# Patient Record
Sex: Female | Born: 1995 | Hispanic: Yes | Marital: Married | Smoking: Never smoker
Health system: Southern US, Community
[De-identification: ages and names within clinical notes are randomized; demographics above are authoritative.]

## PROBLEM LIST (undated history)

## (undated) DIAGNOSIS — R519 Headache, unspecified: Secondary | ICD-10-CM

## (undated) DIAGNOSIS — J45909 Unspecified asthma, uncomplicated: Secondary | ICD-10-CM

## (undated) DIAGNOSIS — K219 Gastro-esophageal reflux disease without esophagitis: Secondary | ICD-10-CM

## (undated) DIAGNOSIS — D649 Anemia, unspecified: Secondary | ICD-10-CM

## (undated) DIAGNOSIS — D696 Thrombocytopenia, unspecified: Secondary | ICD-10-CM

---
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
## 2016-02-06 LAB — OB RESULTS CONSOLE GC/CHLAMYDIA
CHLAMYDIA, DNA PROBE: NEGATIVE
Gonorrhea: NEGATIVE

## 2016-02-06 LAB — OB RESULTS CONSOLE GBS: GBS: NEGATIVE

## 2016-03-01 ENCOUNTER — Inpatient Hospital Stay: Payer: Medicaid Other | Admitting: Certified Registered Nurse Anesthetist

## 2016-03-01 ENCOUNTER — Encounter: Payer: Self-pay | Admitting: Obstetrics & Gynecology

## 2016-03-01 ENCOUNTER — Inpatient Hospital Stay
Admission: EM | Admit: 2016-03-01 | Discharge: 2016-03-04 | DRG: 766 | Disposition: A | Discharge status: Home / Self Care | Payer: Medicaid Other | Attending: Obstetrics & Gynecology | Admitting: Obstetrics & Gynecology

## 2016-03-01 ENCOUNTER — Encounter
Admission: EM | Disposition: A | Discharge status: Home / Self Care | Payer: Self-pay | Source: Home / Self Care | Attending: Obstetrics & Gynecology

## 2016-03-01 DIAGNOSIS — O9952 Diseases of the respiratory system complicating childbirth: Secondary | ICD-10-CM | POA: Diagnosis present

## 2016-03-01 DIAGNOSIS — O9902 Anemia complicating childbirth: Secondary | ICD-10-CM | POA: Diagnosis present

## 2016-03-01 DIAGNOSIS — D649 Anemia, unspecified: Secondary | ICD-10-CM | POA: Diagnosis present

## 2016-03-01 DIAGNOSIS — Z3A39 39 weeks gestation of pregnancy: Secondary | ICD-10-CM | POA: Diagnosis not present

## 2016-03-01 DIAGNOSIS — J45909 Unspecified asthma, uncomplicated: Secondary | ICD-10-CM | POA: Diagnosis present

## 2016-03-01 HISTORY — DX: Anemia, unspecified: D64.9

## 2016-03-01 LAB — CBC
HCT: 31.9 % — ABNORMAL LOW (ref 35.0–47.0)
Hemoglobin: 10.2 g/dL — ABNORMAL LOW (ref 12.0–16.0)
MCH: 25.4 pg — AB (ref 26.0–34.0)
MCHC: 32 g/dL (ref 32.0–36.0)
MCV: 79.2 fL — ABNORMAL LOW (ref 80.0–100.0)
PLATELETS: 208 10*3/uL (ref 150–440)
RBC: 4.02 MIL/uL (ref 3.80–5.20)
RDW: 15.4 % — ABNORMAL HIGH (ref 11.5–14.5)
WBC: 7.9 10*3/uL (ref 3.6–11.0)

## 2016-03-01 LAB — TYPE AND SCREEN
ABO/RH(D): O POS
Antibody Screen: NEGATIVE

## 2016-03-01 LAB — ABO/RH: ABO/RH(D): O POS

## 2016-03-01 LAB — CHLAMYDIA/NGC RT PCR (ARMC ONLY)
CHLAMYDIA TR: NOT DETECTED
N gonorrhoeae: NOT DETECTED

## 2016-03-01 SURGERY — Surgical Case
Anesthesia: Spinal

## 2016-03-01 MED ORDER — FERROUS SULFATE 325 (65 FE) MG PO TABS
325.0000 mg | ORAL_TABLET | Freq: Two times a day (BID) | ORAL | Status: DC
  Administered 2016-03-02 – 2016-03-04 (×5): 325 mg via ORAL
  Filled 2016-03-01 (×5): qty 1

## 2016-03-01 MED ORDER — BUPIVACAINE HCL (PF) 0.5 % IJ SOLN
5.0000 mL | Freq: Once | INTRAMUSCULAR | Status: DC
  Administered 2016-03-01: 5 mL

## 2016-03-01 MED ORDER — OXYTOCIN 10 UNIT/ML IJ SOLN
INTRAMUSCULAR | Status: AC
  Filled 2016-03-01: qty 2

## 2016-03-01 MED ORDER — AMMONIA AROMATIC IN INHA
RESPIRATORY_TRACT | Status: AC
  Filled 2016-03-01: qty 10

## 2016-03-01 MED ORDER — OXYCODONE-ACETAMINOPHEN 5-325 MG PO TABS
2.0000 | ORAL_TABLET | ORAL | Status: DC | PRN
  Administered 2016-03-03 – 2016-03-04 (×8): 2 via ORAL
  Filled 2016-03-01 (×8): qty 2

## 2016-03-01 MED ORDER — FENTANYL CITRATE (PF) 100 MCG/2ML IJ SOLN
INTRAMUSCULAR | Status: DC | PRN
  Administered 2016-03-01: 150 ug via INTRAVENOUS

## 2016-03-01 MED ORDER — WITCH HAZEL-GLYCERIN EX PADS: 1.0000 "application " | MEDICATED_PAD | CUTANEOUS | Status: DC | PRN

## 2016-03-01 MED ORDER — KETOROLAC TROMETHAMINE 30 MG/ML IJ SOLN: 30.0000 mg | Freq: Four times a day (QID) | INTRAMUSCULAR | Status: AC | PRN

## 2016-03-01 MED ORDER — SCOPOLAMINE 1 MG/3DAYS TD PT72
1.0000 | MEDICATED_PATCH | Freq: Once | TRANSDERMAL | Status: DC
  Administered 2016-03-02: 1.5 mg via TRANSDERMAL
  Filled 2016-03-01: qty 1

## 2016-03-01 MED ORDER — SENNOSIDES-DOCUSATE SODIUM 8.6-50 MG PO TABS
2.0000 | ORAL_TABLET | ORAL | Status: DC
  Administered 2016-03-02 – 2016-03-03 (×2): 2 via ORAL
  Filled 2016-03-01 (×2): qty 2

## 2016-03-01 MED ORDER — LACTATED RINGERS IV SOLN
INTRAVENOUS | Status: DC
  Administered 2016-03-01 (×3): via INTRAVENOUS

## 2016-03-01 MED ORDER — BUPIVACAINE 0.25 % ON-Q PUMP DUAL CATH 400 ML
400.0000 mL | INJECTION | Status: DC
  Filled 2016-03-01: qty 400

## 2016-03-01 MED ORDER — CITRIC ACID-SODIUM CITRATE 334-500 MG/5ML PO SOLN: 30.0000 mL | ORAL | Status: DC

## 2016-03-01 MED ORDER — OXYCODONE-ACETAMINOPHEN 5-325 MG PO TABS
1.0000 | ORAL_TABLET | ORAL | Status: DC | PRN
  Administered 2016-03-02: 1 via ORAL
  Filled 2016-03-01: qty 1

## 2016-03-01 MED ORDER — MISOPROSTOL 200 MCG PO TABS
ORAL_TABLET | ORAL | Status: AC
Start: 2016-03-01 — End: 2016-03-02
  Filled 2016-03-01: qty 4

## 2016-03-01 MED ORDER — BUPIVACAINE HCL (PF) 0.75 % IJ SOLN
INTRAMUSCULAR | Status: DC | PRN
  Administered 2016-03-01: 12 mg

## 2016-03-01 MED ORDER — NALBUPHINE HCL 10 MG/ML IJ SOLN
5.0000 mg | Freq: Once | INTRAMUSCULAR | Status: DC | PRN
Start: 2016-03-01 — End: 2016-03-01

## 2016-03-01 MED ORDER — MORPHINE SULFATE (PF) 0.5 MG/ML IJ SOLN
INTRAMUSCULAR | Status: DC | PRN
  Administered 2016-03-01: .2 mg via EPIDURAL

## 2016-03-01 MED ORDER — NALOXONE HCL 0.4 MG/ML IJ SOLN: 0.4000 mg | INTRAMUSCULAR | Status: DC | PRN

## 2016-03-01 MED ORDER — LACTATED RINGERS IV SOLN: 500.0000 mL | INTRAVENOUS | Status: DC | PRN

## 2016-03-01 MED ORDER — DEXTROSE 5 % IV SOLN
500.0000 mg | INTRAVENOUS | Status: AC
  Administered 2016-03-01: 500 mg via INTRAVENOUS
  Filled 2016-03-01: qty 500

## 2016-03-01 MED ORDER — IBUPROFEN 600 MG PO TABS
600.0000 mg | ORAL_TABLET | Freq: Four times a day (QID) | ORAL | Status: DC
  Administered 2016-03-02 – 2016-03-04 (×10): 600 mg via ORAL
  Filled 2016-03-01 (×10): qty 1

## 2016-03-01 MED ORDER — ONDANSETRON HCL 4 MG/2ML IJ SOLN: 4.0000 mg | Freq: Once | INTRAMUSCULAR | Status: DC | PRN

## 2016-03-01 MED ORDER — CITRIC ACID-SODIUM CITRATE 334-500 MG/5ML PO SOLN
ORAL | Status: AC
  Filled 2016-03-01: qty 15

## 2016-03-01 MED ORDER — SIMETHICONE 80 MG PO CHEW
80.0000 mg | CHEWABLE_TABLET | Freq: Three times a day (TID) | ORAL | Status: DC
  Administered 2016-03-02 – 2016-03-04 (×5): 80 mg via ORAL
  Filled 2016-03-01 (×5): qty 1

## 2016-03-01 MED ORDER — NALBUPHINE HCL 10 MG/ML IJ SOLN: 5.0000 mg | INTRAMUSCULAR | Status: DC | PRN

## 2016-03-01 MED ORDER — SODIUM CHLORIDE 0.9% FLUSH: 3.0000 mL | INTRAVENOUS | Status: DC | PRN

## 2016-03-01 MED ORDER — LACTATED RINGERS IV SOLN
INTRAVENOUS | Status: DC
  Administered 2016-03-01 – 2016-03-02 (×2): via INTRAVENOUS

## 2016-03-01 MED ORDER — DIPHENHYDRAMINE HCL 25 MG PO CAPS: 25.0000 mg | ORAL_CAPSULE | ORAL | Status: DC | PRN

## 2016-03-01 MED ORDER — OXYTOCIN BOLUS FROM INFUSION: 500.0000 mL | INTRAVENOUS | Status: DC

## 2016-03-01 MED ORDER — CEFAZOLIN SODIUM-DEXTROSE 2-4 GM/100ML-% IV SOLN
2.0000 g | INTRAVENOUS | Status: AC
  Administered 2016-03-01: 2 g via INTRAVENOUS

## 2016-03-01 MED ORDER — CEFAZOLIN SODIUM-DEXTROSE 2-4 GM/100ML-% IV SOLN
INTRAVENOUS | Status: AC
  Administered 2016-03-01: 2 g via INTRAVENOUS
  Filled 2016-03-01: qty 100

## 2016-03-01 MED ORDER — BUPIVACAINE HCL (PF) 0.25 % IJ SOLN
INTRAMUSCULAR | Status: DC | PRN
  Administered 2016-03-01 (×2): 4 mL via EPIDURAL

## 2016-03-01 MED ORDER — TERBUTALINE SULFATE 1 MG/ML IJ SOLN: 0.2500 mg | Freq: Once | INTRAMUSCULAR | Status: DC | PRN

## 2016-03-01 MED ORDER — BUPIVACAINE HCL 0.5 % IJ SOLN
INTRAMUSCULAR | Status: DC | PRN
  Administered 2016-03-01: 10 mL

## 2016-03-01 MED ORDER — FENTANYL CITRATE (PF) 100 MCG/2ML IJ SOLN
25.0000 ug | INTRAMUSCULAR | Status: DC | PRN
  Administered 2016-03-01: 25 ug via INTRAVENOUS
  Filled 2016-03-01: qty 2

## 2016-03-01 MED ORDER — COCONUT OIL OIL
1.0000 "application " | TOPICAL_OIL | Status: DC | PRN
  Administered 2016-03-02: 1 via TOPICAL
  Filled 2016-03-01: qty 120

## 2016-03-01 MED ORDER — BUPIVACAINE ON-Q PAIN PUMP (FOR ORDER SET NO CHG)
INJECTION | Status: DC
  Filled 2016-03-01: qty 1

## 2016-03-01 MED ORDER — FENTANYL 2.5 MCG/ML W/ROPIVACAINE 0.2% IN NS 100 ML EPIDURAL INFUSION (ARMC-ANES)
EPIDURAL | Status: DC | PRN
  Administered 2016-03-01: 9 mL/h via EPIDURAL

## 2016-03-01 MED ORDER — OXYTOCIN 10 UNIT/ML IJ SOLN
40.0000 [IU] | INTRAVENOUS | Status: DC | PRN
  Administered 2016-03-01: 20:00:00 via INTRAVENOUS

## 2016-03-01 MED ORDER — FENTANYL 2.5 MCG/ML W/ROPIVACAINE 0.2% IN NS 100 ML EPIDURAL INFUSION (ARMC-ANES): 10.0000 mL/h | EPIDURAL | Status: DC

## 2016-03-01 MED ORDER — KETOROLAC TROMETHAMINE 30 MG/ML IJ SOLN
30.0000 mg | Freq: Four times a day (QID) | INTRAMUSCULAR | Status: AC | PRN
  Administered 2016-03-01 – 2016-03-02 (×2): 30 mg via INTRAVENOUS
  Filled 2016-03-01 (×2): qty 1

## 2016-03-01 MED ORDER — MENTHOL 3 MG MT LOZG: 1.0000 | LOZENGE | OROMUCOSAL | Status: DC | PRN

## 2016-03-01 MED ORDER — BUPIVACAINE IN DEXTROSE 0.75-8.25 % IT SOLN
INTRATHECAL | Status: DC | PRN
  Administered 2016-03-01: 12 mL via INTRATHECAL

## 2016-03-01 MED ORDER — OXYTOCIN 40 UNITS IN LACTATED RINGERS INFUSION - SIMPLE MED
1.0000 m[IU]/min | INTRAVENOUS | Status: DC
  Administered 2016-03-01: 1 m[IU]/min via INTRAVENOUS

## 2016-03-01 MED ORDER — LIDOCAINE HCL (PF) 1 % IJ SOLN: 30.0000 mL | INTRAMUSCULAR | Status: DC | PRN

## 2016-03-01 MED ORDER — BUPIVACAINE HCL (PF) 0.5 % IJ SOLN
5.0000 mL | Freq: Once | INTRAMUSCULAR | Status: DC
  Filled 2016-03-01: qty 30

## 2016-03-01 MED ORDER — DIPHENHYDRAMINE HCL 50 MG/ML IJ SOLN: 12.5000 mg | INTRAMUSCULAR | Status: DC | PRN

## 2016-03-01 MED ORDER — OXYTOCIN 40 UNITS IN LACTATED RINGERS INFUSION - SIMPLE MED: 2.5000 [IU]/h | INTRAVENOUS | Status: AC

## 2016-03-01 MED ORDER — BUTORPHANOL TARTRATE 1 MG/ML IJ SOLN
INTRAMUSCULAR | Status: AC
  Administered 2016-03-01: 1 mg via INTRAVENOUS
  Filled 2016-03-01: qty 1

## 2016-03-01 MED ORDER — BUTORPHANOL TARTRATE 1 MG/ML IJ SOLN
1.0000 mg | INTRAMUSCULAR | Status: DC
  Administered 2016-03-01: 1 mg via INTRAVENOUS

## 2016-03-01 MED ORDER — NALOXONE HCL 2 MG/2ML IJ SOSY
1.0000 ug/kg/h | PREFILLED_SYRINGE | INTRAVENOUS | Status: DC | PRN
  Filled 2016-03-01: qty 2

## 2016-03-01 MED ORDER — MEPERIDINE HCL 25 MG/ML IJ SOLN: 6.2500 mg | INTRAMUSCULAR | Status: DC | PRN

## 2016-03-01 MED ORDER — LIDOCAINE HCL (PF) 1 % IJ SOLN
INTRAMUSCULAR | Status: AC
Start: 2016-03-01 — End: 2016-03-01
  Administered 2016-03-01: 3 mL via INTRADERMAL
  Filled 2016-03-01: qty 30

## 2016-03-01 MED ORDER — PRENATAL MULTIVITAMIN CH
1.0000 | ORAL_TABLET | Freq: Every day | ORAL | Status: DC
  Administered 2016-03-02 – 2016-03-04 (×3): 1 via ORAL
  Filled 2016-03-01 (×3): qty 1

## 2016-03-01 MED ORDER — ACETAMINOPHEN 325 MG PO TABS: 650.0000 mg | ORAL_TABLET | ORAL | Status: DC | PRN

## 2016-03-01 MED ORDER — DIPHENHYDRAMINE HCL 25 MG PO CAPS: 25.0000 mg | ORAL_CAPSULE | Freq: Four times a day (QID) | ORAL | Status: DC | PRN

## 2016-03-01 MED ORDER — NALBUPHINE HCL 10 MG/ML IJ SOLN: 5.0000 mg | Freq: Once | INTRAMUSCULAR | Status: DC | PRN

## 2016-03-01 MED ORDER — CITRIC ACID-SODIUM CITRATE 334-500 MG/5ML PO SOLN: 30.0000 mL | ORAL | Status: DC | PRN

## 2016-03-01 MED ORDER — DIBUCAINE 1 % RE OINT: 1.0000 "application " | TOPICAL_OINTMENT | RECTAL | Status: DC | PRN

## 2016-03-01 MED ORDER — OXYTOCIN 40 UNITS IN LACTATED RINGERS INFUSION - SIMPLE MED
2.5000 [IU]/h | INTRAVENOUS | Status: DC
  Administered 2016-03-01: 800 mL via INTRAVENOUS
  Filled 2016-03-01: qty 1000

## 2016-03-01 MED ORDER — LIDOCAINE-EPINEPHRINE (PF) 1.5 %-1:200000 IJ SOLN
INTRAMUSCULAR | Status: DC | PRN
  Administered 2016-03-01: 3 mL via PERINEURAL

## 2016-03-01 MED ORDER — FENTANYL 2.5 MCG/ML W/ROPIVACAINE 0.2% IN NS 100 ML EPIDURAL INFUSION (ARMC-ANES)
EPIDURAL | Status: AC
  Administered 2016-03-01: 250 ug
  Filled 2016-03-01: qty 100

## 2016-03-01 MED ORDER — ONDANSETRON HCL 4 MG/2ML IJ SOLN
4.0000 mg | Freq: Three times a day (TID) | INTRAMUSCULAR | Status: DC | PRN
  Administered 2016-03-02: 4 mg via INTRAVENOUS
  Filled 2016-03-01: qty 2

## 2016-03-01 MED ORDER — ONDANSETRON HCL 4 MG/2ML IJ SOLN
4.0000 mg | Freq: Four times a day (QID) | INTRAMUSCULAR | Status: DC | PRN
  Administered 2016-03-01: 8 mg via INTRAVENOUS

## 2016-03-01 MED ORDER — MEASLES, MUMPS & RUBELLA VAC ~~LOC~~ INJ: 0.5000 mL | INJECTION | SUBCUTANEOUS | Status: DC | PRN

## 2016-03-01 SURGICAL SUPPLY — 30 items
CANISTER SUCT 3000ML (MISCELLANEOUS) ×2 IMPLANT
CATH KIT ON-Q SILVERSOAK 5IN (CATHETERS) ×4 IMPLANT
DRESSING SURGICEL FIBRLLR 1X2 (HEMOSTASIS) ×1 IMPLANT
DRSG OPSITE POSTOP 4X10 (GAUZE/BANDAGES/DRESSINGS) IMPLANT
DRSG SURGICEL FIBRILLAR 1X2 (HEMOSTASIS) ×2
DRSG TELFA 3X8 NADH (GAUZE/BANDAGES/DRESSINGS) ×2 IMPLANT
ELECT CAUTERY BLADE 6.4 (BLADE) IMPLANT
ELECT REM PT RETURN 9FT ADLT (ELECTROSURGICAL) ×2
ELECTRODE REM PT RTRN 9FT ADLT (ELECTROSURGICAL) ×1 IMPLANT
GAUZE SPONGE 4X4 12PLY STRL (GAUZE/BANDAGES/DRESSINGS) ×2 IMPLANT
GLOVE BIO SURGEON STRL SZ7 (GLOVE) ×6 IMPLANT
GLOVE INDICATOR 7.5 STRL GRN (GLOVE) ×6 IMPLANT
GOWN STRL REUS W/ TWL LRG LVL3 (GOWN DISPOSABLE) ×3 IMPLANT
GOWN STRL REUS W/TWL LRG LVL3 (GOWN DISPOSABLE) ×3
LIQUID BAND (GAUZE/BANDAGES/DRESSINGS) ×2 IMPLANT
NS IRRIG 1000ML POUR BTL (IV SOLUTION) ×2 IMPLANT
PACK C SECTION AR (MISCELLANEOUS) ×2 IMPLANT
PAD OB MATERNITY 4.3X12.25 (PERSONAL CARE ITEMS) ×2 IMPLANT
PAD PREP 24X41 OB/GYN DISP (PERSONAL CARE ITEMS) ×2 IMPLANT
SPONGE LAP 18X18 5 PK (GAUZE/BANDAGES/DRESSINGS) IMPLANT
STRIP CLOSURE SKIN 1/2X4 (GAUZE/BANDAGES/DRESSINGS) ×2 IMPLANT
SUT CHROMIC GUT BROWN 0 54 (SUTURE) ×1 IMPLANT
SUT CHROMIC GUT BROWN 0 54IN (SUTURE) ×2
SUT MNCRL 4-0 (SUTURE) ×1
SUT MNCRL 4-0 27XMFL (SUTURE) ×1
SUT PDS AB 1 TP1 96 (SUTURE) IMPLANT
SUT PLAIN 2 0 XLH (SUTURE) ×2 IMPLANT
SUT VIC AB 0 CT1 36 (SUTURE) ×8 IMPLANT
SUTURE MNCRL 4-0 27XMF (SUTURE) ×1 IMPLANT
SWABSTK COMLB BENZOIN TINCTURE (MISCELLANEOUS) IMPLANT

## 2016-03-01 NOTE — Op Note (Signed)
Cesarean Section Procedure Note   Nicole FossaCecilia Hartman   03/01/2016   Pre-operative Diagnosis:  1) intrauterine pregnancy at 7333w5d  2) Second stage arrest  Post-operative Diagnosis:  1) intrauterine pregnancy at 9833w5d  2) Second stage arrest  Procedure: Primary Low Transverse Cesarean Section via Pfannenstiel Incision with double layer uterine closure  Surgeon: Surgeon(s) and Role:    * Conard NovakStephen D Collyn Selk, MD - Primary   Anesthesia: spinal   Findings:  1) normal appearing gravid uterus, fallopian tubes, and ovaries 2) Viable female infant with APGARs of 5 at 1 minutes and 9 at 5 minutes with weight 3,140 grams (6lb 15 oz)   Estimated Blood Loss: 800 mL  Total IV Fluids: 1,200 ml   Urine Output: 150 mL pink-tinged but clear urine at end of case  Specimens: None  Complications: no complications  Disposition: PACU - hemodynamically stable.   Maternal Condition: stable   Baby condition / location:  Couplet care / Skin to Skin  Procedure Details:  The patient was seen in the Holding Room. The risks, benefits, complications, treatment options, and expected outcomes were discussed with the patient. The patient concurred with the proposed plan, giving informed consent. identified as Nicole Hartman and the procedure verified as C-Section Delivery. A Time Out was held and the above information confirmed.   After induction of anesthesia, the patient was prepped and draped (including a vaginal prep with betadine) in the usual sterile manner. A Pfannenstiel incision was made and carried down through the subcutaneous tissue to the fascia. Fascial incision was made and extended transversely. The fascia was separated from the underlying rectus tissue superiorly and inferiorly. The peritoneum was identified and entered. Peritoneal incision was extended longitudinally. The bladder flap was sharply and bluntly freed from the lower uterine segment. A low transverse uterine incision was made and the  hysterotomy was extended with cranial-caudal tension. With a push up from below, delivered from cephalic presentation was a 3,140 gram Living newborn infant(s) or Female with Apgar scores of 5 at one minute and 9 at five minutes. Cord ph was not sent the umbilical cord was clamped and cut cord blood was obtained for evaluation. The placenta was removed Intact and appeared normal. The uterine outline, tubes and ovaries appeared normal. The uterine incision was closed with running locked sutures of 0 Vicryl.  A second layer of the same suture was thrown in an imbricating fashion.  Hemostasis was assured.  The uterus was returned to the abdomen and the paracolic gutters were cleared of all clots and debris.  Fibrillar was placed along the lower uterine segment to assure continued hemostasis. The peritoneum was reapproximated using 0-Vicryl in a running fashion.  The rectus muscles were inspected and found to be hemostatic.  The On-Q catheter pumps were inserted in accordance with the manufacturer's recommendations.  The catheters were inserted approximately 4cm cephelad to the incision line, approximately 1cm apart, straddling the midline.  They were inserted to a depth of the 4th mark. They were positioned superficial to the rectus abdominus muscles and deep to the rectus fascia.    The fascia was then reapproximated with running sutures of 1-0 PDS, looped. Three interrupted 3-0 Vicryl stitches were placed to reinforce the skin closure.  The subcuticular closure was performed using 4-0 monocryl. The skin closure was reinforced using surgical skin glue.  The On-Q catheters were bolused with 5 mL of 0.5% marcaine plain for a total of 10 mL.  The catheters were affixed to the skin  with surgical skin glue, steri-strips, and tegaderm.    Instrument, sponge, and needle counts were correct prior the abdominal closure and were correct at the conclusion of the case.  The patient received Ancef 2 gram IV and Azithromycin  500 mg IV  prior to skin incision (within 30 minutes). For VTE prophylaxis she was wearing SCDs throughout the case.   Signed: Conard Novak, MD 03/01/2016 8:37 PM

## 2016-03-01 NOTE — Progress Notes (Signed)
  Labor Progress Note   20 y.o. G1P0 @ 5329w5d , admitted for  Pregnancy, Labor Management. SROM  Subjective:  Pt states contractions are feeling stronger. She has had some relief from IV pain med and is considering an epidural.  Objective:  BP 127/88 mmHg  Pulse 97  Temp(Src) 97.7 F (36.5 C) (Oral)  Ht 5' (1.524 m)  Wt 67.132 kg (148 lb)  BMI 28.90 kg/m2  LMP 05/28/2015 (Exact Date) Abd: mild Extr: trace to 1+ bilateral pedal edema SVE: CERVIX: 4 cm dilated, 90 effaced, -2 station  EFM: FHR: 135 bpm, variability: moderate,  accelerations:  Present,  decelerations:  Present variable Toco: Frequency: Every 4-6 minutes Labs: I have reviewed the patient's lab results.   Assessment & Plan:  G1P0 @ 229w5d, admitted for  Pregnancy and Labor/Delivery Management  1. Pain management: epidural and IV stadol. 2. FWB: FHT category II.  3. ID: GBS negative 4. Labor management: Initiate Pitocin labor augmentation All discussed with patient, see orders  Lauralie Blacksher, CNM

## 2016-03-01 NOTE — Anesthesia Procedure Notes (Addendum)
Epidural Patient location during procedure: OB Start time: 03/01/2016 12:45 PM End time: 03/01/2016 12:58 PM  Staffing Anesthesiologist: Yevette EdwardsADAMS, JAMES G Resident/CRNA: Ginger CarneMICHELET, STEPHANIE Performed by: resident/CRNA   Preanesthetic Checklist Completed: patient identified, site marked, surgical consent, pre-op evaluation, timeout performed, IV checked, risks and benefits discussed and monitors and equipment checked  Epidural Patient position: sitting Prep: Betadine Patient monitoring: heart rate, continuous pulse ox and blood pressure Approach: midline Location: L4-L5 Injection technique: LOR saline  Needle:  Needle type: Tuohy  Needle gauge: 17 G Needle length: 9 cm and 9 Needle insertion depth: 9 cm Catheter type: closed end flexible Catheter size: 19 Gauge Catheter at skin depth: 12 cm Test dose: negative and 1.5% lidocaine with Epi 1:200 K  Assessment Sensory level: T8 Events: blood not aspirated, injection not painful, no injection resistance, negative IV test and no paresthesia  Additional Notes   Patient tolerated the insertion well without immediate complications.Reason for block:procedure for pain  Spinal Patient location during procedure: OR Staffing Anesthesiologist: Berdine AddisonHOMAS, Mariateresa Batra Performed by: anesthesiologist  Preanesthetic Checklist Completed: patient identified, site marked, surgical consent, pre-op evaluation, timeout performed, IV checked and risks and benefits discussed Spinal Block Patient position: sitting Prep: Betadine Patient monitoring: heart rate, cardiac monitor, continuous pulse ox and blood pressure Approach: midline Location: L3-4 Injection technique: single-shot Needle Needle type: Pencil-Tip  Needle gauge: 25 G Needle length: 9 cm Assessment Sensory level: T10 Additional Notes 1923 marcaine 12 mg and 0.2 mg astromorph.

## 2016-03-01 NOTE — Progress Notes (Signed)
Pushed with patient for over 45 minutes.  Good effort the entire time and zero movement of fetal station over this entire pushing.  She has pushed for a total of just over 3 hours.  There is fetal caput. Given arrest of second stage, will proceed to cesarean delivery. Will move to urgent delivery given the repetitive variable decelerations of the fetal heart rate.  Currently category 1 wih intermitent category 2.  Patient agrees to move forward with cesarean delivery. I personally consented patient regarding risks of surgery.    Thomasene MohairStephen Jackson, MD 03/01/2016 6:43 PM

## 2016-03-01 NOTE — Progress Notes (Signed)
  Labor Progress Note   20 y.o. G1P0 @ 5968w5d , admitted for  Pregnancy, Labor Management. SROM/augmentation of labor  Subjective:  Called to patient room for complete/complete/+1 and deep variables with contractions. Feeling some pressure but unable to focus on pushing/teary and says she can't push  Objective:  BP 122/83 mmHg  Pulse 84  Temp(Src) 98.2 F (36.8 C) (Oral)  Ht 5' (1.524 m)  Wt 67.132 kg (148 lb)  BMI 28.90 kg/m2  SpO2 100%  LMP 05/28/2015 (Exact Date) Abd: mild Extr: trace to 1+ bilateral pedal edema SVE: CERVIX: 10 cm dilated, 100 effaced, +1 station  EFM: FHR: 135 bpm, variability: moderate,  accelerations:  Absent,  decelerations:  Present variables with contractions, some deep to 60's. Heart rate improved with mild variables following turning pitocin off and repositioning pt Toco: Frequency: Every 1-2 minutes Labs: I have reviewed the patient's lab results. Trial of pushing with UCs: minimal effort/ineffective pushes   Assessment & Plan:  G1P0 @ 10168w5d, admitted for  Pregnancy and Labor/Delivery Management  1. Pain management: epidural. 2. FWB: FHT category II.  3. ID: GBS negative 4. Labor management: Pitocin off, turn down epidural, labor down All discussed with patient, see orders  Tinley Rought, CNM

## 2016-03-01 NOTE — Discharge Summary (Signed)
OB Discharge Summary  Patient Name: Nicole Hartman DOB: Nov 15, 1995 MRN: [id number]  Date of admission: 03/01/2016 Delivering MD: Thomasene Mohair, MD Date of Delivery: 03/01/2016  Date of discharge: 03/04/16  Admitting diagnosis:  1) Spontaneous Rupture of membranes 2) Second Stage arrest 3) intrauterine pregnancy at [redacted]w[redacted]d   Intrauterine pregnancy: [redacted]w[redacted]d      Secondary diagnosis: None     Discharge diagnosis: Term Pregnancy Delivered                                                                                                Post partum procedures:none  Augmentation: Pitocin  Complications: None  Hospital course:  Onset of Labor With Unplanned C/S  20 y.o. yo G1P1000 at [redacted]w[redacted]d was admitted in Latent Labor on 03/01/2016. Patient had a labor course significant for deep second stage arrest. Membrane Rupture Time/Date: 7:50 AM ,03/01/2016   The patient went for cesarean section due to second stage arrest, and delivered a Viable infant,03/01/2016  Details of operation can be found in separate operative note. Patient had an uncomplicated postpartum course.  She is ambulating,tolerating a regular diet, passing flatus, and urinating well.  Patient is discharged home in stable condition 03/01/2016.  Physical exam  Filed Vitals:   03/03/16 2200 03/04/16 0002 03/04/16 0400 03/04/16 0901  BP: 116/59   122/68  Pulse: 78   80  Temp: 98.5 F (36.9 C) 98.2 F (36.8 C) 98.5 F (36.9 C) 98.4 F (36.9 C)  TempSrc: Oral Oral Oral   Resp: 16     Height:      Weight:      SpO2:       General: alert and cooperative Lochia: appropriate Uterine Fundus: firm Incision: Dressing is clean, dry, and intact - RN to remove today DVT Evaluation: No evidence of DVT seen on physical exam.  Labs: Lab Results  Component Value Date   WBC 7.9 03/01/2016   HGB 10.2* 03/01/2016   HCT 31.9* 03/01/2016   MCV 79.2* 03/01/2016   PLT 208 03/01/2016   CMP Latest Ref Rng 02/09/2013  Glucose 65-99 mg/dL 74   BUN 7-82 mg/dL 12  Creatinine 9.56-2.13 mg/dL 0.86  Sodium 578-469 mmol/L 139  Potassium 3.3-4.7 mmol/L 3.7  Chloride 97-107 mmol/L 108(H)  CO2 16-25 mmol/L 24  Calcium 9.0-10.7 mg/dL 6.2(X)  Total Protein 5.2-8.4 g/dL 8.2  Total Bilirubin 1.3-2.4 mg/dL 0.2  Alkaline Phos 40-102 Unit/L 62(L)  AST 0-26 Unit/L 25  ALT 12-78 U/L 19   Discharge instruction: per After Visit Summary.  Medications:    Medication List    STOP taking these medications        clindamycin 1 % lotion  Commonly known as:  CLEOCIN T      TAKE these medications        ARIPiprazole 5 MG tablet  Commonly known as:  ABILIFY  Take 0.5 tablets (2.5 mg total) by mouth daily.     ferrous sulfate 325 (65 FE) MG tablet  Take 1 tablet (325 mg total) by mouth daily.     fluticasone 50 MCG/ACT nasal spray  Commonly known as:  FLONASE  Place 1 spray into the nose daily. Patient may resume home supply     ibuprofen 600 MG tablet  Commonly known as:  ADVIL,MOTRIN  Take 1 tablet (600 mg total) by mouth every 6 (six) hours.     mirtazapine 15 MG tablet  Commonly known as:  REMERON  Take 1 tablet (15 mg total) by mouth at bedtime.     norethindrone 0.35 MG tablet  Commonly known as:  ORTHO MICRONOR  Take 1 tablet (0.35 mg total) by mouth daily.     oxyCODONE-acetaminophen 5-325 MG tablet  Commonly known as:  PERCOCET/ROXICET  Take 1-2 tablets by mouth every 4 (four) hours as needed for moderate pain (pain score 4-7/10).     albuterol 108 (90 Base) MCG/ACT inhaler  Commonly known as:  PROVENTIL HFA;VENTOLIN HFA  Inhale 2 puffs into the lungs every 6 (six) hours as needed for wheezing or shortness of breath. Patient may resume home supply.     PROAIR HFA 108 (90 Base) MCG/ACT inhaler  Generic drug:  albuterol  Inhale 1 puff into the lungs 3 (three) times daily as needed for wheezing (for cough).         Diet: routine diet  Activity: Advance as tolerated. Pelvic rest for 6 weeks.   Outpatient  follow up: Follow-up Information    Follow up with Conard NovakJackson, Stephen D, MD. Go on 03/07/2016.   Specialty:  Obstetrics and Gynecology   Why:  For wound re-check - Thursday 5/25 at 8:40 am   Contact information:   [address] [place] [address] [phone]      Postpartum contraception: Progesterone only pills Rhogam Given postpartum: n/a Rubella vaccine given postpartum: yes Varicella vaccine given postpartum: no TDaP given antepartum or postpartum: TDaP Up to date  Newborn Data: Live born female  Birth Weight: 6 lb 14.8 oz (3140 g) APGAR: 5, 9  Baby Feeding: breast & bottle  Disposition:home with mother  SIGNED: Marja Kays. Camreigh Michie, CNM

## 2016-03-01 NOTE — Transfer of Care (Signed)
Immediate Anesthesia Transfer of Care Note  Patient: Nicole Hartman  Procedure(s) Performed: Procedure(s): CESAREAN SECTION (N/A)  Patient Location: PACU and Mother/Baby  Anesthesia Type:Spinal  Level of Consciousness: awake, alert , oriented and sedated  Airway & Oxygen Therapy: Patient Spontanous Breathing  Post-op Assessment: Report given to RN and Post -op Vital signs reviewed and stable  Post vital signs: Reviewed and stable  Last Vitals:  Filed Vitals:   03/01/16 1615 03/01/16 1700  BP:    Pulse:    Temp: 36.7 C 36.8 C    Last Pain:  Filed Vitals:   03/01/16 2004  PainSc: 7          Complications: No apparent anesthesia complications

## 2016-03-01 NOTE — Anesthesia Preprocedure Evaluation (Addendum)
Anesthesia Evaluation  Patient identified by MRN, date of birth, ID band Patient awake    Reviewed: Allergy & Precautions, H&P , NPO status , Patient's Chart, lab work & pertinent test results, reviewed documented beta blocker date and time   History of Anesthesia Complications Negative for: history of anesthetic complications  Airway Mallampati: II  TM Distance: >3 FB Neck ROM: full    Dental no notable dental hx. (+) Teeth Intact   Pulmonary neg pulmonary ROS,    Pulmonary exam normal breath sounds clear to auscultation       Cardiovascular Exercise Tolerance: Good negative cardio ROS Normal cardiovascular exam Rhythm:regular Rate:Normal     Neuro/Psych PSYCHIATRIC DISORDERS Depression negative neurological ROS  negative psych ROS   GI/Hepatic negative GI ROS, Neg liver ROS,   Endo/Other  negative endocrine ROS  Renal/GU negative Renal ROS  negative genitourinary   Musculoskeletal   Abdominal   Peds  Hematology negative hematology ROS (+) anemia ,   Anesthesia Other Findings   Reproductive/Obstetrics (+) Pregnancy                           Anesthesia Physical Anesthesia Plan  ASA: II  Anesthesia Plan: Spinal   Post-op Pain Management:    Induction:   Airway Management Planned:   Additional Equipment:   Intra-op Plan:   Post-operative Plan:   Informed Consent: I have reviewed the patients History and Physical, chart, labs and discussed the procedure including the risks, benefits and alternatives for the proposed anesthesia with the patient or authorized representative who has indicated his/her understanding and acceptance.     Plan Discussed with: Anesthesiologist  Anesthesia Plan Comments:        Anesthesia Quick Evaluation

## 2016-03-01 NOTE — H&P (Signed)
OB History & Physical   History of Present Illness:  Chief Complaint:   HPI:  Nicole Hartman is a 20 y.o. G1P0 female at 9061w5d dated by LMP and 6 week US.  She presents to L&D with spontaneous ROM @ 7am, clear fluid.    +FM, + CTX, + LOF, no VB  Pregnancy Issues: 1. Anemia 2. Teen pregnancy 3. History of anxiety and depression 4. Rubella non-immune 5. Asthma  Maternal Medical History:   Past Medical History  Diagnosis Date  . Asthma   . Anemia     No past surgical history on file.  No Known Allergies  Prior to Admission medications   Medication Sig Start Date End Date Taking? Authorizing Provider  albuterol (PROAIR HFA) 108 (90 BASE) MCG/ACT inhaler Inhale 1 puff into the lungs 3 (three) times daily as needed for wheezing (for cough).    Historical Provider, MD  albuterol (PROVENTIL HFA;VENTOLIN HFA) 108 (90 BASE) MCG/ACT inhaler Inhale 2 puffs into the lungs every 6 (six) hours as needed for wheezing or shortness of breath. Patient may resume home supply. 01/13/13   Jolene SchimkeKim B Winson, NP  ARIPiprazole (ABILIFY) 5 MG tablet Take 0.5 tablets (2.5 mg total) by mouth daily. 01/13/13   Jolene SchimkeKim B Winson, NP  clindamycin (CLEOCIN T) 1 % lotion Apply 1 application topically daily. Patient may resume home supply 01/13/13   Jolene SchimkeKim B Winson, NP  fluticasone (FLONASE) 50 MCG/ACT nasal spray Place 1 spray into the nose daily. Patient may resume home supply 01/13/13   Jolene SchimkeKim B Winson, NP  ibuprofen (ADVIL,MOTRIN) 200 MG tablet Take 1 tablet (200 mg total) by mouth every 6 (six) hours as needed for pain or headache. Patient may resume home supply. 01/13/13   Jolene SchimkeKim B Winson, NP  mirtazapine (REMERON) 15 MG tablet Take 1 tablet (15 mg total) by mouth at bedtime. 01/13/13   Jolene SchimkeKim B Winson, NP     [facility]   Social History: She  reports that she has never smoked. She has never used smokeless tobacco. She reports that she does not drink alcohol or use illicit drugs.  Family History: family  history is not on file.   Review of Systems: Negative x 10 systems reviewed except as noted in the HPI.     Physical Exam:  Vital Signs: LMP 05/28/2015 (Exact Date) General: no acute distress.  HEENT: normocephalic, atraumatic Heart: regular rate & rhythm.  No murmurs/rubs/gallops Lungs: clear to auscultation bilaterally, normal respiratory effort Abdomen: soft, gravid, non-tender;  EFW: 7.5 Pelvic:   External: Normal external female genitalia  Cervix: Dilation: 3 / Effacement (%): 80 / Station: -2    Extremities: non-tender, symmetric, 1+ edema bilaterally.  DTRs: 2+ Neurologic: Alert & oriented x 3.    No results found for this or any previous visit (from the past 24 hour(s)).  Pertinent Results:  Prenatal Labs: Blood type/Rh O+  Antibody screen neg  Rubella Non-Immune  Varicella Immune  RPR NR  HBsAg Neg  HIV NR  GC neg  Chlamydia neg  Genetic screening negative  1 hour GTT 124  3 hour GTT --  GBS negative   FHT: 135 mod + accels no decels TOCO: q2-794min SVE: 3/80/-2   Cephalic by leopolds  Assessment:  Nicole Hartman is a 20 y.o. G1P0 female at 6461w5d with SROM.   Plan:  1. Admit to Labor & Delivery 2. CBC, T&S, Clrs, IVF 3. GBS neg, no abx needed 4. Consents obtained. 5. Continuous efm/toco  6. Epidural upon request  ----- Ranae Plumber, MD Attending Obstetrician and Gynecologist [facility]

## 2016-03-01 NOTE — Progress Notes (Signed)
Pushed with pt from 1600 to 1730 with maternal effort improving but without making progress. Vaginal swelling and caput increasing. FHTs baseline 140s with moderate variability. Variables continue with most UCs. Good response to scalp stimulation. Toco continues at q 2 to 3 minutes. MD Jean RosenthalJackson consulted regarding pts progress.   Tresea MallGLEDHILL,Gabrielle Mester, CNM

## 2016-03-02 LAB — CBC
HEMATOCRIT: 25.5 % — AB (ref 35.0–47.0)
HEMOGLOBIN: 8.2 g/dL — AB (ref 12.0–16.0)
MCH: 25.7 pg — ABNORMAL LOW (ref 26.0–34.0)
MCHC: 32.3 g/dL (ref 32.0–36.0)
MCV: 79.4 fL — AB (ref 80.0–100.0)
Platelets: 161 10*3/uL (ref 150–440)
RBC: 3.21 MIL/uL — ABNORMAL LOW (ref 3.80–5.20)
RDW: 15.3 % — AB (ref 11.5–14.5)
WBC: 8.6 10*3/uL (ref 3.6–11.0)

## 2016-03-02 LAB — RPR: RPR Ser Ql: NONREACTIVE

## 2016-03-02 NOTE — Progress Notes (Signed)
Peri care and ice pads

## 2016-03-02 NOTE — Anesthesia Postprocedure Evaluation (Signed)
Anesthesia Post Note  Patient: Nicole Hartman  Procedure(s) Performed: Procedure(s) (LRB): CESAREAN SECTION (N/A)  Patient location during evaluation: Mother Baby Anesthesia Type: Spinal Level of consciousness: awake and alert Pain management: pain level controlled Vital Signs Assessment: post-procedure vital signs reviewed and stable Respiratory status: spontaneous breathing, nonlabored ventilation and respiratory function stable Cardiovascular status: stable Postop Assessment: no headache, no backache and patient able to bend at knees Anesthetic complications: no    Last Vitals:  Filed Vitals:   03/02/16 0440 03/02/16 0817  BP: 112/76 113/68  Pulse: 72 80  Temp: 36.8 C 36.7 C  Resp: 18 20    Last Pain:  Filed Vitals:   03/02/16 0951  PainSc: 2                  Cleda MccreedyJoseph K Dennise Raabe

## 2016-03-02 NOTE — Anesthesia Post-op Follow-up Note (Signed)
  Anesthesia Pain Follow-up Note  Patient: Nicole Hartman  Day #: 1  Date of Follow-up: 03/02/2016 Time: 12:45 PM  Last Vitals:  Filed Vitals:   03/02/16 0440 03/02/16 0817  BP: 112/76 113/68  Pulse: 72 80  Temp: 36.8 C 36.7 C  Resp: 18 20    Level of Consciousness: alert  Pain: mild   Side Effects:None  Catheter Site Exam:clean, dry  Plan: D/C from anesthesia care  Cleda MccreedyJoseph K Quame Spratlin

## 2016-03-02 NOTE — Progress Notes (Signed)
Admit Date: 03/01/2016 Today's Date: 03/02/2016  Subjective: Postpartum Day 1: Cesarean Delivery Patient reports incisional pain.    Objective: Vital signs in last 24 hours: Temp:  [97.2 F (36.2 C)-98.4 F (36.9 C)] 98 F (36.7 C) (05/20 0817) Pulse Rate:  [63-93] 80 (05/20 0817) Resp:  [16-20] 20 (05/20 0817) BP: (112-140)/(68-93) 113/68 mmHg (05/20 0817) SpO2:  [98 %-100 %] 100 % (05/20 0440)  Physical Exam:  General: alert, cooperative and no distress Lochia: appropriate Uterine Fundus: firm Incision: dressing dry DVT Evaluation: No evidence of DVT seen on physical exam.   Recent Labs  03/01/16 0920 03/02/16 0612  HGB 10.2* 8.2*  HCT 31.9* 25.5*    Assessment/Plan: Status post Cesarean section. Doing well postoperatively.  Continue current care. Ambulate, advance diet Rubella pp Fe, PNV  Letitia LibraRobert Paul Jamorian Dimaria 03/02/2016, 10:58 AM

## 2016-03-03 NOTE — Progress Notes (Signed)
Admit Date: 03/01/2016 Today's Date: 03/03/2016  Subjective: Postpartum Day 2: Cesarean Delivery Patient reports minor incisional pain. Pumping for breast milk.  Infant has jaundice.    Objective: Vital signs in last 24 hours: Temp:  [98.1 F (36.7 C)-99 F (37.2 C)] 98.1 F (36.7 C) (05/21 0805) Pulse Rate:  [75-84] 75 (05/21 0805) Resp:  [18-20] 18 (05/21 0805) BP: (103-125)/(60-78) 114/74 mmHg (05/21 0805) SpO2:  [99 %-100 %] 100 % (05/21 0805)  Physical Exam:  General: alert, cooperative and no distress Lochia: appropriate Uterine Fundus: firm Incision: dressing dry DVT Evaluation: No evidence of DVT seen on physical exam.   Recent Labs  03/01/16 0920 03/02/16 0612  HGB 10.2* 8.2*  HCT 31.9* 25.5*    Assessment/Plan: Status post Cesarean section. Doing well postoperatively.  Continue current care. Ambulate, advance diet Rubella pp Fe, PNV Infant has jaundice and needs to stay another day  Nicole Hartman 03/03/2016, 10:22 AM

## 2016-03-04 ENCOUNTER — Encounter: Payer: Self-pay | Admitting: Obstetrics and Gynecology

## 2016-03-04 MED ORDER — NORETHINDRONE 0.35 MG PO TABS: 1.0000 | ORAL_TABLET | Freq: Every day | ORAL | Status: DC

## 2016-03-04 MED ORDER — IBUPROFEN 600 MG PO TABS: 600.0000 mg | ORAL_TABLET | Freq: Four times a day (QID) | ORAL | Status: DC

## 2016-03-04 MED ORDER — FERROUS SULFATE 325 (65 FE) MG PO TABS: 325.0000 mg | ORAL_TABLET | Freq: Every day | ORAL | Status: DC

## 2016-03-04 MED ORDER — OXYCODONE-ACETAMINOPHEN 5-325 MG PO TABS: 1.0000 | ORAL_TABLET | ORAL | Status: DC | PRN

## 2016-03-04 NOTE — Discharge Instructions (Signed)
Cesarean Delivery, Care After Refer to this sheet in the next few weeks. These instructions provide you with information on caring for yourself after your procedure. Your health care provider may also give you specific instructions. Your treatment has been planned according to current medical practices, but problems sometimes occur. Call your health care provider if you have any problems or questions after you go home. HOME CARE INSTRUCTIONS  Only take over-the-counter or prescription medications as directed by your health care provider.  Do not drink alcohol, especially if you are breastfeeding or taking medication to relieve pain.  Do not chew or smoke tobacco.  Continue to use good perineal care. Good perineal care includes:  Wiping your perineum from front to back.  Keeping your perineum clean.  Check your surgical cut (incision) daily for increased redness, drainage, swelling, or separation of skin.  Clean your incision gently with soap and water every day, and then pat it dry. If your health care provider says it is okay, leave the incision uncovered. Use a bandage (dressing) if the incision is draining fluid or appears irritated. If the adhesive strips across the incision do not fall off within 7 days, carefully peel them off.  Hug a pillow when coughing or sneezing until your incision is healed. This helps to relieve pain.  Do not use tampons or douche for the next 6 weeks.  Showers only, no tub baths for the next 6 weeks.   Wear a well-fitting bra that provides breast support.  Limit wearing support panties or control-top hose.  Drink enough fluids to keep your urine clear or pale yellow.  Eat high-fiber foods such as whole grain cereals and breads, brown rice, beans, and fresh fruits and vegetables every day. These foods may help prevent or relieve constipation.  Resume activities such as climbing stairs, driving, lifting, exercising, or traveling as directed by your  health care provider.  No sexual intercourse for at least the next 6 weeks and then not until you feel ready.  Try to have someone help you with your household activities and your newborn for at least a few days after you leave the hospital.  Rest as much as possible. Try to rest or take a nap when your newborn is sleeping.  Increase your activities gradually.  Keep all of your scheduled postpartum appointments. It is very important to keep your scheduled follow-up appointments. At these appointments, your health care provider will be checking to make sure that you are healing physically and emotionally. SEEK MEDICAL CARE IF:   You are passing large clots from your vagina. Save any clots to show your health care provider.  You have a foul smelling discharge from your vagina.  You have trouble urinating.  You are urinating frequently.  You have pain when you urinate.  You have a change in your bowel movements.  You have increasing redness, pain, or swelling near your incision.  You have pus draining from your incision.  Your incision is separating.  You have painful, hard, or reddened breasts.  You have a severe headache.  You have blurred vision or see spots.  You feel sad or depressed.  You have thoughts of hurting yourself or your newborn.  You have questions about your care, the care of your newborn, or medications.  You are dizzy or light-headed.  You have a rash.  You have pain, redness, or swelling at the site of the removed intravenous access (IV) tube.  You have nausea or vomiting.  You stopped breastfeeding and have not had a menstrual period within 12 weeks of stopping.  You are not breastfeeding and have not had a menstrual period within 12 weeks of delivery.  You have a fever. SEEK IMMEDIATE MEDICAL CARE IF:  You have persistent pain.  You have chest pain.  You have shortness of breath.  You faint.  You have leg pain.  You have stomach  pain.  Your vaginal bleeding saturates 2 or more sanitary pads in 1 hour. MAKE SURE YOU:   Understand these instructions.  Will watch your condition.  Will get help right away if you are not doing well or get worse.   This information is not intended to replace advice given to you by your health care provider. Make sure you discuss any questions you have with your health care provider.   Document Released: 06/22/2002 Document Revised: 10/21/2014 Document Reviewed: 05/27/2012 Elsevier Interactive Patient Education [employer]2016 [employer]

## 2016-03-04 NOTE — Progress Notes (Signed)
Patient discharged home with infant. Vital signs stable, bleeding within normal limits, uterus firm. Discharge instructions, prescriptions, and follow up appointment given to and reviewed with patient. Patient verbalized understanding, all questions answered. Escorted in wheelchair by nursing.    

## 2017-04-11 ENCOUNTER — Encounter: Payer: Self-pay | Admitting: Obstetrics and Gynecology

## 2017-04-11 ENCOUNTER — Ambulatory Visit (INDEPENDENT_AMBULATORY_CARE_PROVIDER_SITE_OTHER): Payer: Medicaid Other | Admitting: Obstetrics and Gynecology

## 2017-04-11 VITALS — BP 118/74 | Ht 61.0 in | Wt 134.0 lb

## 2017-04-11 DIAGNOSIS — Z308 Encounter for other contraceptive management: Secondary | ICD-10-CM | POA: Diagnosis not present

## 2017-04-11 DIAGNOSIS — Z0000 Encounter for general adult medical examination without abnormal findings: Secondary | ICD-10-CM

## 2017-04-11 DIAGNOSIS — N921 Excessive and frequent menstruation with irregular cycle: Secondary | ICD-10-CM | POA: Diagnosis not present

## 2017-04-11 DIAGNOSIS — Z01419 Encounter for gynecological examination (general) (routine) without abnormal findings: Secondary | ICD-10-CM

## 2017-04-11 DIAGNOSIS — Z113 Encounter for screening for infections with a predominantly sexual mode of transmission: Secondary | ICD-10-CM

## 2017-04-11 DIAGNOSIS — N920 Excessive and frequent menstruation with regular cycle: Secondary | ICD-10-CM | POA: Insufficient documentation

## 2017-04-11 MED ORDER — ESTRADIOL 2 MG PO TABS: 2.0000 mg | ORAL_TABLET | Freq: Every day | ORAL | 5 refills | Status: DC

## 2017-04-11 NOTE — Progress Notes (Addendum)
Gynecology Annual Exam  PCP: Patient, No Pcp Per  Chief Complaint  Patient presents with  . Annual Exam    History of Present Illness:  Ms. Tilden FossaCecilia Tygart is a 21 y.o. G1P1001 who LMP was No LMP recorded., presents today for her annual examination.  Come frequently and last for varying amounts of time.  She is using Nexplanon for contraception.  She would like to fix her periods so they are more regular.   She is single partner, contraception - nexplanon.  Last Pap: n/a due to age Hx of STDs: none  There is no FH of breast cancer. There is no FH of ovarian cancer. The patient does not do self-breast exams.  Tobacco use: The patient denies current or previous tobacco use. Alcohol use: social drinker Exercise: moderately active  The patient wears seatbelts: yes.   The patient reports that domestic violence in her life is absent.   Past Medical History:  Diagnosis Date  . Anemia   . Asthma     Past Surgical History:  Procedure Laterality Date  . CESAREAN SECTION N/A 03/01/2016   Procedure: CESAREAN SECTION;  Surgeon: Conard NovakStephen D Jinnie Onley, MD;  Location: [facility] ORS;  Service: Obstetrics;  Laterality: N/A;    Prior to Admission medications   Medication Sig Start Date End Date Taking? Authorizing Provider  albuterol (PROAIR HFA) 108 (90 BASE) MCG/ACT inhaler Inhale 1 puff into the lungs 3 (three) times daily as needed for wheezing (for cough).   Yes [provider]  etonogestrel (NEXPLANON) 68 MG IMPL implant 1 each by Subdermal route once.   Yes [provider]   Allergies: No Known Allergies  Gynecologic History: No LMP recorded. using Nexplanon   Obstetric History: G1P1001  Social History   Social History  . Marital status: Single    Spouse name: N/A  . Number of children: N/A  . Years of education: N/A   Occupational History  . Not on file.   Social History Main Topics  . Smoking status: Never Smoker  . Smokeless tobacco: Never Used  . Alcohol  use No  . Drug use: No  . Sexual activity: Yes   Other Topics Concern  . Not on file   Social History Narrative  . No narrative on file    Family History  Problem Relation Age of Onset  . Breast cancer Maternal Aunt   . Cancer Maternal Grandfather     Review of Systems  Constitutional: Negative.   HENT: Negative.   Eyes: Negative.   Respiratory: Negative.   Cardiovascular: Negative.   Gastrointestinal: Negative.   Genitourinary: Negative.   Musculoskeletal: Negative.   Skin: Negative.   Neurological: Negative.   Psychiatric/Behavioral: Negative.      Physical Exam BP 118/74   Ht 5\' 1"  (1.549 m)   Wt 134 lb (60.8 kg)   BMI 25.32 kg/m    Physical Exam  Constitutional: She is oriented to person, place, and time. She appears well-developed and well-nourished. No distress.  Genitourinary: Vagina normal and uterus normal. Pelvic exam was performed with patient supine. There is no rash, tenderness, lesion or injury on the right labia. There is no rash, tenderness, lesion or injury on the left labia. No tenderness or bleeding in the vagina. No signs of injury around the vagina. Right adnexum does not display mass, does not display tenderness and does not display fullness. Left adnexum does not display mass, does not display tenderness and does not display fullness. Cervix does not exhibit  motion tenderness, lesion, discharge or polyp.   Uterus is mobile and anteverted. Uterus is not enlarged, tender, exhibiting a mass or irregular (is regular).  HENT:  Head: Normocephalic and atraumatic.  Eyes: EOM are normal. No scleral icterus.  Neck: Normal range of motion. Neck supple. No thyromegaly present.  Cardiovascular: Normal rate and regular rhythm.  Exam reveals no gallop and no friction rub.   No murmur heard. Pulmonary/Chest: Effort normal and breath sounds normal. No respiratory distress. She has no wheezes. She has no rales. Right breast exhibits no inverted nipple, no mass,  no nipple discharge, no skin change and no tenderness. Left breast exhibits no inverted nipple, no mass, no nipple discharge, no skin change and no tenderness.  Abdominal: Soft. Bowel sounds are normal. She exhibits no distension and no mass. There is no tenderness. There is no rebound and no guarding.  Musculoskeletal: Normal range of motion. She exhibits no edema.  Lymphadenopathy:    She has no cervical adenopathy.  Neurological: She is alert and oriented to person, place, and time. No cranial nerve deficit.  Skin: Skin is warm and dry. No erythema.  Psychiatric: She has a normal mood and affect. Her behavior is normal. Judgment normal.   Female chaperone present for pelvic and breast  portions of the physical exam  Results: AUDIT Questionnaire (screen for alcoholism): 1 PHQ-9: 5   Assessment: 21 y.o. G86P1001 female here for routine annual gynecologic examination  Plan: Problem List Items Addressed This Visit    Menorrhagia   Relevant Medications   estradiol (ESTRACE) 2 MG tablet   Other Relevant Orders   GC/Chlamydia Probe Amp    Other Visit Diagnoses    Women's annual routine gynecological examination    -  Primary   Relevant Orders   GC/Chlamydia Probe Amp   Screen for STD (sexually transmitted disease)       Relevant Orders   GC/Chlamydia Probe Amp     Screening: -- Blood pressure screen normal -- Weight screening: normal -- Depression screening negative (PHQ-9) -- Nutrition: normal -- cholesterol screening: not due for screening -- osteoporosis screening: not due -- tobacco screening: not using -- alcohol screening: AUDIT questionnaire indicates low-risk usage. -- family history of breast cancer screening: done. not at high risk. -- no evidence of domestic violence or intimate partner violence. -- STD screening: gonorrhea/chlamydia NAAT collected -- pap smear not collected per ASCCP guidelines  Thomasene Mohair, MD 04/11/2017 1:26 PM

## 2017-04-11 NOTE — Addendum Note (Signed)
Addended by: Thomasene MohairJACKSON, Nayely Dingus D on: 04/11/2017 01:27 PM   Modules accepted: Orders

## 2017-04-13 LAB — GC/CHLAMYDIA PROBE AMP
Chlamydia trachomatis, NAA: NEGATIVE
NEISSERIA GONORRHOEAE BY PCR: NEGATIVE

## 2017-04-21 ENCOUNTER — Encounter: Payer: Self-pay | Admitting: Obstetrics and Gynecology

## 2017-06-05 ENCOUNTER — Telehealth: Payer: Self-pay

## 2017-06-05 NOTE — Telephone Encounter (Signed)
PA form given to [facility] for his return. She has not been seen since 03/2017. I am not sure if he wants her to follow up with him before refilling medication. I will inform pt.

## 2017-06-05 NOTE — Telephone Encounter (Signed)
Pt needs prior auth in order to get refill on rx c Medicaid.  [phone]  Estradiol - [facility] [place]

## 2019-06-23 ENCOUNTER — Other Ambulatory Visit: Payer: Self-pay | Admitting: Family Medicine

## 2019-06-23 DIAGNOSIS — Z3481 Encounter for supervision of other normal pregnancy, first trimester: Secondary | ICD-10-CM

## 2019-07-02 ENCOUNTER — Ambulatory Visit (INDEPENDENT_AMBULATORY_CARE_PROVIDER_SITE_OTHER): Payer: Medicaid Other | Admitting: Maternal Newborn

## 2019-07-02 ENCOUNTER — Other Ambulatory Visit: Payer: Self-pay | Admitting: Maternal Newborn

## 2019-07-02 ENCOUNTER — Encounter: Payer: Self-pay | Admitting: Maternal Newborn

## 2019-07-02 ENCOUNTER — Other Ambulatory Visit: Payer: Self-pay

## 2019-07-02 ENCOUNTER — Other Ambulatory Visit (HOSPITAL_COMMUNITY)
Admission: RE | Admit: 2019-07-02 | Discharge: 2019-07-02 | Disposition: A | Discharge status: Home / Self Care | Payer: Medicaid Other | Source: Ambulatory Visit | Attending: Maternal Newborn | Admitting: Maternal Newborn

## 2019-07-02 VITALS — BP 120/80 | Wt 133.0 lb

## 2019-07-02 DIAGNOSIS — Z3A01 Less than 8 weeks gestation of pregnancy: Secondary | ICD-10-CM

## 2019-07-02 DIAGNOSIS — O219 Vomiting of pregnancy, unspecified: Secondary | ICD-10-CM | POA: Diagnosis not present

## 2019-07-02 DIAGNOSIS — Z369 Encounter for antenatal screening, unspecified: Secondary | ICD-10-CM

## 2019-07-02 DIAGNOSIS — Z3480 Encounter for supervision of other normal pregnancy, unspecified trimester: Secondary | ICD-10-CM | POA: Insufficient documentation

## 2019-07-02 DIAGNOSIS — O0990 Supervision of high risk pregnancy, unspecified, unspecified trimester: Secondary | ICD-10-CM | POA: Insufficient documentation

## 2019-07-02 DIAGNOSIS — O34219 Maternal care for unspecified type scar from previous cesarean delivery: Secondary | ICD-10-CM | POA: Diagnosis not present

## 2019-07-02 LAB — POCT URINALYSIS DIPSTICK OB
Glucose, UA: NEGATIVE
POC,PROTEIN,UA: NEGATIVE

## 2019-07-02 MED ORDER — DOXYLAMINE-PYRIDOXINE 10-10 MG PO TBEC: 2.0000 | DELAYED_RELEASE_TABLET | Freq: Every day | ORAL | 5 refills | Status: DC

## 2019-07-02 NOTE — Progress Notes (Signed)
07/02/2019   Chief Complaint: Desires prenatal care.  Transfer of Care Patient: Yes  History of Present Illness: Ms. Nicole Hartman is a 23 y.o. G2P1001 at 10223w4d based on Patient's last menstrual period on 05/17/2019, with an Estimated Date of Delivery: 02/21/2020, with the above CC.   Her periods were: irregular periods; had Nexplanon removed in June. Her LMP that began 8/3 lasted 7 days She has Positive signs or symptoms of nausea/vomiting of pregnancy; usually only nausea, 2 episodes of emesis She has Negative signs or symptoms of miscarriage or preterm labor; had some pink spotting when she wiped for a week but this has resolved She identifies Negative Zika risk factors for her and her partner On any different medications around the time she conceived/early pregnancy: Yes, occasional inhaler use with asthma History of varicella: No   Review of Systems  Constitutional:       Change in appetite  HENT: Negative.   Eyes: Negative.   Respiratory: Negative for cough, shortness of breath and wheezing.   Cardiovascular: Negative for chest pain and palpitations.  Gastrointestinal: Positive for constipation, diarrhea, nausea and vomiting.  Genitourinary: Negative.   Musculoskeletal: Negative.   Skin: Negative.   Neurological: Positive for headaches.       Migraines  Endo/Heme/Allergies: Negative.   Psychiatric/Behavioral: Negative.   Breasts: Positive for breast tenderness. Negative for new or changing breast lumps or nipple discharge.  Review of systems was otherwise negative, except as stated in the above HPI.  OBGYN History: As per HPI. OB History  Gravida Para Term Preterm AB Living  2 1 1     1   SAB TAB Ectopic Multiple Live Births        0 1    # Outcome Date GA Lbr Len/2nd Weight Sex Delivery Anes PTL Lv  2 Current           1 Term 03/01/16 4674w5d / 04:35 6 lb 14.8 oz (3.14 kg) M CS-LVertical Spinal      Any issues with any prior pregnancies: yes, Cesarean delivery with G1 due  to second stage arrest. Any prior children are healthy, doing well, without any problems or issues: yes History of pap smears: Yes. Last pap smear 04/2018. NILM per patient, to provide record History of STIs: No   Past Medical History: Past Medical History:  Diagnosis Date  . Anemia   . Asthma   . Cesarean delivery delivered 03/02/2016    Past Surgical History: Past Surgical History:  Procedure Laterality Date  . CESAREAN SECTION N/A 03/01/2016   Procedure: CESAREAN SECTION;  Surgeon: Conard NovakStephen D Jackson, MD;  Location: [facility] ORS;  Service: Obstetrics;  Laterality: N/A;    Family History:  Family History  Problem Relation Age of Onset  . Breast cancer Maternal Aunt   . Cancer Maternal Grandfather     She does not have any history of female cancers, bleeding or blood clotting disorders.  There is a history of a cousin in her family on the maternal side with a chromosomal disorder; she is not sure which one. There is no other history of intellectual disability, birth defects or genetic disorders in her or the FOB's history.  Social History:  Social History   Socioeconomic History  . Marital status: Single    Spouse name: Not on file  . Number of children: Not on file  . Years of education: Not on file  . Highest education level: Not on file  Occupational History  . Not on file  Social Needs  . Financial resource strain: Not on file  . Food insecurity    Worry: Not on file    Inability: Not on file  . Transportation needs    Medical: Not on file    Non-medical: Not on file  Tobacco Use  . Smoking status: Never Smoker  . Smokeless tobacco: Never Used  Substance and Sexual Activity  . Alcohol use: No  . Drug use: No  . Sexual activity: Yes  Lifestyle  . Physical activity    Days per week: Not on file    Minutes per session: Not on file  . Stress: Not on file  Relationships  . Social Herbalist on phone: Not on file    Gets together: Not on file     Attends religious service: Not on file    Active member of club or organization: Not on file    Attends meetings of clubs or organizations: Not on file    Relationship status: Not on file  . Intimate partner violence    Fear of current or ex partner: Not on file    Emotionally abused: Not on file    Physically abused: Not on file    Forced sexual activity: Not on file  Other Topics Concern  . Not on file  Social History Narrative  . Not on file   Any cats in the household: no Domestic violence screening is negative.  Allergy: No Known Allergies  Current Outpatient Medications:  Current Outpatient Medications:  .  albuterol (PROAIR HFA) 108 (90 BASE) MCG/ACT inhaler, Inhale 1 puff into the lungs 3 (three) times daily as needed for wheezing (for cough)., Disp: , Rfl:  .  albuterol (PROVENTIL HFA;VENTOLIN HFA) 108 (90 BASE) MCG/ACT inhaler, Inhale 2 puffs into the lungs every 6 (six) hours as needed for wheezing or shortness of breath. Patient may resume home supply., Disp: , Rfl:  .  fluticasone (FLONASE) 50 MCG/ACT nasal spray, Place 1 spray into the nose daily. Patient may resume home supply, Disp: , Rfl:    Physical Exam:   BP 120/80   Wt 133 lb (60.3 kg)   LMP 05/17/2019   BMI 25.13 kg/m  Body mass index is 25.13 kg/m. Constitutional: Well nourished, well developed female in no acute distress.  Neck:  Supple, normal appearance, and no thyromegaly  Cardiovascular: S1, S2 normal, no murmur, rub or gallop, regular rate and rhythm Respiratory:  Clear to auscultation bilaterally. Normal respiratory effort Abdomen: no masses, hernias; diffusely non tender to palpation, non distended Breasts: breasts appear normal, no suspicious masses, no skin or nipple changes or axillary nodes. Neuro/Psych:  Normal mood and affect.  Skin:  Warm and dry.  Lymphatic:  No inguinal lymphadenopathy.   Pelvic exam: is not limited by body habitus External genitalia, Bartholin's glands, Urethra,  Skene's glands: within normal limits Vagina: within normal limits and with no blood in the vault  Cervix: speculum exam declined after shared decision making Uterus:  Enlarged consistent with early pregnancy, normal contour Adnexa:  no mass, fullness, tenderness  Assessment: Ms. Seelinger is a 23 y.o. G2P1001 at [redacted]w[redacted]d based on Patient's last menstrual period on 05/17/2019, with an Estimated Date of Delivery: 02/21/2020, presenting for prenatal care.  Plan:  1) Avoid alcoholic beverages. 2) Patient encouraged not to smoke.  3) Discontinue the use of all non-medicinal drugs and chemicals.  4) Take prenatal vitamins daily.  5) Seatbelt use advised 6) Nutrition, food safety, and exercise discussed.  7) Hospital and practice style; delivering at [facility] discussed  8) Patient is asked about travel to areas at risk for the Zika virus, and counseled to avoid travel and exposure to mosquitoes or partners who may have themselves been exposed to the virus.  9) Childbirth classes at [facility] advised 10) Genetic Screening, such as with 1st Trimester Screening, cell free fetal DNA, AFP testing, and Ultrasound is discussed with patient. She plans to have genetic testing this pregnancy. 11) NOB labs today. 12) Dating scan ordered. 13) Rx for Diclegis to help with nausea.  Problem list reviewed and updated.  Marcelyn Bruins, CNM [facility], [facility] 07/02/2019  1:34 PM

## 2019-07-03 LAB — URINE DRUG PANEL 7
Amphetamines, Urine: NEGATIVE ng/mL
Barbiturate Quant, Ur: NEGATIVE ng/mL
Benzodiazepine Quant, Ur: NEGATIVE ng/mL
Cannabinoid Quant, Ur: NEGATIVE ng/mL
Cocaine (Metab.): NEGATIVE ng/mL
Opiate Quant, Ur: NEGATIVE ng/mL
PCP Quant, Ur: NEGATIVE ng/mL

## 2019-07-04 LAB — URINE CULTURE

## 2019-07-05 ENCOUNTER — Other Ambulatory Visit: Payer: Self-pay

## 2019-07-05 ENCOUNTER — Other Ambulatory Visit: Payer: Self-pay | Admitting: Maternal Newborn

## 2019-07-05 ENCOUNTER — Ambulatory Visit
Admission: RE | Admit: 2019-07-05 | Discharge: 2019-07-05 | Disposition: A | Discharge status: Home / Self Care | Payer: Medicaid Other | Source: Ambulatory Visit | Attending: Family Medicine | Admitting: Family Medicine

## 2019-07-05 DIAGNOSIS — Z3481 Encounter for supervision of other normal pregnancy, first trimester: Secondary | ICD-10-CM | POA: Diagnosis not present

## 2019-07-05 DIAGNOSIS — O2340 Unspecified infection of urinary tract in pregnancy, unspecified trimester: Secondary | ICD-10-CM

## 2019-07-05 LAB — HEMOGLOBINOPATHY EVALUATION
HGB C: 0 %
HGB S: 0 %
HGB VARIANT: 0 %
Hemoglobin A2 Quantitation: 2.7 % (ref 1.8–3.2)
Hemoglobin F Quantitation: 0 % (ref 0.0–2.0)
Hgb A: 97.3 % (ref 96.4–98.8)

## 2019-07-05 LAB — RPR+RH+ABO+RUB AB+AB SCR+CB...
Antibody Screen: NEGATIVE
HIV Screen 4th Generation wRfx: NONREACTIVE
Hematocrit: 34.8 % (ref 34.0–46.6)
Hemoglobin: 11.6 g/dL (ref 11.1–15.9)
Hepatitis B Surface Ag: NEGATIVE
MCH: 28.8 pg (ref 26.6–33.0)
MCHC: 33.3 g/dL (ref 31.5–35.7)
MCV: 86 fL (ref 79–97)
Platelets: 188 10*3/uL (ref 150–450)
RBC: 4.03 x10E6/uL (ref 3.77–5.28)
RDW: 12.6 % (ref 11.7–15.4)
RPR Ser Ql: NONREACTIVE
Rh Factor: POSITIVE
Rubella Antibodies, IGG: <0.9 index — ABNORMAL LOW (ref >0.99)
Varicella zoster IgG: 854 index (ref >165)
WBC: 5.7 10*3/uL (ref 3.4–10.8)

## 2019-07-05 MED ORDER — CEPHALEXIN 500 MG PO CAPS: 500.0000 mg | ORAL_CAPSULE | Freq: Four times a day (QID) | ORAL | 0 refills | Status: AC

## 2019-07-05 NOTE — Progress Notes (Signed)
Rx for Keflex to treat UTI, patient aware.

## 2019-07-06 LAB — CERVICOVAGINAL ANCILLARY ONLY
Chlamydia: NEGATIVE
Neisseria Gonorrhea: NEGATIVE

## 2019-07-08 ENCOUNTER — Encounter: Payer: Self-pay | Admitting: Maternal Newborn

## 2019-07-08 ENCOUNTER — Ambulatory Visit (INDEPENDENT_AMBULATORY_CARE_PROVIDER_SITE_OTHER): Payer: Medicaid Other | Admitting: Maternal Newborn

## 2019-07-08 ENCOUNTER — Ambulatory Visit (INDEPENDENT_AMBULATORY_CARE_PROVIDER_SITE_OTHER): Payer: Medicaid Other

## 2019-07-08 ENCOUNTER — Other Ambulatory Visit: Payer: Self-pay

## 2019-07-08 VITALS — BP 100/60 | Wt 131.0 lb

## 2019-07-08 DIAGNOSIS — Z23 Encounter for immunization: Secondary | ICD-10-CM

## 2019-07-08 DIAGNOSIS — O3481 Maternal care for other abnormalities of pelvic organs, first trimester: Secondary | ICD-10-CM | POA: Diagnosis not present

## 2019-07-08 DIAGNOSIS — O34219 Maternal care for unspecified type scar from previous cesarean delivery: Secondary | ICD-10-CM

## 2019-07-08 DIAGNOSIS — N8311 Corpus luteum cyst of right ovary: Secondary | ICD-10-CM

## 2019-07-08 DIAGNOSIS — Z3A01 Less than 8 weeks gestation of pregnancy: Secondary | ICD-10-CM

## 2019-07-08 DIAGNOSIS — Z3A08 8 weeks gestation of pregnancy: Secondary | ICD-10-CM

## 2019-07-08 DIAGNOSIS — Z3480 Encounter for supervision of other normal pregnancy, unspecified trimester: Secondary | ICD-10-CM

## 2019-07-08 DIAGNOSIS — Z369 Encounter for antenatal screening, unspecified: Secondary | ICD-10-CM

## 2019-07-08 NOTE — Progress Notes (Signed)
    Routine Prenatal Care Visit  Subjective  Nicole Hartman is a 23 y.o. G2P1001 at [redacted]w[redacted]d being seen today for ongoing prenatal care.  She is currently monitored for the following issues for this low-risk pregnancy and has PTSD (post-traumatic stress disorder); MDD (major depressive disorder), recurrent episode, severe ([place]); Menorrhagia; Supervision of other normal pregnancy, antepartum; and History of cesarean delivery, currently pregnant on their problem list.  ----------------------------------------------------------------------------------- Patient reports fatigue. Diclegis made her stomach ache when she took it right after a meal.  Vag. Bleeding: None.  ----------------------------------------------------------------------------------- The following portions of the patient's history were reviewed and updated as appropriate: allergies, current medications, past family history, past medical history, past social history, past surgical history and problem list. Problem list updated.   Objective  Blood pressure 100/60, weight 131 lb (59.4 kg), last menstrual period 05/17/2019, unknown if currently breastfeeding. Pregravid weight 133 lb (60.3 kg) Total Weight Gain -2 lb (-0.907 kg)  Fetal Status: Fetal Heart Rate (bpm): 171 ([place])         General:  Alert, oriented and cooperative. Patient is in no acute distress.  Skin: Skin is warm and dry. No rash noted.   Cardiovascular: Normal heart rate noted  Respiratory: Normal respiratory effort, no problems with respiration noted  Abdomen: Soft, gravid, appropriate for gestational age.       Pelvic:  Cervical exam deferred        Extremities: Normal range of motion.     Mental Status: Normal mood and affect. Normal behavior. Normal judgment and thought content.     Assessment   23 y.o. G2P1001 at [redacted]w[redacted]d, EDD 02/21/2020 by Last Menstrual Period presenting for a routine prenatal visit.  Plan   pregnancy2 Problems (from 05/17/19 to present)    Problem Noted Resolved   Supervision of other normal pregnancy, antepartum 07/02/2019 by Rexene Agent, CNM No   Overview Addendum 07/05/2019  9:21 AM by Rexene Agent, [place] Prenatal Labs  Dating  Blood type:     Genetic Screen 1 Screen:    AFP:     Quad:     NIPS: Antibody:   Anatomic [place]  Rubella:   Varicella:    GTT Early:               Third trimester:  RPR:     Rhogam  HBsAg:     TDaP vaccine                       Flu Shot: HIV:     Baby Food                                GBS:   Contraception  Pap:  CBB     CS/VBAC    Support Person                 Dating scan today shows singleton IUP at [redacted]w[redacted]d, EDD 02/16/2020. Will use ultrasound dating as LMP is approximate. FHR 171 bpm, simple right ovarian cyst present measuring 3.2 cm. Results reviewed with patient.  Advised trying Diclegis at bedtime, may consider other medication if problem persists.  Would like to have genetic screening at 10 weeks.  Please refer to After Visit Summary for other counseling recommendations.   Return in about 2 weeks (around 07/22/2019) for [place] and [place].  Avel Sensor, CNM 07/08/2019  4:16 PM

## 2019-07-08 NOTE — Patient Instructions (Signed)
First Trimester of Pregnancy The first trimester of pregnancy is from week 1 until the end of week 13 (months 1 through 3). A week after a sperm fertilizes an egg, the egg will implant on the wall of the uterus. This embryo will begin to develop into a baby. Genes from you and your partner will form the baby. The female genes will determine whether the baby will be a boy or a girl. At 6-8 weeks, the eyes and face will be formed, and the heartbeat can be seen on ultrasound. At the end of 12 weeks, all the baby's organs will be formed. Now that you are pregnant, you will want to do everything you can to have a healthy baby. Two of the most important things are to get good prenatal care and to follow your health care provider's instructions. Prenatal care is all the medical care you receive before the baby's birth. This care will help prevent, find, and treat any problems during the pregnancy and childbirth. Body changes during your first trimester Your body goes through many changes during pregnancy. The changes vary from woman to woman.  You may gain or lose a couple of pounds at first.  You may feel sick to your stomach (nauseous) and you may throw up (vomit). If the vomiting is uncontrollable, call your health care provider.  You may tire easily.  You may develop headaches that can be relieved by medicines. All medicines should be approved by your health care provider.  You may urinate more often. Painful urination may mean you have a bladder infection.  You may develop heartburn as a result of your pregnancy.  You may develop constipation because certain hormones are causing the muscles that push stool through your intestines to slow down.  You may develop hemorrhoids or swollen veins (varicose veins).  Your breasts may begin to grow larger and become tender. Your nipples may stick out more, and the tissue that surrounds them (areola) may become darker.  Your gums may bleed and may be  sensitive to brushing and flossing.  Dark spots or blotches (chloasma, mask of pregnancy) may develop on your face. This will likely fade after the baby is born.  Your menstrual periods will stop.  You may have a loss of appetite.  You may develop cravings for certain kinds of food.  You may have changes in your emotions from day to day, such as being excited to be pregnant or being concerned that something may go wrong with the pregnancy and baby.  You may have more vivid and strange dreams.  You may have changes in your hair. These can include thickening of your hair, rapid growth, and changes in texture. Some women also have hair loss during or after pregnancy, or hair that feels dry or thin. Your hair will most likely return to normal after your baby is born. What to expect at prenatal visits During a routine prenatal visit:  You will be weighed to make sure you and the baby are growing normally.  Your blood pressure will be taken.  Your abdomen will be measured to track your baby's growth.  The fetal heartbeat will be listened to between weeks 10 and 14 of your pregnancy.  Test results from any previous visits will be discussed. Your health care provider may ask you:  How you are feeling.  If you are feeling the baby move.  If you have had any abnormal symptoms, such as leaking fluid, bleeding, severe headaches, or abdominal   cramping.  If you are using any tobacco products, including cigarettes, chewing tobacco, and electronic cigarettes.  If you have any questions. Other tests that may be performed during your first trimester include:  Blood tests to find your blood type and to check for the presence of any previous infections. The tests will also be used to check for low iron levels (anemia) and protein on red blood cells (Rh antibodies). Depending on your risk factors, or if you previously had diabetes during pregnancy, you may have tests to check for high blood sugar  that affects pregnant women (gestational diabetes).  Urine tests to check for infections, diabetes, or protein in the urine.  An ultrasound to confirm the proper growth and development of the baby.  Fetal screens for spinal cord problems (spina bifida) and Down syndrome.  HIV (human immunodeficiency virus) testing. Routine prenatal testing includes screening for HIV, unless you choose not to have this test.  You may need other tests to make sure you and the baby are doing well. Follow these instructions at home: Medicines  Follow your health care provider's instructions regarding medicine use. Specific medicines may be either safe or unsafe to take during pregnancy.  Take a prenatal vitamin that contains at least 600 micrograms (mcg) of folic acid.  If you develop constipation, try taking a stool softener if your health care provider approves. Eating and drinking   Eat a balanced diet that includes fresh fruits and vegetables, whole grains, good sources of protein such as meat, eggs, or tofu, and low-fat dairy. Your health care provider will help you determine the amount of weight gain that is right for you.  Avoid raw meat and uncooked cheese. These carry germs that can cause birth defects in the baby.  Eating four or five small meals rather than three large meals a day may help relieve nausea and vomiting. If you start to feel nauseous, eating a few soda crackers can be helpful. Drinking liquids between meals, instead of during meals, also seems to help ease nausea and vomiting.  Limit foods that are high in fat and processed sugars, such as fried and sweet foods.  To prevent constipation: ? Eat foods that are high in fiber, such as fresh fruits and vegetables, whole grains, and beans. ? Drink enough fluid to keep your urine clear or pale yellow. Activity  Exercise only as directed by your health care provider. Most women can continue their usual exercise routine during  pregnancy. Try to exercise for 30 minutes at least 5 days a week. Exercising will help you: ? Control your weight. ? Stay in shape. ? Be prepared for labor and delivery.  Experiencing pain or cramping in the lower abdomen or lower back is a good sign that you should stop exercising. Check with your health care provider before continuing with normal exercises.  Try to avoid standing for long periods of time. Move your legs often if you must stand in one place for a long time.  Avoid heavy lifting.  Wear low-heeled shoes and practice good posture.  You may continue to have sex unless your health care provider tells you not to. Relieving pain and discomfort  Wear a good support bra to relieve breast tenderness.  Take warm sitz baths to soothe any pain or discomfort caused by hemorrhoids. Use hemorrhoid cream if your health care provider approves.  Rest with your legs elevated if you have leg cramps or low back pain.  If you develop varicose veins in   your legs, wear support hose. Elevate your feet for 15 minutes, 3-4 times a day. Limit salt in your diet. Prenatal care  Schedule your prenatal visits by the twelfth week of pregnancy. They are usually scheduled monthly at first, then more often in the last 2 months before delivery.  Write down your questions. Take them to your prenatal visits.  Keep all your prenatal visits as told by your health care provider. This is important. Safety  Wear your seat belt at all times when driving.  Make a list of emergency phone numbers, including numbers for family, friends, the hospital, and police and fire departments. General instructions  Ask your health care provider for a referral to a local prenatal education class. Begin classes no later than the beginning of month 6 of your pregnancy.  Ask for help if you have counseling or nutritional needs during pregnancy. Your health care provider can offer advice or refer you to specialists for help  with various needs.  Do not use hot tubs, steam rooms, or saunas.  Do not douche or use tampons or scented sanitary pads.  Do not cross your legs for long periods of time.  Avoid cat litter boxes and soil used by cats. These carry germs that can cause birth defects in the baby and possibly loss of the fetus by miscarriage or stillbirth.  Avoid all smoking, herbs, alcohol, and medicines not prescribed by your health care provider. Chemicals in these products affect the formation and growth of the baby.  Do not use any products that contain nicotine or tobacco, such as cigarettes and e-cigarettes. If you need help quitting, ask your health care provider. You may receive counseling support and other resources to help you quit.  Schedule a dentist appointment. At home, brush your teeth with a soft toothbrush and be gentle when you floss. Contact a health care provider if:  You have dizziness.  You have mild pelvic cramps, pelvic pressure, or nagging pain in the abdominal area.  You have persistent nausea, vomiting, or diarrhea.  You have a bad smelling vaginal discharge.  You have pain when you urinate.  You notice increased swelling in your face, hands, legs, or ankles.  You are exposed to fifth disease or chickenpox.  You are exposed to German measles (rubella) and have never had it. Get help right away if:  You have a fever.  You are leaking fluid from your vagina.  You have spotting or bleeding from your vagina.  You have severe abdominal cramping or pain.  You have rapid weight gain or loss.  You vomit blood or material that looks like coffee grounds.  You develop a severe headache.  You have shortness of breath.  You have any kind of trauma, such as from a fall or a car accident. Summary  The first trimester of pregnancy is from week 1 until the end of week 13 (months 1 through 3).  Your body goes through many changes during pregnancy. The changes vary from  woman to woman.  You will have routine prenatal visits. During those visits, your health care provider will examine you, discuss any test results you may have, and talk with you about how you are feeling. This information is not intended to replace advice given to you by your health care provider. Make sure you discuss any questions you have with your health care provider. Document Released: 09/24/2001 Document Revised: 09/12/2017 Document Reviewed: 09/11/2016 Elsevier Patient Education  2020 [employer]  

## 2019-07-08 NOTE — Progress Notes (Signed)
ROB/Dating scan- no concerns/flu shot today

## 2019-07-20 ENCOUNTER — Telehealth: Payer: Self-pay

## 2019-07-20 NOTE — Telephone Encounter (Signed)
Pt calling; has nausea; diclegis made her feel worse; the past few days had very bitter throw up.  What to do?  [phone]

## 2019-07-21 ENCOUNTER — Other Ambulatory Visit: Payer: Self-pay | Admitting: Maternal Newborn

## 2019-07-21 DIAGNOSIS — O219 Vomiting of pregnancy, unspecified: Secondary | ICD-10-CM

## 2019-07-21 MED ORDER — PROMETHAZINE HCL 25 MG PO TABS: 25.0000 mg | ORAL_TABLET | Freq: Four times a day (QID) | ORAL | 2 refills | Status: DC | PRN

## 2019-07-21 NOTE — Telephone Encounter (Signed)
Sent new Rx for Phenergan, please let her know. Thanks!

## 2019-07-21 NOTE — Telephone Encounter (Signed)
Left detailed msg.

## 2019-07-22 ENCOUNTER — Encounter: Payer: Medicaid Other | Admitting: Advanced Practice Midwife

## 2019-07-23 ENCOUNTER — Encounter: Payer: Self-pay | Admitting: Maternal Newborn

## 2019-07-23 ENCOUNTER — Ambulatory Visit (INDEPENDENT_AMBULATORY_CARE_PROVIDER_SITE_OTHER): Payer: Medicaid Other | Admitting: Maternal Newborn

## 2019-07-23 ENCOUNTER — Other Ambulatory Visit: Payer: Self-pay

## 2019-07-23 VITALS — BP 110/60 | Wt 130.0 lb

## 2019-07-23 DIAGNOSIS — Z3480 Encounter for supervision of other normal pregnancy, unspecified trimester: Secondary | ICD-10-CM

## 2019-07-23 DIAGNOSIS — Z3A10 10 weeks gestation of pregnancy: Secondary | ICD-10-CM

## 2019-07-23 DIAGNOSIS — Z1379 Encounter for other screening for genetic and chromosomal anomalies: Secondary | ICD-10-CM

## 2019-07-23 DIAGNOSIS — O34219 Maternal care for unspecified type scar from previous cesarean delivery: Secondary | ICD-10-CM

## 2019-07-23 LAB — POCT URINALYSIS DIPSTICK OB
Glucose, UA: NEGATIVE
POC,PROTEIN,UA: NEGATIVE

## 2019-07-23 NOTE — Progress Notes (Signed)
ROB- no concerns 

## 2019-07-23 NOTE — Patient Instructions (Signed)
First Trimester of Pregnancy The first trimester of pregnancy is from week 1 until the end of week 13 (months 1 through 3). A week after a sperm fertilizes an egg, the egg will implant on the wall of the uterus. This embryo will begin to develop into a baby. Genes from you and your partner will form the baby. The female genes will determine whether the baby will be a boy or a girl. At 6-8 weeks, the eyes and face will be formed, and the heartbeat can be seen on ultrasound. At the end of 12 weeks, all the baby's organs will be formed. Now that you are pregnant, you will want to do everything you can to have a healthy baby. Two of the most important things are to get good prenatal care and to follow your health care provider's instructions. Prenatal care is all the medical care you receive before the baby's birth. This care will help prevent, find, and treat any problems during the pregnancy and childbirth. Body changes during your first trimester Your body goes through many changes during pregnancy. The changes vary from woman to woman.  You may gain or lose a couple of pounds at first.  You may feel sick to your stomach (nauseous) and you may throw up (vomit). If the vomiting is uncontrollable, call your health care provider.  You may tire easily.  You may develop headaches that can be relieved by medicines. All medicines should be approved by your health care provider.  You may urinate more often. Painful urination may mean you have a bladder infection.  You may develop heartburn as a result of your pregnancy.  You may develop constipation because certain hormones are causing the muscles that push stool through your intestines to slow down.  You may develop hemorrhoids or swollen veins (varicose veins).  Your breasts may begin to grow larger and become tender. Your nipples may stick out more, and the tissue that surrounds them (areola) may become darker.  Your gums may bleed and may be  sensitive to brushing and flossing.  Dark spots or blotches (chloasma, mask of pregnancy) may develop on your face. This will likely fade after the baby is born.  Your menstrual periods will stop.  You may have a loss of appetite.  You may develop cravings for certain kinds of food.  You may have changes in your emotions from day to day, such as being excited to be pregnant or being concerned that something may go wrong with the pregnancy and baby.  You may have more vivid and strange dreams.  You may have changes in your hair. These can include thickening of your hair, rapid growth, and changes in texture. Some women also have hair loss during or after pregnancy, or hair that feels dry or thin. Your hair will most likely return to normal after your baby is born. What to expect at prenatal visits During a routine prenatal visit:  You will be weighed to make sure you and the baby are growing normally.  Your blood pressure will be taken.  Your abdomen will be measured to track your baby's growth.  The fetal heartbeat will be listened to between weeks 10 and 14 of your pregnancy.  Test results from any previous visits will be discussed. Your health care provider may ask you:  How you are feeling.  If you are feeling the baby move.  If you have had any abnormal symptoms, such as leaking fluid, bleeding, severe headaches, or abdominal   cramping.  If you are using any tobacco products, including cigarettes, chewing tobacco, and electronic cigarettes.  If you have any questions. Other tests that may be performed during your first trimester include:  Blood tests to find your blood type and to check for the presence of any previous infections. The tests will also be used to check for low iron levels (anemia) and protein on red blood cells (Rh antibodies). Depending on your risk factors, or if you previously had diabetes during pregnancy, you may have tests to check for high blood sugar  that affects pregnant women (gestational diabetes).  Urine tests to check for infections, diabetes, or protein in the urine.  An ultrasound to confirm the proper growth and development of the baby.  Fetal screens for spinal cord problems (spina bifida) and Down syndrome.  HIV (human immunodeficiency virus) testing. Routine prenatal testing includes screening for HIV, unless you choose not to have this test.  You may need other tests to make sure you and the baby are doing well. Follow these instructions at home: Medicines  Follow your health care provider's instructions regarding medicine use. Specific medicines may be either safe or unsafe to take during pregnancy.  Take a prenatal vitamin that contains at least 600 micrograms (mcg) of folic acid.  If you develop constipation, try taking a stool softener if your health care provider approves. Eating and drinking   Eat a balanced diet that includes fresh fruits and vegetables, whole grains, good sources of protein such as meat, eggs, or tofu, and low-fat dairy. Your health care provider will help you determine the amount of weight gain that is right for you.  Avoid raw meat and uncooked cheese. These carry germs that can cause birth defects in the baby.  Eating four or five small meals rather than three large meals a day may help relieve nausea and vomiting. If you start to feel nauseous, eating a few soda crackers can be helpful. Drinking liquids between meals, instead of during meals, also seems to help ease nausea and vomiting.  Limit foods that are high in fat and processed sugars, such as fried and sweet foods.  To prevent constipation: ? Eat foods that are high in fiber, such as fresh fruits and vegetables, whole grains, and beans. ? Drink enough fluid to keep your urine clear or pale yellow. Activity  Exercise only as directed by your health care provider. Most women can continue their usual exercise routine during  pregnancy. Try to exercise for 30 minutes at least 5 days a week. Exercising will help you: ? Control your weight. ? Stay in shape. ? Be prepared for labor and delivery.  Experiencing pain or cramping in the lower abdomen or lower back is a good sign that you should stop exercising. Check with your health care provider before continuing with normal exercises.  Try to avoid standing for long periods of time. Move your legs often if you must stand in one place for a long time.  Avoid heavy lifting.  Wear low-heeled shoes and practice good posture.  You may continue to have sex unless your health care provider tells you not to. Relieving pain and discomfort  Wear a good support bra to relieve breast tenderness.  Take warm sitz baths to soothe any pain or discomfort caused by hemorrhoids. Use hemorrhoid cream if your health care provider approves.  Rest with your legs elevated if you have leg cramps or low back pain.  If you develop varicose veins in   your legs, wear support hose. Elevate your feet for 15 minutes, 3-4 times a day. Limit salt in your diet. Prenatal care  Schedule your prenatal visits by the twelfth week of pregnancy. They are usually scheduled monthly at first, then more often in the last 2 months before delivery.  Write down your questions. Take them to your prenatal visits.  Keep all your prenatal visits as told by your health care provider. This is important. Safety  Wear your seat belt at all times when driving.  Make a list of emergency phone numbers, including numbers for family, friends, the hospital, and police and fire departments. General instructions  Ask your health care provider for a referral to a local prenatal education class. Begin classes no later than the beginning of month 6 of your pregnancy.  Ask for help if you have counseling or nutritional needs during pregnancy. Your health care provider can offer advice or refer you to specialists for help  with various needs.  Do not use hot tubs, steam rooms, or saunas.  Do not douche or use tampons or scented sanitary pads.  Do not cross your legs for long periods of time.  Avoid cat litter boxes and soil used by cats. These carry germs that can cause birth defects in the baby and possibly loss of the fetus by miscarriage or stillbirth.  Avoid all smoking, herbs, alcohol, and medicines not prescribed by your health care provider. Chemicals in these products affect the formation and growth of the baby.  Do not use any products that contain nicotine or tobacco, such as cigarettes and e-cigarettes. If you need help quitting, ask your health care provider. You may receive counseling support and other resources to help you quit.  Schedule a dentist appointment. At home, brush your teeth with a soft toothbrush and be gentle when you floss. Contact a health care provider if:  You have dizziness.  You have mild pelvic cramps, pelvic pressure, or nagging pain in the abdominal area.  You have persistent nausea, vomiting, or diarrhea.  You have a bad smelling vaginal discharge.  You have pain when you urinate.  You notice increased swelling in your face, hands, legs, or ankles.  You are exposed to fifth disease or chickenpox.  You are exposed to German measles (rubella) and have never had it. Get help right away if:  You have a fever.  You are leaking fluid from your vagina.  You have spotting or bleeding from your vagina.  You have severe abdominal cramping or pain.  You have rapid weight gain or loss.  You vomit blood or material that looks like coffee grounds.  You develop a severe headache.  You have shortness of breath.  You have any kind of trauma, such as from a fall or a car accident. Summary  The first trimester of pregnancy is from week 1 until the end of week 13 (months 1 through 3).  Your body goes through many changes during pregnancy. The changes vary from  woman to woman.  You will have routine prenatal visits. During those visits, your health care provider will examine you, discuss any test results you may have, and talk with you about how you are feeling. This information is not intended to replace advice given to you by your health care provider. Make sure you discuss any questions you have with your health care provider. Document Released: 09/24/2001 Document Revised: 09/12/2017 Document Reviewed: 09/11/2016 Elsevier Patient Education  2020 [employer]  

## 2019-07-23 NOTE — Progress Notes (Signed)
    Routine Prenatal Care Visit  Subjective  Nicole Hartman is a 23 y.o. G2P1001 at [redacted]w[redacted]d being seen today for ongoing prenatal care.  She is currently monitored for the following issues for this low-risk pregnancy and has PTSD (post-traumatic stress disorder); MDD (major depressive disorder), recurrent episode, severe ([place]); Menorrhagia; Supervision of other normal pregnancy, antepartum; and History of cesarean delivery, currently pregnant on their problem list.  ----------------------------------------------------------------------------------- Patient reports ongoing nausea and vomiting. Some relief with famotidine and Reglan that her primary care provider gave her. Vag. Bleeding: None.   ----------------------------------------------------------------------------------- The following portions of the patient's history were reviewed and updated as appropriate: allergies, current medications, past family history, past medical history, past social history, past surgical history and problem list. Problem list updated.   Objective  Blood pressure 110/60, weight 130 lb (59 kg), last menstrual period 05/17/2019, unknown if currently breastfeeding. Pregravid weight 133 lb (60.3 kg) Total Weight Gain -3 lb (-1.361 kg) Urinalysis: Urine dipstick shows negative for glucose, protein.   General:  Alert, oriented and cooperative. Patient is in no acute distress.  Skin: Skin is warm and dry. No rash noted.   Cardiovascular: Normal heart rate noted  Respiratory: Normal respiratory effort, no problems with respiration noted  Abdomen: Soft, gravid, appropriate for gestational age. Pain/Pressure: Absent     Pelvic:  Cervical exam deferred        Extremities: Normal range of motion.     Mental Status: Normal mood and affect. Normal behavior. Normal judgment and thought content.     Assessment   23 y.o. G2P1001 at [redacted]w[redacted]d, EDD 02/16/2020 by Ultrasound presenting for a routine prenatal visit.  Plan    pregnancy2 Problems (from 05/17/19 to present)    Problem Noted Resolved   Supervision of other normal pregnancy, antepartum 07/02/2019 by Rexene Agent, CNM No   Overview Addendum 07/08/2019  4:20 PM by Rexene Agent, [place] Prenatal Labs  Dating Ultrasound at 103w1d Blood type: O/Positive/-- (09/18 1411)   Genetic Screen 1 Screen:    AFP:     Quad:     NIPS: Antibody:Negative (09/18 1411)  Anatomic [place]  Rubella: <0.90 (09/18 1411) Varicella: Immune  GTT Early:               Third trimester:  RPR: Non Reactive (09/18 1411)   Rhogam  HBsAg: Negative (09/18 1411)   TDaP vaccine                        Flu Shot: 07/08/2019 HIV: Non Reactive (09/18 1411)   Baby Food                                GBS:   Contraception  Pap:  CBB     CS/VBAC    Support Person               MaterniT21 done today.  Discussed medications for nausea, will continue with current ones for now but may try Phenergan if needed.  Please refer to After Visit Summary for other counseling recommendations.   Return in about 4 weeks (around 08/20/2019) for ROB.  Avel Sensor, CNM 07/23/2019  11:55 AM

## 2019-07-29 LAB — MATERNIT 21 PLUS CORE, BLOOD
Fetal Fraction: 9
Result (T21): NEGATIVE
Trisomy 13 (Patau syndrome): NEGATIVE
Trisomy 18 (Edwards syndrome): NEGATIVE
Trisomy 21 (Down syndrome): NEGATIVE

## 2019-08-04 NOTE — Progress Notes (Signed)
Please prepare a gender envelope for pick-up at the front desk. Thanks!

## 2019-08-20 ENCOUNTER — Other Ambulatory Visit: Payer: Self-pay

## 2019-08-20 ENCOUNTER — Ambulatory Visit (INDEPENDENT_AMBULATORY_CARE_PROVIDER_SITE_OTHER): Payer: Medicaid Other | Admitting: Certified Nurse Midwife

## 2019-08-20 ENCOUNTER — Encounter: Payer: Self-pay | Admitting: Certified Nurse Midwife

## 2019-08-20 VITALS — BP 90/60 | Wt 129.0 lb

## 2019-08-20 DIAGNOSIS — Z3482 Encounter for supervision of other normal pregnancy, second trimester: Secondary | ICD-10-CM

## 2019-08-20 DIAGNOSIS — Z3480 Encounter for supervision of other normal pregnancy, unspecified trimester: Secondary | ICD-10-CM

## 2019-08-20 DIAGNOSIS — Z3A14 14 weeks gestation of pregnancy: Secondary | ICD-10-CM

## 2019-08-20 LAB — POCT URINALYSIS DIPSTICK OB
Glucose, UA: NEGATIVE
POC,PROTEIN,UA: NEGATIVE

## 2019-08-20 MED ORDER — ONDANSETRON HCL 4 MG PO TABS: 4.0000 mg | ORAL_TABLET | Freq: Three times a day (TID) | ORAL | 1 refills | Status: DC | PRN

## 2019-08-20 NOTE — Progress Notes (Signed)
C/O No appetite; has had a little bit of pain 'down there'.

## 2019-09-01 ENCOUNTER — Encounter: Payer: Self-pay | Admitting: Certified Nurse Midwife

## 2019-09-01 NOTE — Progress Notes (Signed)
ROB at 14wk2d: Decreased appetitie. Encouraged to "graze" throughout the day with food with a protein or fat to keep blood sugars up. Has lost 4# with the pregnancy. FHTs WNL Maternit21: diploid XY Desires [facility] in 2 weeks  Nicole Hartman, [place]

## 2019-09-03 ENCOUNTER — Encounter: Payer: Medicaid Other | Admitting: Advanced Practice Midwife

## 2019-09-20 ENCOUNTER — Telehealth: Payer: Self-pay

## 2019-09-20 NOTE — Telephone Encounter (Signed)
Your advice was excellent about protein and hydration- could be hypotension or hypoglycemia. She might be having sciatica pain/tingling. She can try usual back pain comfort measures to see if that helps with the tingling- exercise/stretch/heat/ice/abdominal support/epsom salt soaks. If things get worse she can come in sooner.

## 2019-09-20 NOTE — Telephone Encounter (Signed)
Pt calling; over weekend she got really lightheaded; had tingling sensation in left leg; has appt Fri.  Should she be seen sooner?  [phone]  Pt states she was also dizzy, couldn't stand, and fainted Sat; was really hot when she came to; was discombobulated the rest of the weekend.  Stayed out of work today b/c of it.  Adv to be sure she is eating protein and staying hydrated; to move slower with sitting up, sitting down, standing up.  Will send msg to provider about the tingling in left leg - could be baby on a nerve just right but will send to provider.

## 2019-09-21 NOTE — Telephone Encounter (Signed)
Left detailed msg. (pt's vm identified herself)

## 2019-09-24 ENCOUNTER — Other Ambulatory Visit: Payer: Medicaid Other

## 2019-09-24 ENCOUNTER — Encounter: Payer: Medicaid Other | Admitting: Obstetrics & Gynecology

## 2019-09-27 ENCOUNTER — Other Ambulatory Visit: Payer: Self-pay | Admitting: Family Medicine

## 2019-09-27 ENCOUNTER — Telehealth: Payer: Self-pay | Admitting: *Deleted

## 2019-09-27 ENCOUNTER — Other Ambulatory Visit: Payer: Medicaid Other

## 2019-09-27 ENCOUNTER — Encounter: Payer: Medicaid Other | Admitting: Obstetrics & Gynecology

## 2019-09-27 DIAGNOSIS — Z3482 Encounter for supervision of other normal pregnancy, second trimester: Secondary | ICD-10-CM

## 2019-10-01 ENCOUNTER — Ambulatory Visit: Payer: Medicaid Other

## 2019-10-13 ENCOUNTER — Other Ambulatory Visit: Payer: Self-pay

## 2019-10-13 ENCOUNTER — Encounter: Payer: Self-pay | Admitting: Advanced Practice Midwife

## 2019-10-13 ENCOUNTER — Ambulatory Visit (INDEPENDENT_AMBULATORY_CARE_PROVIDER_SITE_OTHER): Payer: Medicaid Other

## 2019-10-13 ENCOUNTER — Ambulatory Visit (INDEPENDENT_AMBULATORY_CARE_PROVIDER_SITE_OTHER): Payer: Medicaid Other | Admitting: Advanced Practice Midwife

## 2019-10-13 VITALS — BP 120/70 | Wt 141.0 lb

## 2019-10-13 DIAGNOSIS — Z363 Encounter for antenatal screening for malformations: Secondary | ICD-10-CM

## 2019-10-13 DIAGNOSIS — Z131 Encounter for screening for diabetes mellitus: Secondary | ICD-10-CM

## 2019-10-13 DIAGNOSIS — Z113 Encounter for screening for infections with a predominantly sexual mode of transmission: Secondary | ICD-10-CM

## 2019-10-13 DIAGNOSIS — Z130 Encounter for screening for diseases of the blood and blood-forming organs and certain disorders involving the immune mechanism: Secondary | ICD-10-CM

## 2019-10-13 DIAGNOSIS — Z3482 Encounter for supervision of other normal pregnancy, second trimester: Secondary | ICD-10-CM

## 2019-10-13 DIAGNOSIS — Z3480 Encounter for supervision of other normal pregnancy, unspecified trimester: Secondary | ICD-10-CM

## 2019-10-13 DIAGNOSIS — Z3A22 22 weeks gestation of pregnancy: Secondary | ICD-10-CM

## 2019-10-13 LAB — POCT URINALYSIS DIPSTICK OB
Glucose, UA: NEGATIVE
POC,PROTEIN,UA: NEGATIVE

## 2019-10-13 NOTE — Progress Notes (Addendum)
Routine Prenatal Care Visit  Subjective  Nicole Hartman is a 23 y.o. G2P1001 at [redacted]w[redacted]d being seen today for ongoing prenatal care.  She is currently monitored for the following issues for this low-risk pregnancy and has PTSD (post-traumatic stress disorder); MDD (major depressive disorder), recurrent episode, severe ([facility]); Menorrhagia; Supervision of other normal pregnancy, antepartum; and History of cesarean delivery, currently pregnant on their problem list.  ----------------------------------------------------------------------------------- Patient reports feeling faint while in u/s. We discussed postural hypotension and the importance of staying well hydrated.   Contractions: Irregular. Vag. Bleeding: None.  Movement: Present. Leaking Fluid denies.  ----------------------------------------------------------------------------------- The following portions of the patient's history were reviewed and updated as appropriate: allergies, current medications, past family history, past medical history, past social history, past surgical history and problem list. Problem list updated.  Objective  Blood pressure 120/70, weight 141 lb (64 kg), last menstrual period 05/17/2019, unknown if currently breastfeeding. Pregravid weight 133 lb (60.3 kg) Total Weight Gain 8 lb (3.629 kg) Urinalysis: Urine Protein Negative  Urine Glucose Negative  Fetal Status: Fetal Heart Rate (bpm): 132 Fundal Height: 22 cm Movement: Present  Presentation: Vertex   Anatomy scan: complete, normal, female, cephalic, anterior placenta AFI subjectively mildly elevated with deepest vertical pocket 9 cm  General:  Alert, oriented and cooperative. Patient is in no acute distress.  Skin: Skin is warm and dry. No rash noted.   Cardiovascular: Normal heart rate noted  Respiratory: Normal respiratory effort, no problems with respiration noted  Abdomen: Soft, gravid, appropriate for gestational age. Pain/Pressure: Present     Pelvic:   Cervical exam deferred        Extremities: Normal range of motion.  Edema: None  Mental Status: Normal mood and affect. Normal behavior. Normal judgment and thought content.   Assessment   23 y.o. G2P1001 at [redacted]w[redacted]d by  02/16/2020, by Ultrasound presenting for routine prenatal visit  Plan   pregnancy2 Problems (from 05/17/19 to present)    Problem Noted Resolved   Supervision of other normal pregnancy, antepartum 07/02/2019 by Oswaldo Conroy, CNM No   Overview Addendum 09/01/2019 11:03 PM by Farrel Conners, CNM    [facility]inic [facility] Prenatal Labs  Dating Ultrasound at [redacted]w[redacted]d Blood type: O/Positive/-- (09/18 1411)   Genetic Screen 1 Screen:    AFP:     Quad:     NIPS:diploid XY Antibody:Negative (09/18 1411)  Anatomic [place]  Rubella: <0.90 (09/18 1411) Varicella: Immune  GTT Early:               Third trimester:  RPR: Non Reactive (09/18 1411)   Rhogam  HBsAg: Negative (09/18 1411)   TDaP vaccine                        Flu Shot: 07/08/2019 HIV: Non Reactive (09/18 1411)   Baby Food                                GBS:   Contraception  Pap:  CBB     CS/VBAC    Support Person                  Preterm labor symptoms and general obstetric precautions including but not limited to vaginal bleeding, contractions, leaking of fluid and fetal movement were reviewed in detail with the patient.  Follow up AFI in 3rd trimester   Return in about 4 weeks (around 11/10/2019) for 28  wk labs and rob.  Rod Can, CNM 10/13/2019 4:48 PM

## 2019-11-10 ENCOUNTER — Other Ambulatory Visit: Payer: Medicaid Other

## 2019-11-10 ENCOUNTER — Encounter: Payer: Medicaid Other | Admitting: Obstetrics & Gynecology

## 2019-11-12 ENCOUNTER — Other Ambulatory Visit: Payer: Self-pay

## 2019-11-12 ENCOUNTER — Ambulatory Visit (INDEPENDENT_AMBULATORY_CARE_PROVIDER_SITE_OTHER): Payer: Medicaid Other | Admitting: Obstetrics and Gynecology

## 2019-11-12 ENCOUNTER — Other Ambulatory Visit: Payer: Medicaid Other

## 2019-11-12 ENCOUNTER — Encounter: Payer: Self-pay | Admitting: Obstetrics and Gynecology

## 2019-11-12 VITALS — BP 118/74 | Wt 142.0 lb

## 2019-11-12 DIAGNOSIS — O34219 Maternal care for unspecified type scar from previous cesarean delivery: Secondary | ICD-10-CM

## 2019-11-12 DIAGNOSIS — Z113 Encounter for screening for infections with a predominantly sexual mode of transmission: Secondary | ICD-10-CM

## 2019-11-12 DIAGNOSIS — Z3480 Encounter for supervision of other normal pregnancy, unspecified trimester: Secondary | ICD-10-CM

## 2019-11-12 DIAGNOSIS — Z130 Encounter for screening for diseases of the blood and blood-forming organs and certain disorders involving the immune mechanism: Secondary | ICD-10-CM

## 2019-11-12 DIAGNOSIS — Z131 Encounter for screening for diabetes mellitus: Secondary | ICD-10-CM

## 2019-11-12 DIAGNOSIS — Z3A26 26 weeks gestation of pregnancy: Secondary | ICD-10-CM

## 2019-11-12 NOTE — Progress Notes (Signed)
Routine Prenatal Care Visit  Subjective  Nicole Hartman is a 24 y.o. G2P1001 at [redacted]w[redacted]d being seen today for ongoing prenatal care.  She is currently monitored for the following issues for this low-risk pregnancy and has PTSD (post-traumatic stress disorder); MDD (major depressive disorder), recurrent episode, severe ([facility]); Menorrhagia; Supervision of other normal pregnancy, antepartum; and History of cesarean delivery, currently pregnant on their problem list.  ----------------------------------------------------------------------------------- Patient reports no complaints.   Contractions: Not present. Vag. Bleeding: None.  Movement: Present. Leaking Fluid denies.  ----------------------------------------------------------------------------------- The following portions of the patient's history were reviewed and updated as appropriate: allergies, current medications, past family history, past medical history, past social history, past surgical history and problem list. Problem list updated.  Objective  Blood pressure 118/74, weight 142 lb (64.4 kg), last menstrual period 05/17/2019, unknown if currently breastfeeding. Pregravid weight 133 lb (60.3 kg) Total Weight Gain 9 lb (4.082 kg) Urinalysis: Urine Protein    Urine Glucose    Fetal Status: Fetal Heart Rate (bpm): 140 Fundal Height: 26 cm Movement: Present     General:  Alert, oriented and cooperative. Patient is in no acute distress.  Skin: Skin is warm and dry. No rash noted.   Cardiovascular: Normal heart rate noted  Respiratory: Normal respiratory effort, no problems with respiration noted  Abdomen: Soft, gravid, appropriate for gestational age. Pain/Pressure: Absent     Pelvic:  Cervical exam deferred        Extremities: Normal range of motion.  Edema: None  Mental Status: Normal mood and affect. Normal behavior. Normal judgment and thought content.   Assessment   24 y.o. G2P1001 at [redacted]w[redacted]d by  02/16/2020, by Ultrasound presenting  for routine prenatal visit  Plan   pregnancy2 Problems (from 05/17/19 to present)    Problem Noted Resolved   Supervision of other normal pregnancy, antepartum 07/02/2019 by Oswaldo Conroy, CNM No   Overview Addendum 09/01/2019 11:03 PM by Farrel Conners, CNM    [facility] Prenatal Labs  Dating Ultrasound at [redacted]w[redacted]d Blood type: O/Positive/-- (09/18 1411)   Genetic Screen 1 Screen:    AFP:     Quad:     NIPS:diploid XY Antibody:Negative (09/18 1411)  Anatomic [place]  Rubella: <0.90 (09/18 1411) Varicella: Immune  GTT Early:               Third trimester:  RPR: Non Reactive (09/18 1411)   Rhogam  HBsAg: Negative (09/18 1411)   TDaP vaccine                        Flu Shot: 07/08/2019 HIV: Non Reactive (09/18 1411)   Baby Food                                GBS:   Contraception  Pap:  CBB     CS/VBAC    Support Person                  Preterm labor symptoms and general obstetric precautions including but not limited to vaginal bleeding, contractions, leaking of fluid and fetal movement were reviewed in detail with the patient. Please refer to After Visit Summary for other counseling recommendations.   - 28 week labs today - discussed TOLAC(VBAC attempt) in detail today. Patient provided with consent form detailing the risks and benefits of attempting TOLAC vs elective repeat c-section.   Return in about 2 weeks (around  11/26/2019) for Routine Prenatal Appointment.  Prentice Docker, MD, [facility], [facility] 11/12/2019 10:47 AM

## 2019-11-13 LAB — 28 WEEK RH+PANEL
Basophils Absolute: 0 10*3/uL (ref 0.0–0.2)
Basos: 1 %
EOS (ABSOLUTE): 0.1 10*3/uL (ref 0.0–0.4)
Eos: 1 %
Gestational Diabetes Screen: 103 mg/dL (ref 65–139)
HIV Screen 4th Generation wRfx: NONREACTIVE
Hematocrit: 31.8 % — ABNORMAL LOW (ref 34.0–46.6)
Hemoglobin: 10 g/dL — ABNORMAL LOW (ref 11.1–15.9)
Immature Grans (Abs): 0 10*3/uL (ref 0.0–0.1)
Immature Granulocytes: 1 %
Lymphocytes Absolute: 1.4 10*3/uL (ref 0.7–3.1)
Lymphs: 25 %
MCH: 27.2 pg (ref 26.6–33.0)
MCHC: 31.4 g/dL — ABNORMAL LOW (ref 31.5–35.7)
MCV: 87 fL (ref 79–97)
Monocytes Absolute: 0.3 10*3/uL (ref 0.1–0.9)
Monocytes: 6 %
Neutrophils Absolute: 3.6 10*3/uL (ref 1.4–7.0)
Neutrophils: 66 %
Platelets: 215 10*3/uL (ref 150–450)
RBC: 3.67 x10E6/uL — ABNORMAL LOW (ref 3.77–5.28)
RDW: 12.5 % (ref 11.7–15.4)
RPR Ser Ql: NONREACTIVE
WBC: 5.4 10*3/uL (ref 3.4–10.8)

## 2019-11-18 ENCOUNTER — Encounter: Payer: Self-pay | Admitting: Obstetrics & Gynecology

## 2019-11-18 ENCOUNTER — Other Ambulatory Visit: Payer: Self-pay

## 2019-11-18 ENCOUNTER — Telehealth: Payer: Self-pay

## 2019-11-18 ENCOUNTER — Ambulatory Visit (INDEPENDENT_AMBULATORY_CARE_PROVIDER_SITE_OTHER): Payer: Medicaid Other | Admitting: Obstetrics & Gynecology

## 2019-11-18 VITALS — BP 110/70 | Wt 147.0 lb

## 2019-11-18 DIAGNOSIS — R42 Dizziness and giddiness: Secondary | ICD-10-CM

## 2019-11-18 NOTE — Telephone Encounter (Signed)
Pt calling; BP elevated this week - high 130s/80s; is this normal?  Has felt a little faint c kind of hurting.  [phone] Pt states her feet aren't really swollen but they are throbbing; is having ctxs but not more than four/hr; top part of stomach hurts; intense pressure c voiding.  Pt tx'd to [facility] front desk for scheduling c PH this pm.

## 2019-11-18 NOTE — Progress Notes (Signed)
Obstetrics & Gynecology Office Visit  History of Present Illness: 24 y.o. G2P1001 being seen for follow up blood pressure check today.  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  .  She is currently on no antihypertensives.  She reports no current symptoms attributable to her blood pressure.  Medication list reviewed PNV.  Past Medical History:  Past Medical History:  Diagnosis Date  . Anemia   . Asthma   . Cesarean delivery delivered 03/02/2016    Past Surgical History:  Past Surgical History:  Procedure Laterality Date  . CESAREAN SECTION N/A 03/01/2016   Procedure: CESAREAN SECTION;  Surgeon: Will Bonnet, MD;  Location: [facility] ORS;  Service: Obstetrics;  Laterality: N/A;    Gynecologic History: Patient's last menstrual period was 05/17/2019 (approximate).  Obstetric History: G2P1001  Family History:  Family History  Problem Relation Age of Onset  . Breast cancer Maternal Aunt   . Cancer Maternal Grandfather     Social History:  Social History   Socioeconomic History  . Marital status: Married    Spouse name: Not on file  . Number of children: Not on file  . Years of education: Not on file  . Highest education level: Not on file  Occupational History  . Not on file  Tobacco Use  . Smoking status: Never Smoker  . Smokeless tobacco: Never Used  Substance and Sexual Activity  . Alcohol use: No  . Drug use: No  . Sexual activity: Yes  Other Topics Concern  . Not on file  Social History Narrative  . Not on file   Social Determinants of Health   Financial Resource Strain:   . Difficulty of Paying Living Expenses: Not on file  Food Insecurity:   . Worried About Charity fundraiser in the Last Year: Not on file  . Ran Out of Food in the Last Year: Not on file  Transportation  Needs:   . Lack of Transportation (Medical): Not on file  . Lack of Transportation (Non-Medical): Not on file  Physical Activity:   . Days of Exercise per Week: Not on file  . Minutes of Exercise per Session: Not on file  Stress:   . Feeling of Stress : Not on file  Social Connections:   . Frequency of Communication with Friends and Family: Not on file  . Frequency of Social Gatherings with Friends and Family: Not on file  . Attends Religious Services: Not on file  . Active Member of Clubs or Organizations: Not on file  . Attends Archivist Meetings: Not on file  . Marital Status: Not on file  Intimate Partner Violence:   . Fear of Current or Ex-Partner: Not on file  . Emotionally Abused: Not on file  . Physically Abused: Not on file  . Sexually Abused: Not on file    Allergies:  No Known Allergies  Medications: Prior to Admission medications   Medication Sig Start Date End Date Taking? Authorizing Provider  albuterol (PROAIR HFA) 108 (90 BASE) MCG/ACT inhaler Inhale 1 puff into the lungs 3 (three) times daily as needed for wheezing (for cough).    [provider]  famotidine (PEPCID) 20 MG tablet Take 20 mg by mouth daily.    [provider]  ondansetron (ZOFRAN) 4 MG tablet Take 1  tablet (4 mg total) by mouth every 8 (eight) hours as needed for nausea or vomiting. 08/20/19   Farrel Conners, CNM  Prenatal Vit-Fe Fumarate-FA (PNV PRENATAL PLUS MULTIVITAMIN) 27-1 MG TABS Take by mouth.    [provider]    ROS  Physical Exam Blood pressure 110/70, weight 147 lb (66.7 kg), last menstrual period 05/17/2019, unknown if currently breastfeeding.  Patient's last menstrual period was 05/17/2019 (approximate).  General: NAD HEENT: normocephalic, anicteric Pulmonary: No increased work of breathing Cardiovascular: RRR, distal pulses 2+ Extremities: noedema, no erythema, no tenderness Neurologic: Grossly intact Psychiatric: mood appropriate,  affect full  UA NEG  Assessment: 24 y.o. G2P1001 presenting for blood pressure evaluation today  Plan: 1) Blood pressure - blood pressure at today's visit is normotensive.  As a result antihypertensive therapy is currently not warranted. - additional blood work was not obtained   2) UA neg, no s/sx UTI  3) Dizziness, monitor  Plan hydration, rest, and monitoring of sx's  Annamarie Major, MD, [facility], [facility] 11/18/2019  4:19 PM

## 2019-11-25 ENCOUNTER — Encounter: Payer: Self-pay | Admitting: Obstetrics and Gynecology

## 2019-11-25 ENCOUNTER — Ambulatory Visit (INDEPENDENT_AMBULATORY_CARE_PROVIDER_SITE_OTHER): Payer: Medicaid Other | Admitting: Obstetrics and Gynecology

## 2019-11-25 ENCOUNTER — Other Ambulatory Visit: Payer: Self-pay

## 2019-11-25 VITALS — BP 114/67 | Wt 148.0 lb

## 2019-11-25 DIAGNOSIS — Z3483 Encounter for supervision of other normal pregnancy, third trimester: Secondary | ICD-10-CM

## 2019-11-25 DIAGNOSIS — Z3480 Encounter for supervision of other normal pregnancy, unspecified trimester: Secondary | ICD-10-CM

## 2019-11-25 DIAGNOSIS — Z3A28 28 weeks gestation of pregnancy: Secondary | ICD-10-CM

## 2019-11-25 DIAGNOSIS — O34219 Maternal care for unspecified type scar from previous cesarean delivery: Secondary | ICD-10-CM

## 2019-11-25 LAB — POCT URINALYSIS DIPSTICK OB
Glucose, UA: NEGATIVE
POC,PROTEIN,UA: NEGATIVE

## 2019-11-25 NOTE — Progress Notes (Signed)
Routine Prenatal Care Visit  Subjective  Nicole Hartman is a 24 y.o. G2P1001 at [redacted]w[redacted]d being seen today for ongoing prenatal care.  She is currently monitored for the following issues for this low-risk pregnancy and has PTSD (post-traumatic stress disorder); MDD (major depressive disorder), recurrent episode, severe ([place]); Menorrhagia; Supervision of other normal pregnancy, antepartum; and History of cesarean delivery, currently pregnant on their problem list.  ----------------------------------------------------------------------------------- Patient reports no complaints.   Contractions: Not present. Vag. Bleeding: None.  Movement: Present. Leaking Fluid denies.  Reports feeling bad at times ----------------------------------------------------------------------------------- The following portions of the patient's history were reviewed and updated as appropriate: allergies, current medications, past family history, past medical history, past social history, past surgical history and problem list. Problem list updated.  Objective  Blood pressure 114/67, weight 148 lb (67.1 kg), last menstrual period 05/17/2019, unknown if currently breastfeeding. Pregravid weight 133 lb (60.3 kg) Total Weight Gain 15 lb (6.804 kg) Urinalysis: Urine Protein Negative  Urine Glucose Negative  Fetal Status: Fetal Heart Rate (bpm): 140 Fundal Height: 38 cm Movement: Present     General:  Alert, oriented and cooperative. Patient is in no acute distress.  Skin: Skin is warm and dry. No rash noted.   Cardiovascular: Normal heart rate noted  Respiratory: Normal respiratory effort, no problems with respiration noted  Abdomen: Soft, gravid, appropriate for gestational age. Pain/Pressure: Absent     Pelvic:  Cervical exam deferred        Extremities: Normal range of motion.  Edema: None  Mental Status: Normal mood and affect. Normal behavior. Normal judgment and thought content.   Assessment   24 y.o. G2P1001 [redacted]w[redacted]d by  02/16/2020, by Ultrasound presenting for routine prenatal visit  Plan   pregnancy2 Problems (from 05/17/19 to present)    Problem Noted Resolved   Supervision of other normal pregnancy, antepartum 07/02/2019 by Rexene Agent, CNM No   Overview Addendum 09/01/2019 11:03 PM by Dalia Heading, [place] Prenatal Labs  Dating Ultrasound at [redacted]w[redacted]d Blood type: O/Positive/-- (09/18 1411)   Genetic Screen 1 Screen:    AFP:     Quad:     NIPS:diploid XY Antibody:Negative (09/18 1411)  Anatomic [place]  Rubella: <0.90 (09/18 1411) Varicella: Immune  GTT Early:               Third trimester:  RPR: Non Reactive (09/18 1411)   Rhogam  HBsAg: Negative (09/18 1411)   TDaP vaccine                        Flu Shot: 07/08/2019 HIV: Non Reactive (09/18 1411)   Baby Food                                GBS:   Contraception  Pap:  CBB     CS/VBAC    Support Person                  Preterm labor symptoms and general obstetric precautions including but not limited to vaginal bleeding, contractions, leaking of fluid and fetal movement were reviewed in detail with the patient. Please refer to After Visit Summary for other counseling recommendations.   - repeat c-section 4/30 at noon scheduled.   Return in about 2 weeks (around 12/09/2019) for Ultrasound for AFI/Growth and routine prenatal.  Prentice Docker, MD, [place], [place]  11/25/2019 3:23 PM

## 2019-11-29 ENCOUNTER — Telehealth: Payer: Medicaid Other | Admitting: Family

## 2019-11-29 ENCOUNTER — Telehealth: Payer: Self-pay | Admitting: Obstetrics and Gynecology

## 2019-11-29 DIAGNOSIS — Z20822 Contact with and (suspected) exposure to covid-19: Secondary | ICD-10-CM

## 2019-11-29 MED ORDER — FLUTICASONE PROPIONATE 50 MCG/ACT NA SUSP: 2.0000 | Freq: Every day | NASAL | 6 refills | Status: DC

## 2019-11-29 NOTE — Telephone Encounter (Signed)
Patient calling due to missed call. Please advise

## 2019-11-29 NOTE — Telephone Encounter (Signed)
Left message for patient to return call.

## 2019-11-29 NOTE — Telephone Encounter (Signed)
Confirmed c/s w/ patient for 02/11/20 H&P 4/19 8:50am w/ Jean Rosenthal She will receive appointment day/time for pre-op phone call and confirmed Covid testing on 4/28 at 9-10am at [address] She knows to drive up, wear a mask and then to quarantine at home until day of surgery

## 2019-11-29 NOTE — Progress Notes (Signed)
E-Visit for Corona Virus Screening  Your current symptoms could be consistent with the coronavirus.  Many health care providers can now test patients at their office but not all are.  [facility] has multiple testing sites. For information on our COVID testing locations and hours go to [email or web address]  We are enrolling you in our MyChart Home Monitoring for COVID19 . Daily you will receive a questionnaire within the MyChart website. Our COVID 19 response team will be monitoring your responses daily.  Testing Information: The COVID-19 Community Testing sites will begin testing BY APPOINTMENT ONLY.  You can schedule online at [email or web address]  If you do not have access to a smart phone or computer you may call [phone] for an appointment.   Additional testing sites in the Community:  . For [facility] Testing sites in [facility]  FarmerBuys.[email or web address]  . For Pop-up testing sites in [place]  [email or web address]  . For Testing sites with regular hours [email or web address]  . For [facility] [email or web address]  . For [facility] [email or web address].dk  . For [facility] testing in [place] and [employer] [email or web address].dk  . For [facility] testing in [facility]   [email or web address]  For  more information about community testing call [phone]   Please quarantine yourself while awaiting your test results. Please stay home for a minimum of 10 days from the first day of illness with improving symptoms and you have had 24 hours of no fever (without the use of Tylenol (Acetaminophen)  Motrin (Ibuprofen) or any fever reducing medication).  Also - Do not get tested prior to returning to work because once you have had a positive test the test can stay positive for more then a month in some cases.   You should wear a mask or cloth face covering over your nose and mouth if you must be around other people or animals, including pets (even at home). Try to stay at least 6 feet away from other people. This will protect the people around you.  Please continue good preventive care measures, including:  frequent hand-washing, avoid touching your face, cover coughs/sneezes, stay out of crowds and keep a 6 foot distance from others.  COVID-19 is a respiratory illness with symptoms that are similar to the flu. Symptoms are typically mild to moderate, but there have been cases of severe illness and death due to the virus.   The following symptoms may appear 2-14 days after exposure: . Fever . Cough . Shortness of breath or difficulty breathing . Chills . Repeated shaking with chills . Muscle pain . Headache . Sore throat . New loss of taste or smell . Fatigue . Congestion or runny nose . Nausea or vomiting . Diarrhea  Go to the nearest hospital ED for assessment if fever/cough/breathlessness are severe or illness seems like a threat to life.  It is vitally important that if you feel that you have an infection such as this virus or any other virus that you stay home and away from places where you may spread it to others.  You should avoid contact with people age 94 and older.   You can use medication such as A prescription inhaler called Albuterol MDI 90 mcg /actuation 2 puffs every 4 hours as needed for shortness of breath, wheezing, cough and flonase nasal spray that you will spray in each nostril.   You may also take acetaminophen (Tylenol) as needed for fever.  Reduce your risk of any infection by using the same precautions  used for avoiding the common cold or flu:  Marland Kitchen Wash your hands  often with soap and warm water for at least 20 seconds.  If soap and water are not readily available, use an alcohol-based hand sanitizer with at least 60% alcohol.  . If coughing or sneezing, cover your mouth and nose by coughing or sneezing into the elbow areas of your shirt or coat, into a tissue or into your sleeve (not your hands). . Avoid shaking hands with others and consider head nods or verbal greetings only. . Avoid touching your eyes, nose, or mouth with unwashed hands.  . Avoid close contact with people who are sick. . Avoid places or events with large numbers of people in one location, like concerts or sporting events. . Carefully consider travel plans you have or are making. . If you are planning any travel outside or inside the [place], visit the CDC's Travelers' Health webpage for the latest health notices. . If you have some symptoms but not all symptoms, continue to monitor at home and seek medical attention if your symptoms worsen. . If you are having a medical emergency, call 911.  HOME CARE . Only take medications as instructed by your medical team. . Drink plenty of fluids and get plenty of rest. . A steam or ultrasonic humidifier can help if you have congestion.   GET HELP RIGHT AWAY IF YOU HAVE EMERGENCY WARNING SIGNS** FOR COVID-19. If you or someone is showing any of these signs seek emergency medical care immediately. Call 911 or proceed to your closest emergency facility if: . You develop worsening high fever. . Trouble breathing . Bluish lips or face . Persistent pain or pressure in the chest . New confusion . Inability to wake or stay awake . You cough up blood. . Your symptoms become more severe  **This list is not all possible symptoms. Contact your medical provider for any symptoms that are sever or concerning to you.  MAKE SURE YOU   Understand these instructions.  Will watch your condition.  Will get help right away if you are not doing well or get  worse.  Your e-visit answers were reviewed by a board certified advanced clinical practitioner to complete your personal care plan.  Depending on the condition, your plan could have included both over the counter or prescription medications.  If there is a problem please reply once you have received a response from your provider.  Your safety is important to [place].  If you have drug allergies check your prescription carefully.    You can use MyChart to ask questions about today's visit, request a non-urgent call back, or ask for a work or school excuse for 24 hours related to this e-Visit. If it has been greater than 24 hours you will need to follow up with your provider, or enter a new e-Visit to address those concerns. You will get an e-mail in the next two days asking about your experience.  I hope that your e-visit has been valuable and will speed your recovery. Thank you for using e-visits.   Approximately 5 minutes was spent documenting and reviewing patient's chart.

## 2019-11-30 ENCOUNTER — Encounter (INDEPENDENT_AMBULATORY_CARE_PROVIDER_SITE_OTHER): Payer: Self-pay

## 2019-12-01 ENCOUNTER — Encounter (INDEPENDENT_AMBULATORY_CARE_PROVIDER_SITE_OTHER): Payer: Self-pay

## 2019-12-01 ENCOUNTER — Telehealth: Payer: Self-pay

## 2019-12-01 ENCOUNTER — Telehealth: Payer: Self-pay | Admitting: *Deleted

## 2019-12-01 NOTE — Telephone Encounter (Signed)
Patient called and hung up before transfer. Returned call to patient who triggered BPA for new diarrhea on the patient questionnaire.  Left VM for her to return call for more information or Questions to [phone]

## 2019-12-01 NOTE — Telephone Encounter (Signed)
Attempted to contact patient . Started to leave message but VM failed. BPA triggered for new diarrhea

## 2019-12-01 NOTE — Telephone Encounter (Signed)
Left voicemail message for patient as BPA triggered new symptom of diarrhea.  Left message for patient to return call at [phone].

## 2019-12-01 NOTE — Telephone Encounter (Signed)
Left voicemail message for patient as BPA triggered new symptom of diarrhea.  Left message for patient to return call at [phone]. Will send patient some information / message via mychart.

## 2019-12-01 NOTE — Telephone Encounter (Signed)
Pt calling; what to take for a cold?; has been sick since Monday; was tested for Covid Monday; has alka seltzer pkts.  [phone]  Pt does not have covid test results yet.  Adv if it is positive to just treat the sxs.  For cough - plain robitussin/plain musinex; sore throat - warm salt water gargles, sucrets, Hall's cough crops; sinuses - plain sudafed, sip hot beverages/soup; nasal congestion - saline nasal spray; push fluids and rest.   Adv alka seltzer is on the safe meds list.  To let us know if no better.

## 2019-12-02 ENCOUNTER — Encounter (INDEPENDENT_AMBULATORY_CARE_PROVIDER_SITE_OTHER): Payer: Self-pay

## 2019-12-05 ENCOUNTER — Encounter (INDEPENDENT_AMBULATORY_CARE_PROVIDER_SITE_OTHER): Payer: Self-pay

## 2019-12-08 ENCOUNTER — Encounter (INDEPENDENT_AMBULATORY_CARE_PROVIDER_SITE_OTHER): Payer: Self-pay

## 2019-12-09 ENCOUNTER — Other Ambulatory Visit: Payer: Self-pay

## 2019-12-09 ENCOUNTER — Encounter (INDEPENDENT_AMBULATORY_CARE_PROVIDER_SITE_OTHER): Payer: Self-pay

## 2019-12-09 ENCOUNTER — Ambulatory Visit (INDEPENDENT_AMBULATORY_CARE_PROVIDER_SITE_OTHER): Payer: Medicaid Other | Admitting: Advanced Practice Midwife

## 2019-12-09 ENCOUNTER — Ambulatory Visit (INDEPENDENT_AMBULATORY_CARE_PROVIDER_SITE_OTHER): Payer: Medicaid Other

## 2019-12-09 ENCOUNTER — Encounter: Payer: Self-pay | Admitting: Advanced Practice Midwife

## 2019-12-09 VITALS — BP 118/74 | Wt 146.0 lb

## 2019-12-09 DIAGNOSIS — Z362 Encounter for other antenatal screening follow-up: Secondary | ICD-10-CM | POA: Diagnosis not present

## 2019-12-09 DIAGNOSIS — Z3A30 30 weeks gestation of pregnancy: Secondary | ICD-10-CM

## 2019-12-09 DIAGNOSIS — O34211 Maternal care for low transverse scar from previous cesarean delivery: Secondary | ICD-10-CM

## 2019-12-09 DIAGNOSIS — O321XX0 Maternal care for breech presentation, not applicable or unspecified: Secondary | ICD-10-CM | POA: Diagnosis not present

## 2019-12-09 DIAGNOSIS — O09893 Supervision of other high risk pregnancies, third trimester: Secondary | ICD-10-CM

## 2019-12-09 DIAGNOSIS — O403XX0 Polyhydramnios, third trimester, not applicable or unspecified: Secondary | ICD-10-CM

## 2019-12-09 DIAGNOSIS — Z3480 Encounter for supervision of other normal pregnancy, unspecified trimester: Secondary | ICD-10-CM

## 2019-12-09 NOTE — Progress Notes (Signed)
U/s today. No vb. No lof. TDAP at nv per pt

## 2019-12-09 NOTE — Progress Notes (Signed)
  Routine Prenatal Care Visit  Subjective  Nicole Hartman is a 24 y.o. G2P1001 at [redacted]w[redacted]d being seen today for ongoing prenatal care.  She is currently monitored for the following issues for this low-risk pregnancy and has PTSD (post-traumatic stress disorder); MDD (major depressive disorder), recurrent episode, severe ([facility]); Menorrhagia; Supervision of other normal pregnancy, antepartum; History of cesarean delivery, currently pregnant; and Polyhydramnios affecting pregnancy in third trimester on their problem list.  ----------------------------------------------------------------------------------- Patient reports occasional abdominal cramping.   Contractions: Not present. Vag. Bleeding: None.  Movement: Present. Leaking Fluid denies.  ----------------------------------------------------------------------------------- The following portions of the patient's history were reviewed and updated as appropriate: allergies, current medications, past family history, past medical history, past social history, past surgical history and problem list. Problem list updated.  Objective  Blood pressure 118/74, weight 146 lb (66.2 kg), last menstrual period 05/17/2019 Pregravid weight 133 lb (60.3 kg) Total Weight Gain 13 lb (5.897 kg) Urinalysis: Urine Protein    Urine Glucose    Fetal Status: Fetal Heart Rate (bpm): 134 Fundal Height: 31 cm Movement: Present  Presentation: Complete Breech   Growth/AFI: 68.8%, AC 94.6%, 3#14oz, AFI 26.3 cm  General:  Alert, oriented and cooperative. Patient is in no acute distress.  Skin: Skin is warm and dry. No rash noted.   Cardiovascular: Normal heart rate noted  Respiratory: Normal respiratory effort, no problems with respiration noted  Abdomen: Soft, gravid, appropriate for gestational age. Pain/Pressure: Absent     Pelvic:  Cervical exam deferred        Extremities: Normal range of motion.  Edema: None  Mental Status: Normal mood and affect. Normal behavior.  Normal judgment and thought content.   Assessment   24 y.o. G2P1001 at [redacted]w[redacted]d by  02/16/2020, by Ultrasound presenting for routine prenatal visit  Plan      History of cesarean delivery, currently pregnant 07/02/2019 by Nicole Hartman, CNM No     Scheduled for repeat c/s 02/11/20  Preterm labor symptoms and general obstetric precautions including but not limited to vaginal bleeding, contractions, leaking of fluid and fetal movement were reviewed in detail with the patient.   Return in about 2 weeks (around 12/23/2019) for rob.  Nicole Hartman, CNM 12/09/2019 9:57 AM

## 2019-12-11 ENCOUNTER — Encounter (INDEPENDENT_AMBULATORY_CARE_PROVIDER_SITE_OTHER): Payer: Self-pay

## 2019-12-24 ENCOUNTER — Ambulatory Visit (INDEPENDENT_AMBULATORY_CARE_PROVIDER_SITE_OTHER): Payer: Medicaid Other | Admitting: Certified Nurse Midwife

## 2019-12-24 ENCOUNTER — Other Ambulatory Visit: Payer: Self-pay

## 2019-12-24 VITALS — BP 100/60 | Wt 148.0 lb

## 2019-12-24 DIAGNOSIS — Z23 Encounter for immunization: Secondary | ICD-10-CM

## 2019-12-24 DIAGNOSIS — Z3480 Encounter for supervision of other normal pregnancy, unspecified trimester: Secondary | ICD-10-CM

## 2019-12-24 DIAGNOSIS — O4700 False labor before 37 completed weeks of gestation, unspecified trimester: Secondary | ICD-10-CM

## 2019-12-24 DIAGNOSIS — O09893 Supervision of other high risk pregnancies, third trimester: Secondary | ICD-10-CM

## 2019-12-24 DIAGNOSIS — Z3A32 32 weeks gestation of pregnancy: Secondary | ICD-10-CM

## 2019-12-24 DIAGNOSIS — R109 Unspecified abdominal pain: Secondary | ICD-10-CM

## 2019-12-24 DIAGNOSIS — O26893 Other specified pregnancy related conditions, third trimester: Secondary | ICD-10-CM

## 2019-12-24 DIAGNOSIS — O479 False labor, unspecified: Secondary | ICD-10-CM

## 2019-12-24 DIAGNOSIS — O26899 Other specified pregnancy related conditions, unspecified trimester: Secondary | ICD-10-CM

## 2019-12-24 DIAGNOSIS — O403XX0 Polyhydramnios, third trimester, not applicable or unspecified: Secondary | ICD-10-CM

## 2019-12-24 LAB — POCT URINALYSIS DIPSTICK
Bilirubin, UA: NEGATIVE
Blood, UA: NEGATIVE
Glucose, UA: NEGATIVE
Protein, UA: POSITIVE — AB
Spec Grav, UA: 1.02 (ref 1.010–1.025)
Urobilinogen, UA: NEGATIVE E.U./dL — AB
pH, UA: 6 (ref 5.0–8.0)

## 2019-12-24 LAB — POCT URINALYSIS DIPSTICK OB: Glucose, UA: NEGATIVE

## 2019-12-24 NOTE — Progress Notes (Signed)
C/o a lot more cramping almost to the point of tears; lies down when gets home from work; gush of thick d/c.rj

## 2019-12-25 NOTE — Progress Notes (Signed)
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 Tries to stay hydrated. Drinks 60 oz water daily. Has also noticed a thick discharge on one occasion. No vulvar itching or irritation, No dysuria. Baby active. No vaginal bleeding. Has mild polyhydramnios.  Exam: FH 32 cm FHT 137 Pelvic exam: Vulva: no inflammation or lesions Vagina: off white discharge Cervix: 0.5cm/40%/-3 Wet prep: negative hyphae, Trich, clue cells Urine:  Results for orders placed or performed in visit on 12/24/19 (from the past 48 hour(s))  POC Urinalysis Dipstick OB     Status: Abnormal   Collection Time: 12/24/19  4:22 PM  Result Value Ref Range   Color, UA     Clarity, UA     Glucose, UA Negative Negative   Bilirubin, UA     Ketones, UA     Spec Grav, UA     Blood, UA     pH, UA     POC,PROTEIN,UA Trace Negative, Trace, Small (1+), Moderate (2+), Large (3+), 4+   Urobilinogen, UA     Nitrite, UA     Leukocytes, UA     Appearance     Odor    POCT urinalysis dipstick     Status: Abnormal   Collection Time: 12/24/19  4:23 PM  Result Value Ref Range   Color, UA     Clarity, UA     Glucose, UA Negative Negative   Bilirubin, UA NEG    Ketones, UA 1+    Spec Grav, UA 1.020 1.010 - 1.025   Blood, UA NEG    pH, UA 6.0 5.0 - 8.0   Protein, UA Positive (A) Negative    Comment: TRACE   Urobilinogen, UA negative (A) 0.2 or 1.0 E.U./dL   Nitrite, UA     Leukocytes, UA Trace (A) Negative   Appearance     Odor    FFN done  A: IUP at 32 wk2d with episodes of preterm contractions R/O UTI Mild polyhydramnios  P: FFN, urine culture Discussed getting maternity support garment. Also recommended limiting hours at work RTO in 2 weeks or sooner prn symptoms of preterm labor. Growth scan and AFI at next visit TDAP today BT consent  Farrel Conners, CNM

## 2019-12-28 ENCOUNTER — Encounter: Payer: Self-pay | Admitting: Certified Nurse Midwife

## 2019-12-28 LAB — FETAL FIBRONECTIN: Fetal Fibronectin: NEGATIVE

## 2019-12-29 ENCOUNTER — Telehealth: Payer: Self-pay

## 2019-12-29 ENCOUNTER — Other Ambulatory Visit: Payer: Self-pay

## 2019-12-29 ENCOUNTER — Inpatient Hospital Stay
Admission: EM | Admit: 2019-12-29 | Discharge: 2019-12-29 | Disposition: A | Discharge status: Home / Self Care | Payer: Medicaid Other | Attending: Certified Nurse Midwife | Admitting: Certified Nurse Midwife

## 2019-12-29 ENCOUNTER — Encounter: Payer: Self-pay | Admitting: Obstetrics and Gynecology

## 2019-12-29 DIAGNOSIS — Z3480 Encounter for supervision of other normal pregnancy, unspecified trimester: Secondary | ICD-10-CM

## 2019-12-29 DIAGNOSIS — O26893 Other specified pregnancy related conditions, third trimester: Secondary | ICD-10-CM

## 2019-12-29 DIAGNOSIS — R1030 Lower abdominal pain, unspecified: Secondary | ICD-10-CM | POA: Diagnosis not present

## 2019-12-29 DIAGNOSIS — R109 Unspecified abdominal pain: Secondary | ICD-10-CM

## 2019-12-29 DIAGNOSIS — Z3A33 33 weeks gestation of pregnancy: Secondary | ICD-10-CM | POA: Diagnosis not present

## 2019-12-29 DIAGNOSIS — O99013 Anemia complicating pregnancy, third trimester: Secondary | ICD-10-CM | POA: Insufficient documentation

## 2019-12-29 DIAGNOSIS — O403XX0 Polyhydramnios, third trimester, not applicable or unspecified: Secondary | ICD-10-CM | POA: Insufficient documentation

## 2019-12-29 DIAGNOSIS — O34219 Maternal care for unspecified type scar from previous cesarean delivery: Secondary | ICD-10-CM | POA: Diagnosis not present

## 2019-12-29 DIAGNOSIS — O99891 Other specified diseases and conditions complicating pregnancy: Secondary | ICD-10-CM | POA: Diagnosis not present

## 2019-12-29 DIAGNOSIS — D649 Anemia, unspecified: Secondary | ICD-10-CM | POA: Diagnosis not present

## 2019-12-29 DIAGNOSIS — M545 Low back pain: Secondary | ICD-10-CM

## 2019-12-29 LAB — CHLAMYDIA/NGC RT PCR (ARMC ONLY): N gonorrhoeae: NOT DETECTED

## 2019-12-29 LAB — URINE CULTURE

## 2019-12-29 LAB — CHLAMYDIA/NGC RT PCR (ARMC ONLY)??????????: Chlamydia Tr: NOT DETECTED

## 2019-12-29 NOTE — Telephone Encounter (Signed)
Pt called back; is having more than four ctxs an hour; pressure; feels like she needs to go to the bathroom but she can't.  Adv to go to L&D via ED.  Carmen notified.

## 2019-12-29 NOTE — Progress Notes (Signed)
Pt presents from home with c/o CTX since 0430 this morning. Reports CTX woke her up from her sleep. Reports lower abd cramping with CTX that is 8/10 and last about a minute. Reports about 4CTX/hr. Pt states she has constant lower back pressure that she rates 5/10. Pt took warm shower and has tried walking around. Reports drinking plenty of water. Prev c/s for arrest of descent no other concerns this pregnancy. VSS. Monitors applied.

## 2019-12-29 NOTE — Final Progress Note (Signed)
Physician Final Progress Note  Patient ID: Nicole Hartman MRN: [id number] DOB/AGE: 23/12/1995 23 y.o.  Admit date: 12/29/2019 Admitting provider: Natale Milch, MD/ Trinna Balloon, CNM Discharge date: 12/29/2019   Admission Diagnoses: Abdominal pain in pregnancy, third trimester IUP at 33 weeks  Discharge Diagnoses:  Active Problems:   Abdominal pain in pregnancy, third trimester IUP at 33 weeks  Consults: None  Significant Findings/ Diagnostic Studies:   HPI:  Nicole Hartman is a 24 y.o. G77P1001 female with EDC=02/16/2020 at [redacted]w[redacted]d dated by an 8 week ultrasound.  Her pregnancy has been complicated by a history of a previous Cesarean section, anemia, and mild polyhydramnios..  She presents to L&D for evaluation of lower abdominal pain and contractions that woke her up at 0430 this AM. She has a constant pressure type pain in her lower abdomen that radiates to her back which she rates as 5/10. The pain is intensified with contractions (8/10) She is having 4 contractions/hour. She was seen in the office on 3/12 for similar complaints and at that time her cervix was FT/40%/-3. FFN was negative as was her urine culture.  Prenatal care site: P[facility]atal care at [facility] has also been remarkable for  [facility]  Dating Ultrasound at [redacted]w[redacted]d Blood type: O/Positive/-- (09/18 1411)   Genetic Screen 1 Screen:    AFP:     Quad:     NIPS:diploid XY Antibody:Negative (09/18 1411)  Anatomic [place] Normal antomy, female gender, mild polyhydramnios Rubella: <0.90 (09/18 1411) Varicella: Immune  GTT Early:               Third trimester: 103 RPR: Non Reactive (09/18 1411)   Rhogam  HBsAg: Negative (09/18 1411)   TDaP vaccine    12/24/19                    Flu Shot: 07/08/2019 HIV: Non Reactive (09/18 1411)   Baby Food                                GBS:   Contraception  Pap:  CBB     CS/VBAC    Support Person             Maternal Medical History:   Past  Medical History:  Diagnosis Date  . Anemia   . Asthma   . Cesarean delivery delivered 03/02/2016  . Depression 2014  . PTSD (post-traumatic stress disorder) 2014    Past Surgical History:  Procedure Laterality Date  . CESAREAN SECTION N/A 03/01/2016   Procedure: CESAREAN SECTION;  Surgeon: Conard Novak, MD;  Location: [facility] ORS;  Service: Obstetrics;  Laterality: N/A;    No Known Allergies  Prior to Admission medications   Medication Sig Start Date End Date Taking? Authorizing Provider  albuterol (PROAIR HFA) 108 (90 BASE) MCG/ACT inhaler Inhale 1 puff into the lungs 3 (three) times daily as needed for wheezing (for cough).   Yes [provider]  fluticasone (FLONASE) 50 MCG/ACT nasal spray Place 2 sprays into both nostrils daily. 11/29/19  Yes Hawks, Edilia Bo, FNP  Prenatal Vit-Fe Fumarate-FA (PNV PRENATAL PLUS MULTIVITAMIN) 27-1 MG TABS Take by mouth.   Yes [provider]  famotidine (PEPCID) 20 MG tablet Take 20 mg by mouth daily.    [provider]  ferrous sulfate 325 (65 FE) MG tablet Take by mouth. 10/23/19 10/22/20  [provider]  Social History: She  reports that she has never smoked. She has never used smokeless tobacco. She reports that she does not drink alcohol or use drugs.  Family History: family history includes Breast cancer in her maternal aunt; Cancer in her maternal grandfather.   Review of Systems: Negative x 10 systems reviewed except as noted in the HPI.      Physical Exam:  Vital Signs: BP 116/71 (BP Location: Left Arm)   Pulse (!) 105   Temp 97.9 F (36.6 C) (Oral)   Resp 18   Ht 5' (1.524 m)   Wt 67 kg   LMP 05/17/2019 (Approximate)   BMI 28.85 kg/m  General: gravid female in no acute distress.  HEENT: normocephalic, atraumatic Heart: regular rate & rhythm.  No murmurs/rubs/gallops Lungs: clear to auscultation bilaterally Abdomen: soft, gravid, non-tender fundal area. Some tenderness in LUS. ;  Well  healed Cesarean section scar on lower abdomen Pelvic:   External: Normal external female genitalia  Cervix: Dilation: Fingertip / Effacement (%): 50 / Station: -3, Ballotable   Extremities: non-tender, symmetric, no edema bilaterally.    Neurologic: Alert & oriented x 3.   Baseline FHR: 130 baseline with accelerations to 160s to 170s, moderate variability, no decelerations Toco: some uterine irritability, only 2 contractions seen over 2 hours of monitoring    Bedside Ultrasound:  Cephalic presentation, +FM noted  Placenta anterior-above the area where patient is experiencing pain   Assessment:  Nicole Hartman is a 24 y.o. G14P1001 female at [redacted]w[redacted]d with lower abdominal pain  Patient reports that her pain resolved while resting in bed. Experienced some pain in lower abdomen and suprapubic area when bending over to put on underwear (may be related to polyhydramnios and cephalic presentation of fetus) No evidence of preterm labor No evidence of uterine rupture   Plan:  Discharge home with preterm  labor precautions Continue to wear maternity support garment Work excuse thru 12/31/19 Follow up next week at [facility] as scheduled    Procedures: bedside ultrasound  Discharge Condition: stable  Disposition: Discharge disposition: 01-Home or Self Care       Diet: Regular diet  Discharge Activity: Activity as tolerated  Discharge Instructions    Discharge patient   Complete by: As directed    Discharge disposition: 01-Home or Self Care   Discharge patient date: 12/29/2019     Allergies as of 12/29/2019   No Known Allergies     Medication List    TAKE these medications   famotidine 20 MG tablet Commonly known as: PEPCID Take 20 mg by mouth daily.   ferrous sulfate 325 (65 FE) MG tablet Take by mouth.   fluticasone 50 MCG/ACT nasal spray Commonly known as: FLONASE Place 2 sprays into both nostrils daily.   PNV Prenatal Plus Multivitamin 27-1 MG Tabs Take by  mouth.   ProAir HFA 108 (90 Base) MCG/ACT inhaler Generic drug: albuterol Inhale 1 puff into the lungs 3 (three) times daily as needed for wheezing (for cough).        Total time spent taking care of this patient: 20 minutes  Signed: Dalia Heading 12/29/2019, 4:31 PM

## 2019-12-29 NOTE — Discharge Instructions (Signed)
Please call labor and delivery at [phone] with signs of preterm labor including 6 or more contractions in 1 hour, vaginal bleeding, leakage of water from the vagina or decreased fetal movement.

## 2019-12-29 NOTE — Telephone Encounter (Signed)
Pt calling; was told to call if cramping increased; has been having severe cramping since 4;30 this am; no bleeding or d/c.  [phone]  [facility].

## 2020-01-07 ENCOUNTER — Ambulatory Visit (INDEPENDENT_AMBULATORY_CARE_PROVIDER_SITE_OTHER): Payer: Medicaid Other | Admitting: Certified Nurse Midwife

## 2020-01-07 ENCOUNTER — Ambulatory Visit (INDEPENDENT_AMBULATORY_CARE_PROVIDER_SITE_OTHER): Payer: Medicaid Other

## 2020-01-07 ENCOUNTER — Other Ambulatory Visit: Payer: Self-pay

## 2020-01-07 VITALS — BP 100/60 | Wt 151.0 lb

## 2020-01-07 DIAGNOSIS — Z3A34 34 weeks gestation of pregnancy: Secondary | ICD-10-CM | POA: Diagnosis not present

## 2020-01-07 DIAGNOSIS — Z3689 Encounter for other specified antenatal screening: Secondary | ICD-10-CM | POA: Diagnosis not present

## 2020-01-07 DIAGNOSIS — Z3480 Encounter for supervision of other normal pregnancy, unspecified trimester: Secondary | ICD-10-CM

## 2020-01-07 DIAGNOSIS — O403XX0 Polyhydramnios, third trimester, not applicable or unspecified: Secondary | ICD-10-CM

## 2020-01-07 DIAGNOSIS — O09893 Supervision of other high risk pregnancies, third trimester: Secondary | ICD-10-CM

## 2020-01-07 LAB — POCT URINALYSIS DIPSTICK OB
Glucose, UA: NEGATIVE
POC,PROTEIN,UA: NEGATIVE

## 2020-01-07 NOTE — Progress Notes (Signed)
C/o is feeling weird - thinks it's b/c she was laying back during u/s.

## 2020-01-07 NOTE — Progress Notes (Signed)
Nicole Hartman at 34wk6d: Doing better-not feeling as many contractions at work with wearing maternity support belt and working 1/2 days. Baby active.   Growth scan today with EFW 2536gm (5#9oz) and AFI 16.9cm/ cephalic presentation FH 36 cm  FHT 129 BPM  A: IUP at 34wk6d with normal AFI today and EFW in the 55th%  P: RTO 2 weeks for Nicole Hartman/ GBS/ APtima  Farrel Conners, CNM

## 2020-01-20 ENCOUNTER — Encounter: Payer: Self-pay | Admitting: Obstetrics and Gynecology

## 2020-01-20 ENCOUNTER — Encounter: Payer: Medicaid Other | Admitting: Advanced Practice Midwife

## 2020-01-20 ENCOUNTER — Ambulatory Visit (INDEPENDENT_AMBULATORY_CARE_PROVIDER_SITE_OTHER): Payer: Medicaid Other | Admitting: Obstetrics and Gynecology

## 2020-01-20 ENCOUNTER — Other Ambulatory Visit (HOSPITAL_COMMUNITY)
Admission: RE | Admit: 2020-01-20 | Discharge: 2020-01-20 | Disposition: A | Discharge status: Home / Self Care | Payer: Medicaid Other | Source: Ambulatory Visit | Attending: Obstetrics and Gynecology | Admitting: Obstetrics and Gynecology

## 2020-01-20 ENCOUNTER — Other Ambulatory Visit: Payer: Self-pay

## 2020-01-20 VITALS — BP 120/77 | Wt 155.0 lb

## 2020-01-20 DIAGNOSIS — Z3A36 36 weeks gestation of pregnancy: Secondary | ICD-10-CM | POA: Diagnosis not present

## 2020-01-20 DIAGNOSIS — O09893 Supervision of other high risk pregnancies, third trimester: Secondary | ICD-10-CM

## 2020-01-20 DIAGNOSIS — Z3483 Encounter for supervision of other normal pregnancy, third trimester: Secondary | ICD-10-CM | POA: Diagnosis not present

## 2020-01-20 DIAGNOSIS — O403XX0 Polyhydramnios, third trimester, not applicable or unspecified: Secondary | ICD-10-CM

## 2020-01-20 DIAGNOSIS — O34219 Maternal care for unspecified type scar from previous cesarean delivery: Secondary | ICD-10-CM

## 2020-01-20 LAB — POCT URINALYSIS DIPSTICK OB
Glucose, UA: NEGATIVE
POC,PROTEIN,UA: NEGATIVE

## 2020-01-20 NOTE — Progress Notes (Signed)
Routine Prenatal Care Visit  Subjective  Nicole Hartman is a 24 y.o. G2P1001 at [redacted]w[redacted]d being seen today for ongoing prenatal care.  She is currently monitored for the following issues for this low-risk pregnancy and has PTSD (post-traumatic stress disorder); MDD (major depressive disorder), recurrent episode, severe ([place]); Menorrhagia; Supervision of other normal pregnancy, antepartum; History of cesarean delivery, currently pregnant; Polyhydramnios affecting pregnancy in third trimester; and Abdominal pain in pregnancy, third trimester on their problem list.  ----------------------------------------------------------------------------------- Patient reports discomfort in general.  Occasionally feels like she'll pass out, but has not yet.   Contractions: Irregular. Vag. Bleeding: None.  Movement: Present. Leaking Fluid denies.  ----------------------------------------------------------------------------------- The following portions of the patient's history were reviewed and updated as appropriate: allergies, current medications, past family history, past medical history, past social history, past surgical history and problem list. Problem list updated.  Objective  Blood pressure 120/77, weight 155 lb (70.3 kg), last menstrual period 05/17/2019, unknown if currently breastfeeding. Pregravid weight 133 lb (60.3 kg) Total Weight Gain 22 lb (9.979 kg) Urinalysis: Urine Protein Negative  Urine Glucose Negative  Fetal Status: Fetal Heart Rate (bpm): 140 Fundal Height: 37 cm Movement: Present     General:  Alert, oriented and cooperative. Patient is in no acute distress.  Skin: Skin is warm and dry. No rash noted.   Cardiovascular: Normal heart rate noted  Respiratory: Normal respiratory effort, no problems with respiration noted  Abdomen: Soft, gravid, appropriate for gestational age. Pain/Pressure: Present     Pelvic:  Cervical exam performed Dilation: Fingertip Effacement (%): 40 Station: -3   Extremities: Normal range of motion.  Edema: None  Mental Status: Normal mood and affect. Normal behavior. Normal judgment and thought content.   Assessment   24 y.o. G2P1001 at [redacted]w[redacted]d by  02/16/2020, by Ultrasound presenting for routine prenatal visit  Plan   pregnancy2 Problems (from 05/17/19 to present)    Problem Noted Resolved   Polyhydramnios affecting pregnancy in third trimester 12/09/2019 by Rod Can, CNM No   Overview Signed 01/07/2020 12:15 PM by Dalia Heading, CNM    30 week AFI was 26.3cm 34 week AFI was normal at 16.9cm      Supervision of other normal pregnancy, antepartum 07/02/2019 by Rexene Agent, CNM No   Overview Addendum 01/07/2020 12:14 PM by Dalia Heading, [facility]  Dating Ultrasound at [redacted]w[redacted]d Blood type: O/Positive/-- (09/18 1411)   Genetic Screen 1 Screen:    AFP:     Quad:     NIPS:diploid XY Antibody:Negative (09/18 1411)  Anatomic [place] Normal except mildly elevated AFI Rubella: <0.90 (09/18 1411) Varicella: Immune  GTT Early:               Third trimester: 103 RPR: Non Reactive (09/18 1411)   Rhogam  HBsAg: Negative (09/18 1411)   TDaP vaccine    12/24/19                    Flu Shot: 07/08/2019 HIV: Non Reactive (09/18 1411)   Baby Food                                GBS:   Contraception  Pap:  CBB     CS/VBAC    Support Person              History of cesarean delivery, currently pregnant 07/02/2019 by Rexene Agent, CNM  No       Preterm labor symptoms and general obstetric precautions including but not limited to vaginal bleeding, contractions, leaking of fluid and fetal movement were reviewed in detail with the patient. Please refer to After Visit Summary for other counseling recommendations.   - DIscussed positional changes that can be made to improve circulation of blood back to the heart and brain. Continue to monitor - GBS/Aptima today  Return in about 1 week (around 01/27/2020) for Routine  Prenatal Appointment.  Thomasene Mohair, MD, [facility], [facility] 01/20/2020 3:23 PM

## 2020-01-22 LAB — STREP GP B NAA: Strep Gp B NAA: POSITIVE — AB

## 2020-01-24 LAB — CERVICOVAGINAL ANCILLARY ONLY
Chlamydia: NEGATIVE
Comment: NEGATIVE
Comment: NORMAL
Neisseria Gonorrhea: NEGATIVE

## 2020-01-29 ENCOUNTER — Encounter: Payer: Self-pay | Admitting: Obstetrics and Gynecology

## 2020-01-29 ENCOUNTER — Observation Stay
Admission: EM | Admit: 2020-01-29 | Discharge: 2020-01-29 | Disposition: A | Discharge status: Home / Self Care | Payer: Medicaid Other | Attending: Obstetrics and Gynecology | Admitting: Obstetrics and Gynecology

## 2020-01-29 ENCOUNTER — Other Ambulatory Visit: Payer: Self-pay

## 2020-01-29 DIAGNOSIS — R109 Unspecified abdominal pain: Secondary | ICD-10-CM | POA: Diagnosis not present

## 2020-01-29 DIAGNOSIS — O34219 Maternal care for unspecified type scar from previous cesarean delivery: Secondary | ICD-10-CM | POA: Insufficient documentation

## 2020-01-29 DIAGNOSIS — R102 Pelvic and perineal pain: Secondary | ICD-10-CM | POA: Insufficient documentation

## 2020-01-29 DIAGNOSIS — Z3A37 37 weeks gestation of pregnancy: Secondary | ICD-10-CM | POA: Diagnosis not present

## 2020-01-29 DIAGNOSIS — Z3480 Encounter for supervision of other normal pregnancy, unspecified trimester: Secondary | ICD-10-CM

## 2020-01-29 DIAGNOSIS — O26893 Other specified pregnancy related conditions, third trimester: Secondary | ICD-10-CM | POA: Diagnosis not present

## 2020-01-29 DIAGNOSIS — O403XX0 Polyhydramnios, third trimester, not applicable or unspecified: Secondary | ICD-10-CM

## 2020-01-29 DIAGNOSIS — O368130 Decreased fetal movements, third trimester, not applicable or unspecified: Principal | ICD-10-CM | POA: Insufficient documentation

## 2020-01-29 NOTE — H&P (Signed)
Chief Complaint: Abominable pain and decreased fetal movement  Prenatal Care Provider: [facility]  History of Present Illness: 24 y.o. G2P1001 [redacted]w[redacted]d by 02/16/2020, by Ultrasound presenting to L&D with 24-hr of decreased fetal movement as well as pain at the pubic bone.  Pain is constant rated 6/10 in severity.  It is exacerbated by movement.  Quality is described as sharp.  There is some radiation in the left groin and abdomen.  She does have a history of prior cesarean section.  Denies dysuria, hematuria.  She also has reported decrease in overall fetal movement although she still is feeling movement.  No LOF, no VB.     Pregravid weight 60.3 kg Total Weight Gain 9.979 kg  pregnancy2 Problems (from 05/17/19 to present)    Problem Noted Resolved   Polyhydramnios affecting pregnancy in third trimester 12/09/2019 by Rod Can, CNM No   Overview Signed 01/07/2020 12:15 PM by Dalia Heading, CNM    30 week AFI was 26.3cm 34 week AFI was normal at 16.9cm      Supervision of other normal pregnancy, antepartum 07/02/2019 by Rexene Agent, CNM No   Overview Addendum 01/07/2020 12:14 PM by Dalia Heading, [facility]  Dating Ultrasound at [redacted]w[redacted]d Blood type: O/Positive/-- (09/18 1411)   Genetic Screen 1 Screen:    AFP:     Quad:     NIPS:diploid XY Antibody:Negative (09/18 1411)  Anatomic [place] Normal except mildly elevated AFI Rubella: <0.90 (09/18 1411) Varicella: Immune  GTT Early:               Third trimester: 103 RPR: Non Reactive (09/18 1411)   Rhogam  HBsAg: Negative (09/18 1411)   TDaP vaccine    12/24/19                    Flu Shot: 07/08/2019 HIV: Non Reactive (09/18 1411)   Baby Food                                GBS:   Contraception  Pap:  CBB     CS/VBAC    Support Person              History of cesarean delivery, currently pregnant 07/02/2019 by Rexene Agent, CNM No       Review of Systems: 10 point review of systems negative unless otherwise  noted in HPI  Past Medical History: Patient Active Problem List   Diagnosis Date Noted  . Labor and delivery indication for care or intervention 01/29/2020  . Abdominal pain in pregnancy, third trimester 12/29/2019  . Polyhydramnios affecting pregnancy in third trimester 12/09/2019    30 week AFI was 26.3cm 34 week AFI was normal at 16.9cm   . Supervision of other normal pregnancy, antepartum 07/02/2019    [facility]  Dating Ultrasound at [redacted]w[redacted]d Blood type: O/Positive/-- (09/18 1411)   Genetic Screen 1 Screen:    AFP:     Quad:     NIPS:diploid XY Antibody:Negative (09/18 1411)  Anatomic [place] Normal except mildly elevated AFI Rubella: <0.90 (09/18 1411) Varicella: Immune  GTT Early:               Third trimester: 103 RPR: Non Reactive (09/18 1411)   Rhogam  HBsAg: Negative (09/18 1411)   TDaP vaccine    12/24/19  Flu Shot: 07/08/2019 HIV: Non Reactive (09/18 1411)   Baby Food                                GBS:   Contraception  Pap:  CBB     CS/VBAC    Support Person           . History of cesarean delivery, currently pregnant 07/02/2019  . Menorrhagia 04/11/2017    Due to nexplanon most likely. Plan to give estradiol 2mg  daily x 3 weeks, then have menstruation for a few months.  If periods stop occurring, can stop medication.  Otherwise, she may stay on the medication to control her bleeding.    PTSD (post-traumatic stress disorder) 01/08/2013  . MDD (major depressive disorder), recurrent episode, severe (HCC) 01/08/2013    Past Surgical History: Past Surgical History:  Procedure Laterality Date  . CESAREAN SECTION N/A 03/01/2016   Procedure: CESAREAN SECTION;  Surgeon: 03/03/2016, MD;  Location: [facility] ORS;  Service: Obstetrics;  Laterality: N/A;    Past Obstetric History: # 1 - Date: 03/01/16, Sex: Female, Weight: 3140 g, GA: [redacted]w[redacted]d, Delivery: C-Section, Low Vertical, Apgar1: 5, Apgar5: 9, Living: Living, Birth Comments: None  # 2 -  Date: None, Sex: None, Weight: None, GA: None, Delivery: None, Apgar1: None, Apgar5: None, Living: None, Birth Comments: None   Family History: Family History  Problem Relation Age of Onset  . Breast cancer Maternal Aunt   . Cancer Maternal Grandfather     Social History: Social History   Socioeconomic History  . Marital status: Married    Spouse name: Not on file  . Number of children: Not on file  . Years of education: Not on file  . Highest education level: Not on file  Occupational History  . Not on file  Tobacco Use  . Smoking status: Never Smoker  . Smokeless tobacco: Never Used  Substance and Sexual Activity  . Alcohol use: No  . Drug use: No  . Sexual activity: Yes    Birth control/protection: Pill  Other Topics Concern  . Not on file  Social History Narrative  . Not on file   Social Determinants of Health   Financial Resource Strain:   . Difficulty of Paying Living Expenses:   Food Insecurity:   . Worried About [redacted]w[redacted]d in the Last Year:   . Programme researcher, broadcasting/film/video in the Last Year:   Transportation Needs:   . Barista (Medical):   Freight forwarder Lack of Transportation (Non-Medical):   Physical Activity:   . Days of Exercise per Week:   . Minutes of Exercise per Session:   Stress:   . Feeling of Stress :   Social Connections:   . Frequency of Communication with Friends and Family:   . Frequency of Social Gatherings with Friends and Family:   . Attends Religious Services:   . Active Member of Clubs or Organizations:   . Attends [employer] Meetings:   Banker Marital Status:   Intimate Partner Violence:   . Fear of Current or Ex-Partner:   . Emotionally Abused:   [employer] [employer] Physically Abused:   . Sexually Abused:     Medications: Prior to Admission medications   Medication Sig Start Date End Date Taking? Authorizing Provider  albuterol (PROAIR HFA) 108 (90 BASE) MCG/ACT inhaler Inhale 1 puff into the lungs 3 (three) times daily as needed for  wheezing (for cough).   Yes [provider]  famotidine (PEPCID) 20 MG tablet Take 20 mg by mouth daily as needed for heartburn.    Yes [provider]  Prenatal Vit-Fe Fumarate-FA (PNV PRENATAL PLUS MULTIVITAMIN) 27-1 MG TABS Take 1 tablet by mouth daily.    Yes [provider]  fluticasone (FLONASE) 50 MCG/ACT nasal spray Place 2 sprays into both nostrils daily. Patient not taking: Reported on 01/29/2020 11/29/19   Jannifer Rodney A, FNP    Allergies: No Known Allergies  Physical Exam: Vitals: Blood pressure 132/76, pulse 72, temperature 97.9 F (36.6 C), temperature source Oral, resp. rate 16, height 5' (1.524 m), weight 70.3 kg, last menstrual period 05/17/2019, unknown if currently breastfeeding.  Urine Dip Protein: N/A  FHT: 145, moderate, +accels, no decels Toco: q5-12minutes  General: NAD HEENT: normocephalic, anicteric Pulmonary: No increased work of breathing Cardiovascular: RRR, distal pulses 2+ Abdomen: Gravid,there is reproducible pain on plapation of the pubic symphysis.   Genitourinary: deferred Extremities: no edema, erythema, or tenderness Neurologic: Grossly intact Psychiatric: mood appropriate, affect full  Labs: No results found for this or any previous visit (from the past 24 hour(s)).  Assessment: 24 y.o. G2P1001 [redacted]w[redacted]d by 02/16/2020, by Ultrasound presenting with abdominal pain and decrease fetal movement  Plan: 1) Abdominal pain - reproducible suprapubic pain.  Given the description of the pain most likely secondary to diastasis pubis.  However, stretching of scar tissue may also be a component of her the sharp pain she had noted vs round ligament pain.   - discussed supportive options including tylenol, localized heat, pregnancy support belts  2) Fetus/decreased fetal movement - reactive category I NST  3) PNL - Blood type O/Positive/-- (09/18 1411) / Anti-bodyscreen Negative (09/18 1411) / Rubella <0.90 (09/18 1411) / Varicella  Immune / RPR Non Reactive (01/29 1010) / HBsAg Negative (09/18 1411) / HIV Non Reactive (01/29 1010) / 1-hr OGTT 109 / GBS Positive/-- (04/08 1528)  4) Immunization History -  Immunization History  Administered Date(s) Administered  . Influenza,inj,Quad PF,6+ Mos 07/08/2019  . Tdap 12/24/2019    5) Disposition -  Home follow up 01/31/2020  Vena Austria, MD, [facility], [facility] 01/29/2020, 10:32 PM

## 2020-01-29 NOTE — Discharge Summary (Signed)
Physician Final Progress Note  Patient ID: Nicole Hartman MRN: [id number] DOB/AGE: January 29, 1996 24 y.o.  Admit date: 01/29/2020 Admitting provider: Vena Austria, MD Discharge date: 01/29/2020   Admission Diagnoses:  1) Abdominal pain in pregnancy 2) Decreased fetal movement  Discharge Diagnoses:  Active Problems:   Labor and delivery indication for care or intervention   Consults: None  Significant Findings/ Diagnostic Studies: none  Procedures: NST  Discharge Condition: good  Disposition: Discharge disposition: 01-Home or Self Care       Diet: Regular diet  Discharge Activity: Activity as tolerated  Discharge Instructions    Discharge activity:  No Restrictions   Complete by: As directed    Discharge diet:  No restrictions   Complete by: As directed    Fetal Kick Count:  Lie on our left side for one hour after a meal, and count the number of times your baby kicks.  If it is less than 5 times, get up, move around and drink some juice.  Repeat the test 30 minutes later.  If it is still less than 5 kicks in an hour, notify your doctor.   Complete by: As directed    LABOR:  When conractions begin, you should start to time them from the beginning of one contraction to the beginning  of the next.  When contractions are 5 - 10 minutes apart or less and have been regular for at least an hour, you should call your health care provider.   Complete by: As directed    No sexual activity restrictions   Complete by: As directed    Notify physician for bleeding from the vagina   Complete by: As directed    Notify physician for blurring of vision or spots before the eyes   Complete by: As directed    Notify physician for chills or fever   Complete by: As directed    Notify physician for fainting spells, "black outs" or loss of consciousness   Complete by: As directed    Notify physician for increase in vaginal discharge   Complete by: As directed    Notify physician for  leaking of fluid   Complete by: As directed    Notify physician for pain or burning when urinating   Complete by: As directed    Notify physician for pelvic pressure (sudden increase)   Complete by: As directed    Notify physician for severe or continued nausea or vomiting   Complete by: As directed    Notify physician for sudden gushing of fluid from the vagina (with or without continued leaking)   Complete by: As directed    Notify physician for sudden, constant, or occasional abdominal pain   Complete by: As directed    Notify physician if baby moving less than usual   Complete by: As directed      Allergies as of 01/29/2020   No Known Allergies     Medication List    TAKE these medications   famotidine 20 MG tablet Commonly known as: PEPCID Take 20 mg by mouth daily as needed for heartburn.   fluticasone 50 MCG/ACT nasal spray Commonly known as: FLONASE Place 2 sprays into both nostrils daily.   PNV Prenatal Plus Multivitamin 27-1 MG Tabs Take 1 tablet by mouth daily.   ProAir HFA 108 (90 Base) MCG/ACT inhaler Generic drug: albuterol Inhale 1 puff into the lungs 3 (three) times daily as needed for wheezing (for cough).        Total  time spent taking care of this patient: 30 minutes  Signed: Malachy Mood 01/29/2020, 10:31 PM

## 2020-01-29 NOTE — OB Triage Note (Signed)
Pt is a G2P1 and [redacted]w[redacted]d sent to L&D via nurse line for inconsistent ctx and abdominal pain. Pt states pain is 8/10 on a 0-10 pain scale and pointed to her lower abdomen went asked where pain location is. Pt states lower abdomen is "slightly uncomfortable" when pressed on. Pt states baby has had good fetal movement but "not as frequent" today. Pt denies VB or LOF. VSS. Monitors applied and assessing. Initial FHT 135.

## 2020-01-29 NOTE — OB Triage Note (Signed)
Discharge instructions reviewed with patient. Pt verbalized understanding and denies any further needs at this time. Pt stable at discharge.

## 2020-01-31 ENCOUNTER — Other Ambulatory Visit: Payer: Self-pay

## 2020-01-31 ENCOUNTER — Encounter: Payer: Self-pay | Admitting: Obstetrics and Gynecology

## 2020-01-31 ENCOUNTER — Ambulatory Visit (INDEPENDENT_AMBULATORY_CARE_PROVIDER_SITE_OTHER): Payer: Medicaid Other | Admitting: Obstetrics and Gynecology

## 2020-01-31 VITALS — BP 118/76

## 2020-01-31 DIAGNOSIS — O34219 Maternal care for unspecified type scar from previous cesarean delivery: Secondary | ICD-10-CM

## 2020-01-31 DIAGNOSIS — Z3483 Encounter for supervision of other normal pregnancy, third trimester: Secondary | ICD-10-CM

## 2020-01-31 DIAGNOSIS — O403XX0 Polyhydramnios, third trimester, not applicable or unspecified: Secondary | ICD-10-CM

## 2020-01-31 DIAGNOSIS — Z3A37 37 weeks gestation of pregnancy: Secondary | ICD-10-CM

## 2020-01-31 NOTE — Progress Notes (Signed)
OB History & Physical   History of Present Illness:  Chief Complaint: pre-operative visit for c-section on 4/30   HPI:  Nicole Hartman is a 24 y.o. G2P1001 female at [redacted]w[redacted]d dated by 8 week ultrasound.  Her pregnancy has been complicated by history of c-section, mild polyhydramnios (resolved).    She reports contractions, cramping.   She denies leakage of fluid.   She denies vaginal bleeding.   She reports fetal movement.    Total weight gain for pregnancy: 22 lb (9.979 kg)   Obstetrical Problem List: pregnancy2 Problems (from 05/17/19 to present)    Problem Noted Resolved   Polyhydramnios affecting pregnancy in third trimester 12/09/2019 by Gledhill, Jane, CNM No   Overview Signed 01/07/2020 12:15 PM by Gutierrez, Colleen, CNM    30 week AFI was 26.3cm 34 week AFI was normal at 16.9cm      Supervision of other normal pregnancy, antepartum 07/02/2019 by Schmid, Jacelyn Y, CNM No   Overview Addendum 01/07/2020 12:14 PM by Gutierrez, Colleen, CNM    Clinic [facility]  Dating Ultrasound at [redacted]w[redacted]d Blood type: O/Positive/-- (09/18 1411)   Genetic Screen 1 Screen:    AFP:     Quad:     NIPS:diploid XY Antibody:Negative (09/18 1411)  Anatomic US Normal except mildly elevated AFI Rubella: <0.90 (09/18 1411) Varicella: Immune  GTT Early:               Third trimester: 103 RPR: Non Reactive (09/18 1411)   Rhogam  HBsAg: Negative (09/18 1411)   TDaP vaccine    12/24/19                    Flu Shot: 07/08/2019 HIV: Non Reactive (09/18 1411)   Baby Food                                GBS:   Contraception  Pap:  CBB     CS/VBAC    Support Person              History of cesarean delivery, currently pregnant 07/02/2019 by Schmid, Jacelyn Y, CNM No       Maternal Medical History:   Past Medical History:  Diagnosis Date  . Anemia   . Asthma   . Cesarean delivery delivered 03/02/2016  . Depression 2014  . PTSD (post-traumatic stress disorder) 2014    Past Surgical History:   Procedure Laterality Date  . CESAREAN SECTION N/A 03/01/2016   Procedure: CESAREAN SECTION;  Surgeon: Saulo Anthis D Emmelyn Schmale, MD;  Location: [facility] ORS;  Service: Obstetrics;  Laterality: N/A;    No Known Allergies  Prior to Admission medications   Medication Sig Start Date End Date Taking? Authorizing Provider  albuterol (PROAIR HFA) 108 (90 BASE) MCG/ACT inhaler Inhale 1 puff into the lungs 3 (three) times daily as needed for wheezing (for cough).    [provider]  famotidine (PEPCID) 20 MG tablet Take 20 mg by mouth daily as needed for heartburn.     [provider]  fluticasone (FLONASE) 50 MCG/ACT nasal spray Place 2 sprays into both nostrils daily. Patient not taking: Reported on 01/29/2020 11/29/19   Hawks, Christy A, FNP  Prenatal Vit-Fe Fumarate-FA (PNV PRENATAL PLUS MULTIVITAMIN) 27-1 MG TABS Take 1 tablet by mouth daily.     [provider]    OB History  Gravida Para Term Preterm AB Living  2 1 1       1  SAB TAB Ectopic Multiple Live Births        0 1    # Outcome Date GA Lbr Len/2nd Weight Sex Delivery Anes PTL Lv  2 Current           1 Term 03/01/16 [redacted]w[redacted]d / 04:35 6 lb 14.8 oz (3.14 kg) M CS-LVertical Spinal  LIV     Complications: Failure to Progress in Second Stage    Prenatal care site: [facility]  Social History: She  reports that she has never smoked. She has never used smokeless tobacco. She reports that she does not drink alcohol or use drugs.  Family History: family history includes Breast cancer in her maternal aunt; Cancer in her maternal grandfather.   Review of Systems:  Review of Systems  Constitutional: Negative.   HENT: Negative.   Eyes: Negative.   Respiratory: Negative.   Cardiovascular: Negative.   Gastrointestinal: Negative.   Genitourinary: Negative.   Musculoskeletal: Negative.   Skin: Negative.   Neurological: Negative.   Psychiatric/Behavioral: Negative.      Physical Exam:  BP 118/76   LMP 05/17/2019  (Approximate)   Physical Exam Constitutional:      General: She is not in acute distress.    Appearance: Normal appearance. She is well-developed.  Genitourinary:     Genitourinary Comments: SVE: 2/50/-2  HENT:     Head: Normocephalic and atraumatic.  Eyes:     General: No scleral icterus.    Conjunctiva/sclera: Conjunctivae normal.  Cardiovascular:     Rate and Rhythm: Normal rate and regular rhythm.     Heart sounds: No murmur. No friction rub. No gallop.   Pulmonary:     Effort: Pulmonary effort is normal. No respiratory distress.     Breath sounds: Normal breath sounds. No wheezing or rales.  Abdominal:     General: Bowel sounds are normal. There is no distension.     Palpations: Abdomen is soft.     Tenderness: There is no abdominal tenderness. There is no guarding or rebound.     Comments: Gravid, NT  Musculoskeletal:        General: Normal range of motion.     Cervical back: Normal range of motion and neck supple.  Neurological:     General: No focal deficit present.     Mental Status: She is alert and oriented to person, place, and time.     Cranial Nerves: No cranial nerve deficit.  Skin:    General: Skin is warm and dry.     Findings: No erythema.  Psychiatric:        Mood and Affect: Mood normal.        Behavior: Behavior normal.        Judgment: Judgment normal.   FHR: 125 bpm   Female chaperone present for pelvic exam:   No results found for: SARSCOV2NAA  Assessment:  Nicole Hartman is a 24 y.o. G53P1001 female at [redacted]w[redacted]d with history of cesarean delivery, desires repeat.   Plan:  1. Admit to Labor & Delivery  2. CBC, T&S, NPO, IVF 3. GBS positive.   4. Fetwal well-being: reassuring 5. Consents signed.  Reviewed surgery with patient in detail.  Labor precautions reviewed.   Thomasene Mohair, MD 01/31/2020 9:07 AM

## 2020-01-31 NOTE — H&P (View-Only) (Signed)
OB History & Physical   History of Present Illness:  Chief Complaint: pre-operative visit for c-section on 4/30   HPI:  Nicole Hartman is a 24 y.o. G2P1001 female at [redacted]w[redacted]d dated by 8 week ultrasound.  Her pregnancy has been complicated by history of c-section, mild polyhydramnios (resolved).    She reports contractions, cramping.   She denies leakage of fluid.   She denies vaginal bleeding.   She reports fetal movement.    Total weight gain for pregnancy: 22 lb (9.979 kg)   Obstetrical Problem List: pregnancy2 Problems (from 05/17/19 to present)    Problem Noted Resolved   Polyhydramnios affecting pregnancy in third trimester 12/09/2019 by Rod Can, CNM No   Overview Signed 01/07/2020 12:15 PM by Dalia Heading, CNM    30 week AFI was 26.3cm 34 week AFI was normal at 16.9cm      Supervision of other normal pregnancy, antepartum 07/02/2019 by Rexene Agent, CNM No   Overview Addendum 01/07/2020 12:14 PM by Dalia Heading, [place] [facility]  Dating Ultrasound at [redacted]w[redacted]d Blood type: O/Positive/-- (09/18 1411)   Genetic Screen 1 Screen:    AFP:     Quad:     NIPS:diploid XY Antibody:Negative (09/18 1411)  Anatomic [place] Normal except mildly elevated AFI Rubella: <0.90 (09/18 1411) Varicella: Immune  GTT Early:               Third trimester: 103 RPR: Non Reactive (09/18 1411)   Rhogam  HBsAg: Negative (09/18 1411)   TDaP vaccine    12/24/19                    Flu Shot: 07/08/2019 HIV: Non Reactive (09/18 1411)   Baby Food                                GBS:   Contraception  Pap:  CBB     CS/VBAC    Support Person              History of cesarean delivery, currently pregnant 07/02/2019 by Rexene Agent, CNM No       Maternal Medical History:   Past Medical History:  Diagnosis Date  . Anemia   . Asthma   . Cesarean delivery delivered 03/02/2016  . Depression 2014  . PTSD (post-traumatic stress disorder) 2014    Past Surgical History:   Procedure Laterality Date  . CESAREAN SECTION N/A 03/01/2016   Procedure: CESAREAN SECTION;  Surgeon: Will Bonnet, MD;  Location: [facility] ORS;  Service: Obstetrics;  Laterality: N/A;    No Known Allergies  Prior to Admission medications   Medication Sig Start Date End Date Taking? Authorizing Provider  albuterol (PROAIR HFA) 108 (90 BASE) MCG/ACT inhaler Inhale 1 puff into the lungs 3 (three) times daily as needed for wheezing (for cough).    [provider]  famotidine (PEPCID) 20 MG tablet Take 20 mg by mouth daily as needed for heartburn.     [provider]  fluticasone (FLONASE) 50 MCG/ACT nasal spray Place 2 sprays into both nostrils daily. Patient not taking: Reported on 01/29/2020 11/29/19   Sharion Balloon, FNP  Prenatal Vit-Fe Fumarate-FA (PNV PRENATAL PLUS MULTIVITAMIN) 27-1 MG TABS Take 1 tablet by mouth daily.     [provider]    OB History  Gravida Para Term Preterm AB Living  2 1 1  1  SAB TAB Ectopic Multiple Live Births        0 1    # Outcome Date GA Lbr Len/2nd Weight Sex Delivery Anes PTL Lv  2 Current           1 Term 03/01/16 [redacted]w[redacted]d / 04:35 6 lb 14.8 oz (3.14 kg) M CS-LVertical Spinal  LIV     Complications: Failure to Progress in Second Stage    Prenatal care site: [facility]  Social History: She  reports that she has never smoked. She has never used smokeless tobacco. She reports that she does not drink alcohol or use drugs.  Family History: family history includes Breast cancer in her maternal aunt; Cancer in her maternal grandfather.   Review of Systems:  Review of Systems  Constitutional: Negative.   HENT: Negative.   Eyes: Negative.   Respiratory: Negative.   Cardiovascular: Negative.   Gastrointestinal: Negative.   Genitourinary: Negative.   Musculoskeletal: Negative.   Skin: Negative.   Neurological: Negative.   Psychiatric/Behavioral: Negative.      Physical Exam:  BP 118/76   LMP 05/17/2019  (Approximate)   Physical Exam Constitutional:      General: She is not in acute distress.    Appearance: Normal appearance. She is well-developed.  Genitourinary:     Genitourinary Comments: SVE: 2/50/-2  HENT:     Head: Normocephalic and atraumatic.  Eyes:     General: No scleral icterus.    Conjunctiva/sclera: Conjunctivae normal.  Cardiovascular:     Rate and Rhythm: Normal rate and regular rhythm.     Heart sounds: No murmur. No friction rub. No gallop.   Pulmonary:     Effort: Pulmonary effort is normal. No respiratory distress.     Breath sounds: Normal breath sounds. No wheezing or rales.  Abdominal:     General: Bowel sounds are normal. There is no distension.     Palpations: Abdomen is soft.     Tenderness: There is no abdominal tenderness. There is no guarding or rebound.     Comments: Gravid, NT  Musculoskeletal:        General: Normal range of motion.     Cervical back: Normal range of motion and neck supple.  Neurological:     General: No focal deficit present.     Mental Status: She is alert and oriented to person, place, and time.     Cranial Nerves: No cranial nerve deficit.  Skin:    General: Skin is warm and dry.     Findings: No erythema.  Psychiatric:        Mood and Affect: Mood normal.        Behavior: Behavior normal.        Judgment: Judgment normal.   FHR: 125 bpm   Female chaperone present for pelvic exam:   No results found for: SARSCOV2NAA  Assessment:  Nicole Hartman is a 24 y.o. G53P1001 female at [redacted]w[redacted]d with history of cesarean delivery, desires repeat.   Plan:  1. Admit to Labor & Delivery  2. CBC, T&S, NPO, IVF 3. GBS positive.   4. Fetwal well-being: reassuring 5. Consents signed.  Reviewed surgery with patient in detail.  Labor precautions reviewed.   Thomasene Mohair, MD 01/31/2020 9:07 AM

## 2020-02-07 ENCOUNTER — Encounter: Payer: Self-pay | Admitting: Obstetrics and Gynecology

## 2020-02-07 ENCOUNTER — Encounter
Admission: RE | Admit: 2020-02-07 | Discharge: 2020-02-07 | Disposition: A | Discharge status: Home / Self Care | Payer: Medicaid Other | Source: Ambulatory Visit | Attending: Obstetrics and Gynecology | Admitting: Obstetrics and Gynecology

## 2020-02-07 ENCOUNTER — Ambulatory Visit (INDEPENDENT_AMBULATORY_CARE_PROVIDER_SITE_OTHER): Payer: Medicaid Other | Admitting: Obstetrics and Gynecology

## 2020-02-07 ENCOUNTER — Other Ambulatory Visit: Payer: Self-pay

## 2020-02-07 VITALS — BP 120/80 | Wt 159.0 lb

## 2020-02-07 DIAGNOSIS — O34219 Maternal care for unspecified type scar from previous cesarean delivery: Secondary | ICD-10-CM

## 2020-02-07 DIAGNOSIS — Z3A38 38 weeks gestation of pregnancy: Secondary | ICD-10-CM

## 2020-02-07 DIAGNOSIS — Z01812 Encounter for preprocedural laboratory examination: Secondary | ICD-10-CM | POA: Diagnosis not present

## 2020-02-07 DIAGNOSIS — Z3483 Encounter for supervision of other normal pregnancy, third trimester: Secondary | ICD-10-CM

## 2020-02-07 HISTORY — DX: Gastro-esophageal reflux disease without esophagitis: K21.9

## 2020-02-07 HISTORY — DX: Headache, unspecified: R51.9

## 2020-02-07 LAB — POCT URINALYSIS DIPSTICK OB
Glucose, UA: NEGATIVE
POC,PROTEIN,UA: NEGATIVE

## 2020-02-07 NOTE — Progress Notes (Addendum)
Routine Prenatal Care Visit  Subjective  Nicole Hartman is a 24 y.o. G2P1001 at [redacted]w[redacted]d being seen today for ongoing prenatal care.  She is currently monitored for the following issues for this low-risk pregnancy and has PTSD (post-traumatic stress disorder); MDD (major depressive disorder), recurrent episode, severe (HCC); Menorrhagia; Supervision of other normal pregnancy, antepartum; History of cesarean delivery, currently pregnant; Polyhydramnios affecting pregnancy in third trimester; Abdominal pain during pregnancy in third trimester; Labor and delivery indication for care or intervention; and Decreased fetus movements affecting management of mother in third trimester on their problem list.  ----------------------------------------------------------------------------------- Patient reports no complaints.   Contractions: Irregular. Vag. Bleeding: None.  Movement: Present. Denies leaking of fluid.  ----------------------------------------------------------------------------------- The following portions of the patient's history were reviewed and updated as appropriate: allergies, current medications, past family history, past medical history, past social history, past surgical history and problem list. Problem list updated.   Objective  Blood pressure 120/80, weight 159 lb (72.1 kg), last menstrual period 05/17/2019, unknown if currently breastfeeding. Pregravid weight 133 lb (60.3 kg) Total Weight Gain 26 lb (11.8 kg) Urinalysis:      Fetal Status: Fetal Heart Rate (bpm): 125   Movement: Present     General:  Alert, oriented and cooperative. Patient is in no acute distress.  Skin: Skin is warm and dry. No rash noted.   Cardiovascular: Normal heart rate noted  Respiratory: Normal respiratory effort, no problems with respiration noted  Abdomen: Soft, gravid, appropriate for gestational age. Pain/Pressure: Present     Pelvic:  Cervical exam deferred        Extremities: Normal range  of motion.     Mental Status: Normal mood and affect. Normal behavior. Normal judgment and thought content.     Assessment   24 y.o. G2P1001 at [redacted]w[redacted]d by  02/16/2020, by Ultrasound presenting for routine prenatal visit  Plan   pregnancy2 Problems (from 05/17/19 to present)    Problem Noted Resolved   Polyhydramnios affecting pregnancy in third trimester 12/09/2019 by [name], CNM No   Overview Signed 01/07/2020 12:15 PM by Farrel Conners, CNM    30 week AFI was 26.3cm 34 week AFI was normal at 16.9cm      Supervision of other normal pregnancy, antepartum 07/02/2019 by Oswaldo Conroy, CNM No   Overview Addendum 01/07/2020 12:14 PM by Farrel Conners, CNM    Clinic [facility]  Dating Ultrasound at [redacted]w[redacted]d Blood type: O/Positive/-- (09/18 1411)   Genetic Screen 1 Screen:    AFP:     Quad:     NIPS:diploid XY Antibody:Negative (09/18 1411)  Anatomic [place] Normal except mildly elevated AFI Rubella: <0.90 (09/18 1411) Varicella: Immune  GTT Early:               Third trimester: 103 RPR: Non Reactive (09/18 1411)   Rhogam  HBsAg: Negative (09/18 1411)   TDaP vaccine    12/24/19                    Flu Shot: 07/08/2019 HIV: Non Reactive (09/18 1411)   Baby Food                                GBS:   Contraception  Pap:  CBB     CS/VBAC    Support Person              History of cesarean delivery,  currently pregnant 07/02/2019 by Rexene Agent, CNM No       Gestational age appropriate obstetric precautions including but not limited to vaginal bleeding, contractions, leaking of fluid and fetal movement were reviewed in detail with the patient.    Return if symptoms worsen or fail to improve.  Homero Fellers MD [facility], [facility] 02/07/2020, 4:19 PM

## 2020-02-07 NOTE — Patient Instructions (Signed)
Your procedure is scheduled on: 02-11-20 FRIDAY Report to [facility] PROCEED TO LABOR AND DELIVERY ON 3RD FLOOR-ARRIVE AT 10:15 AM  Remember: Instructions that are not followed completely may result in serious medical risk, up to and including death, or upon the discretion of your surgeon and anesthesiologist your surgery may need to be rescheduled.    _x___ 1. Do not eat food after midnight the night before your procedure. NO GUM OR CANDY AFTER MIDNIGHT. You may drink clear liquids up to 2 hours before you are scheduled to arrive at the hospital for your procedure.  Do not drink clear liquids within 2 hours of your scheduled arrival to the hospital.  Clear liquids include  --Water or Apple juice without pulp  --Gatorade  --Black Coffee or Clear Tea (No milk, no creamers, do not add anything to the coffee or Tea-sugar is ok to add)   ____Ensure clear carbohydrate drink on the way to the hospital for bariatric patients  ____Ensure clear carbohydrate drink 3 hours before surgery.    __x__ 2. No Alcohol for 24 hours before or after surgery.   __x__3. No Smoking or e-cigarettes for 24 prior to surgery.  Do not use any chewable tobacco products for at least 6 hour prior to surgery   ____  4. Bring all medications with you on the day of surgery if instructed.    __x__ 5. Notify your doctor if there is any change in your medical condition     (cold, fever, infections).    x___6. On the morning of surgery brush your teeth with toothpaste and water.  You may rinse your mouth with mouth wash if you wish.  Do not swallow any toothpaste or mouthwash.   Do not wear jewelry, make-up, hairpins, clips or nail polish.  Do not wear lotions, powders, or perfumes.   Do not shave 48 hours prior to surgery. Men may shave face and neck.  Do not bring valuables to the hospital.    [facility] is not responsible for any belongings or valuables.               Contacts, dentures or bridgework  may not be worn into surgery.  Leave your suitcase in the car. After surgery it may be brought to your room.  For patients admitted to the hospital, discharge time is determined by your treatment team.  _  Patients discharged the day of surgery will not be allowed to drive home.  You will need someone to drive you home and stay with you the night of your procedure.    Please read over the following fact sheets that you were given:   [facility] Preparing for Surgery   _x___ TAKE THE FOLLOWING MEDICATION THE MORNING OF SURGERY WITH A SMALL SIP OF WATER. These include:  1. PEPCID (FAMOTIDINE)  2. TAKE A PEPCID THE NIGHT BEFORE YOUR SURGERY  3.  4.  5.  6.  ____Fleets enema or Magnesium Citrate as directed.   _x___ Use CHG Soap or sage wipes as directed on instruction sheet   _X___ Use inhalers on the day of surgery and bring to hospital day of surgery-USE YOUR ALBUTEROL INHALER AM OF SURGERY AND BRING INHALER TO HOSPITAL  ____ Stop Metformin and Janumet 2 days prior to surgery.    ____ Take 1/2 of usual insulin dose the night before surgery and none on the morning surgery.   ____ Follow recommendations from Cardiologist, Pulmonologist or PCP regarding stopping Aspirin, Coumadin,  Plavix ,Eliquis, Effient, or Pradaxa, and Pletal.  X____Stop Anti-inflammatories such as Advil, Aleve, Ibuprofen, Motrin, Naproxen, Naprosyn, Goodies powders or aspirin products. OK to take Tylenol    ____ Stop supplements until after surgery.     ____ Bring C-Pap to the hospital.

## 2020-02-07 NOTE — Addendum Note (Signed)
Addended by: Cornelius Moras D on: 02/07/2020 04:21 PM   Modules accepted: Orders

## 2020-02-07 NOTE — Patient Instructions (Addendum)
Cesarean Delivery Cesarean birth, or cesarean delivery, is the surgical delivery of a baby through an incision in the abdomen and the uterus. This may be referred to as a C-section. This procedure may be scheduled ahead of time, or it may be done in an emergency situation. Tell a health care provider about:  Any allergies you have.  All medicines you are taking, including vitamins, herbs, eye drops, creams, and over-the-counter medicines.  Any problems you or family members have had with anesthetic medicines.  Any blood disorders you have.  Any surgeries you have had.  Any medical conditions you have.  Whether you or any members of your family have a history of deep vein thrombosis (DVT) or pulmonary embolism (PE). What are the risks? Generally, this is a safe procedure. However, problems may occur, including:  Infection.  Bleeding.  Allergic reactions to medicines.  Damage to other structures or organs.  Blood clots.  Injury to your baby. What happens before the procedure? General instructions  Follow instructions from your health care provider about eating or drinking restrictions.  If you know that you are going to have a cesarean delivery, do not shave your pubic area. Shaving before the procedure may increase your risk of infection.  Plan to have someone take you home from the hospital.  Ask your health care provider what steps will be taken to prevent infection. These may include: ? Removing hair at the surgery site. ? Washing skin with a germ-killing soap. ? Taking antibiotic medicine.  Depending on the reason for your cesarean delivery, you may have a physical exam or additional testing, such as an ultrasound.  You may have your blood or urine tested. Questions for your health care provider  Ask your health care provider about: ? Changing or stopping your regular medicines. This is especially important if you are taking diabetes medicines or blood  thinners. ? Your pain management plan. This is especially important if you plan to breastfeed your baby. ? How long you will be in the hospital after the procedure. ? Any concerns you may have about receiving blood products, if you need them during the procedure. ? Cord blood banking, if you plan to collect your baby's umbilical cord blood.  You may also want to ask your health care provider: ? Whether you will be able to hold or breastfeed your baby while you are still in the operating room. ? Whether your baby can stay with you immediately after the procedure and during your recovery. ? Whether a family member or a person of your choice can go with you into the operating room and stay with you during the procedure, immediately after the procedure, and during your recovery. What happens during the procedure?   An IV will be inserted into one of your veins.  Fluid and medicines, such as antibiotics, will be given before the surgery.  Fetal monitors will be placed on your abdomen to check your baby's heart rate.  You may be given a special warming gown to wear to keep your temperature stable.  A catheter may be inserted into your bladder through your urethra. This drains your urine during the procedure.  You may be given one or more of the following: ? A medicine to numb the area (local anesthetic). ? A medicine to make you fall asleep (general anesthetic). ? A medicine (regional anesthetic) that is injected into your back or through a small thin tube placed in your back (spinal anesthetic or epidural anesthetic).   This numbs everything below the injection site and allows you to stay awake during your procedure. If this makes you feel nauseous, tell your health care provider. Medicines will be available to help reduce any nausea you may feel.  An incision will be made in your abdomen, and then in your uterus.  If you are awake during your procedure, you may feel tugging and pulling in  your abdomen, but you should not feel pain. If you feel pain, tell your health care provider immediately.  Your baby will be removed from your uterus. You may feel more pressure or pushing while this happens.  Immediately after birth, your baby will be dried and kept warm. You may be able to hold and breastfeed your baby.  The umbilical cord may be clamped and cut during this time. This usually occurs after waiting a period of 1-2 minutes after delivery.  Your placenta will be removed from your uterus.  Your incisions will be closed with stitches (sutures). Staples, skin glue, or adhesive strips may also be applied to the incision in your abdomen.  Bandages (dressings) may be placed over the incision in your abdomen. The procedure may vary among health care providers and hospitals. What happens after the procedure?  Your blood pressure, heart rate, breathing rate, and blood oxygen level will be monitored until you are discharged from the hospital.  You may continue to receive fluids and medicines through an IV.  You will have some pain. Medicines will be available to help control your pain.  To help prevent blood clots: ? You may be given medicines. ? You may have to wear compression stockings or devices. ? You will be encouraged to walk around when you are able.  Hospital staff will encourage and support bonding with your baby. Your hospital may have you and your baby to stay in the same room (rooming in) during your hospital stay to encourage successful bonding and breastfeeding.  You may be encouraged to cough and breathe deeply often. This helps to prevent lung problems.  If you have a catheter draining your urine, it will be removed as soon as possible after your procedure. Summary  Cesarean birth, or cesarean delivery, is the surgical delivery of a baby through an incision in the abdomen and the uterus.  Follow instructions from your health care provider about eating or  drinking restrictions before the procedure.  You will have some pain after the procedure. Medicines will be available to help control your pain.  Hospital staff will encourage and support bonding with your baby after the procedure. Your hospital may have you and your baby to stay in the same room (rooming in) during your hospital stay to encourage successful bonding and breastfeeding. This information is not intended to replace advice given to you by your health care provider. Make sure you discuss any questions you have with your health care provider. Document Revised: 04/06/2018 Document Reviewed: 04/06/2018 Elsevier Patient Education  2020 [employer]  

## 2020-02-09 ENCOUNTER — Other Ambulatory Visit: Payer: Self-pay

## 2020-02-09 ENCOUNTER — Other Ambulatory Visit
Admission: RE | Admit: 2020-02-09 | Discharge: 2020-02-09 | Disposition: A | Discharge status: Home / Self Care | Payer: Medicaid Other | Source: Ambulatory Visit | Attending: Obstetrics and Gynecology | Admitting: Obstetrics and Gynecology

## 2020-02-09 DIAGNOSIS — Z01812 Encounter for preprocedural laboratory examination: Secondary | ICD-10-CM | POA: Insufficient documentation

## 2020-02-09 DIAGNOSIS — Z20822 Contact with and (suspected) exposure to covid-19: Secondary | ICD-10-CM | POA: Insufficient documentation

## 2020-02-09 LAB — CBC
HCT: 28.2 % — ABNORMAL LOW (ref 36.0–46.0)
Hemoglobin: 8.5 g/dL — ABNORMAL LOW (ref 12.0–15.0)
MCH: 23.7 pg — ABNORMAL LOW (ref 26.0–34.0)
MCHC: 30.1 g/dL (ref 30.0–36.0)
MCV: 78.8 fL — ABNORMAL LOW (ref 80.0–100.0)
Platelets: 226 10*3/uL (ref 150–400)
RBC: 3.58 MIL/uL — ABNORMAL LOW (ref 3.87–5.11)
RDW: 15.1 % (ref 11.5–15.5)
WBC: 5.7 10*3/uL (ref 4.0–10.5)
nRBC: 0.3 % — ABNORMAL HIGH (ref 0.0–0.2)

## 2020-02-09 LAB — RAPID HIV SCREEN (HIV 1/2 AB+AG)
HIV 1/2 Antibodies: NONREACTIVE
HIV-1 P24 Antigen - HIV24: NONREACTIVE

## 2020-02-09 LAB — SARS CORONAVIRUS 2 (TAT 6-24 HRS): SARS Coronavirus 2: NEGATIVE

## 2020-02-09 NOTE — Pre-Procedure Instructions (Signed)
Pre-Admit Testing Provider Notification Note  Provider Notified: Dr. Jean Rosenthal  Notification Mode: Secure Chat  Reason: Abnormal lab result. (H&H)  Response:    Additional Information: Noted on Pre-Admit Worksheet.  Signed: Alvester Morin, RN

## 2020-02-10 LAB — RPR: RPR Ser Ql: NONREACTIVE

## 2020-02-11 ENCOUNTER — Inpatient Hospital Stay
Admission: RE | Admit: 2020-02-11 | Discharge: 2020-02-14 | DRG: 787 | Disposition: A | Discharge status: Home / Self Care | Payer: Medicaid Other | Attending: Obstetrics and Gynecology | Admitting: Obstetrics and Gynecology

## 2020-02-11 ENCOUNTER — Inpatient Hospital Stay: Payer: Medicaid Other | Admitting: Anesthesiology

## 2020-02-11 ENCOUNTER — Encounter
Admission: RE | Disposition: A | Discharge status: Home / Self Care | Payer: Self-pay | Source: Home / Self Care | Attending: Obstetrics and Gynecology

## 2020-02-11 ENCOUNTER — Other Ambulatory Visit: Payer: Self-pay

## 2020-02-11 ENCOUNTER — Encounter: Payer: Self-pay | Admitting: Obstetrics and Gynecology

## 2020-02-11 DIAGNOSIS — D509 Iron deficiency anemia, unspecified: Secondary | ICD-10-CM | POA: Diagnosis present

## 2020-02-11 DIAGNOSIS — J45909 Unspecified asthma, uncomplicated: Secondary | ICD-10-CM | POA: Diagnosis present

## 2020-02-11 DIAGNOSIS — O34211 Maternal care for low transverse scar from previous cesarean delivery: Secondary | ICD-10-CM | POA: Diagnosis present

## 2020-02-11 DIAGNOSIS — O9081 Anemia of the puerperium: Secondary | ICD-10-CM | POA: Diagnosis not present

## 2020-02-11 DIAGNOSIS — Z3A39 39 weeks gestation of pregnancy: Secondary | ICD-10-CM

## 2020-02-11 DIAGNOSIS — O99019 Anemia complicating pregnancy, unspecified trimester: Secondary | ICD-10-CM | POA: Diagnosis present

## 2020-02-11 DIAGNOSIS — O34219 Maternal care for unspecified type scar from previous cesarean delivery: Secondary | ICD-10-CM | POA: Diagnosis not present

## 2020-02-11 DIAGNOSIS — O99013 Anemia complicating pregnancy, third trimester: Secondary | ICD-10-CM | POA: Diagnosis not present

## 2020-02-11 DIAGNOSIS — O403XX0 Polyhydramnios, third trimester, not applicable or unspecified: Secondary | ICD-10-CM

## 2020-02-11 DIAGNOSIS — O0990 Supervision of high risk pregnancy, unspecified, unspecified trimester: Secondary | ICD-10-CM

## 2020-02-11 DIAGNOSIS — Z98891 History of uterine scar from previous surgery: Secondary | ICD-10-CM

## 2020-02-11 DIAGNOSIS — O9952 Diseases of the respiratory system complicating childbirth: Secondary | ICD-10-CM | POA: Diagnosis present

## 2020-02-11 DIAGNOSIS — D62 Acute posthemorrhagic anemia: Secondary | ICD-10-CM | POA: Diagnosis not present

## 2020-02-11 DIAGNOSIS — Z3480 Encounter for supervision of other normal pregnancy, unspecified trimester: Secondary | ICD-10-CM

## 2020-02-11 DIAGNOSIS — O99824 Streptococcus B carrier state complicating childbirth: Secondary | ICD-10-CM | POA: Diagnosis present

## 2020-02-11 LAB — PREPARE RBC (CROSSMATCH)

## 2020-02-11 SURGERY — Surgical Case
Anesthesia: Spinal

## 2020-02-11 MED ORDER — FENTANYL CITRATE (PF) 100 MCG/2ML IJ SOLN
INTRAMUSCULAR | Status: AC
  Filled 2020-02-11: qty 2

## 2020-02-11 MED ORDER — LIDOCAINE HCL (PF) 1 % IJ SOLN
INTRAMUSCULAR | Status: DC | PRN
  Administered 2020-02-11: 3 mL via SUBCUTANEOUS

## 2020-02-11 MED ORDER — OXYTOCIN 40 UNITS IN NORMAL SALINE INFUSION - SIMPLE MED: 2.5000 [IU]/h | INTRAVENOUS | Status: AC

## 2020-02-11 MED ORDER — LACTATED RINGERS IV SOLN: Freq: Once | INTRAVENOUS | Status: AC

## 2020-02-11 MED ORDER — SOD CITRATE-CITRIC ACID 500-334 MG/5ML PO SOLN
30.0000 mL | ORAL | Status: AC
  Administered 2020-02-11: 30 mL via ORAL
  Filled 2020-02-11: qty 30

## 2020-02-11 MED ORDER — DIPHENHYDRAMINE HCL 25 MG PO CAPS: 25.0000 mg | ORAL_CAPSULE | ORAL | Status: DC | PRN

## 2020-02-11 MED ORDER — MORPHINE SULFATE (PF) 0.5 MG/ML IJ SOLN
INTRAMUSCULAR | Status: DC | PRN
  Administered 2020-02-11: .1 mg via INTRATHECAL

## 2020-02-11 MED ORDER — NALBUPHINE HCL 10 MG/ML IJ SOLN: 5.0000 mg | INTRAMUSCULAR | Status: DC | PRN

## 2020-02-11 MED ORDER — OXYCODONE-ACETAMINOPHEN 5-325 MG PO TABS
2.0000 | ORAL_TABLET | ORAL | Status: DC | PRN
  Administered 2020-02-13 – 2020-02-14 (×2): 2 via ORAL
  Filled 2020-02-11: qty 2

## 2020-02-11 MED ORDER — OXYTOCIN 40 UNITS IN NORMAL SALINE INFUSION - SIMPLE MED
INTRAVENOUS | Status: DC | PRN
  Administered 2020-02-11: 1000 mL via INTRAVENOUS

## 2020-02-11 MED ORDER — BUPIVACAINE HCL (PF) 0.5 % IJ SOLN
5.0000 mL | Freq: Once | INTRAMUSCULAR | Status: DC
  Administered 2020-02-11: 5 mL
  Filled 2020-02-11: qty 30

## 2020-02-11 MED ORDER — IBUPROFEN 600 MG PO TABS
600.0000 mg | ORAL_TABLET | Freq: Four times a day (QID) | ORAL | Status: DC
  Administered 2020-02-12 – 2020-02-14 (×6): 600 mg via ORAL
  Filled 2020-02-11 (×7): qty 1

## 2020-02-11 MED ORDER — DIBUCAINE (PERIANAL) 1 % EX OINT: 1.0000 "application " | TOPICAL_OINTMENT | CUTANEOUS | Status: DC | PRN

## 2020-02-11 MED ORDER — DIPHENHYDRAMINE HCL 50 MG/ML IJ SOLN: 12.5000 mg | INTRAMUSCULAR | Status: DC | PRN

## 2020-02-11 MED ORDER — ONDANSETRON HCL 4 MG/2ML IJ SOLN
INTRAMUSCULAR | Status: DC | PRN
  Administered 2020-02-11: 4 mg via INTRAVENOUS

## 2020-02-11 MED ORDER — NALBUPHINE HCL 10 MG/ML IJ SOLN: 5.0000 mg | Freq: Once | INTRAMUSCULAR | Status: DC | PRN

## 2020-02-11 MED ORDER — MENTHOL 3 MG MT LOZG
1.0000 | LOZENGE | OROMUCOSAL | Status: DC | PRN
  Filled 2020-02-11: qty 9

## 2020-02-11 MED ORDER — OXYCODONE HCL 5 MG PO TABS
5.0000 mg | ORAL_TABLET | ORAL | Status: AC | PRN
  Administered 2020-02-11: 5 mg via ORAL
  Filled 2020-02-11: qty 1

## 2020-02-11 MED ORDER — KETOROLAC TROMETHAMINE 30 MG/ML IJ SOLN: 30.0000 mg | Freq: Four times a day (QID) | INTRAMUSCULAR | Status: AC

## 2020-02-11 MED ORDER — BUPIVACAINE IN DEXTROSE 0.75-8.25 % IT SOLN
INTRATHECAL | Status: DC | PRN
  Administered 2020-02-11: 1.6 mL via INTRATHECAL

## 2020-02-11 MED ORDER — ONDANSETRON HCL 4 MG/2ML IJ SOLN: 4.0000 mg | Freq: Three times a day (TID) | INTRAMUSCULAR | Status: DC | PRN

## 2020-02-11 MED ORDER — PHENYLEPHRINE HCL (PRESSORS) 10 MG/ML IV SOLN
INTRAVENOUS | Status: AC
  Filled 2020-02-11: qty 1

## 2020-02-11 MED ORDER — DIPHENHYDRAMINE HCL 25 MG PO CAPS: 25.0000 mg | ORAL_CAPSULE | Freq: Four times a day (QID) | ORAL | Status: DC | PRN

## 2020-02-11 MED ORDER — SODIUM CHLORIDE 0.9% FLUSH: 3.0000 mL | INTRAVENOUS | Status: DC | PRN

## 2020-02-11 MED ORDER — MEASLES, MUMPS & RUBELLA VAC IJ SOLR
0.5000 mL | INTRAMUSCULAR | Status: AC | PRN
  Administered 2020-02-14: 0.5 mL via SUBCUTANEOUS
  Filled 2020-02-11 (×2): qty 0.5

## 2020-02-11 MED ORDER — FERROUS SULFATE 325 (65 FE) MG PO TABS
325.0000 mg | ORAL_TABLET | Freq: Two times a day (BID) | ORAL | Status: DC
  Administered 2020-02-12 – 2020-02-14 (×5): 325 mg via ORAL
  Filled 2020-02-11 (×4): qty 1

## 2020-02-11 MED ORDER — OXYCODONE-ACETAMINOPHEN 5-325 MG PO TABS
1.0000 | ORAL_TABLET | ORAL | Status: DC | PRN
  Administered 2020-02-12 – 2020-02-13 (×4): 1 via ORAL
  Filled 2020-02-11 (×6): qty 1

## 2020-02-11 MED ORDER — BUPIVACAINE HCL (PF) 0.5 % IJ SOLN
INTRAMUSCULAR | Status: DC | PRN
  Administered 2020-02-11: 10 mL

## 2020-02-11 MED ORDER — CEFAZOLIN SODIUM-DEXTROSE 2-4 GM/100ML-% IV SOLN
2.0000 g | INTRAVENOUS | Status: AC
  Administered 2020-02-11: 2 g via INTRAVENOUS
  Filled 2020-02-11: qty 100

## 2020-02-11 MED ORDER — PRENATAL PLUS 27-1 MG PO TABS
1.0000 | ORAL_TABLET | Freq: Every day | ORAL | Status: DC
  Administered 2020-02-12 – 2020-02-14 (×3): 1 via ORAL
  Filled 2020-02-11 (×4): qty 1

## 2020-02-11 MED ORDER — ACETAMINOPHEN 325 MG PO TABS
650.0000 mg | ORAL_TABLET | Freq: Four times a day (QID) | ORAL | Status: AC
  Administered 2020-02-11 – 2020-02-12 (×4): 650 mg via ORAL
  Filled 2020-02-11 (×4): qty 2

## 2020-02-11 MED ORDER — KETOROLAC TROMETHAMINE 30 MG/ML IJ SOLN
INTRAMUSCULAR | Status: DC | PRN
  Administered 2020-02-11: 30 mg via INTRAVENOUS

## 2020-02-11 MED ORDER — ALBUTEROL SULFATE (2.5 MG/3ML) 0.083% IN NEBU: 3.0000 mL | INHALATION_SOLUTION | Freq: Three times a day (TID) | RESPIRATORY_TRACT | Status: DC | PRN

## 2020-02-11 MED ORDER — SENNOSIDES-DOCUSATE SODIUM 8.6-50 MG PO TABS
2.0000 | ORAL_TABLET | ORAL | Status: DC
  Administered 2020-02-12 – 2020-02-14 (×3): 2 via ORAL
  Filled 2020-02-11 (×3): qty 2

## 2020-02-11 MED ORDER — COCONUT OIL OIL
1.0000 "application " | TOPICAL_OIL | Status: DC | PRN
  Administered 2020-02-12: 1 via TOPICAL
  Filled 2020-02-11: qty 120

## 2020-02-11 MED ORDER — CARBOPROST TROMETHAMINE 250 MCG/ML IM SOLN
INTRAMUSCULAR | Status: AC
  Filled 2020-02-11: qty 1

## 2020-02-11 MED ORDER — SODIUM CHLORIDE 0.9 % IV SOLN
INTRAVENOUS | Status: DC | PRN
  Administered 2020-02-11: 50 ug/min via INTRAVENOUS

## 2020-02-11 MED ORDER — METHYLERGONOVINE MALEATE 0.2 MG/ML IJ SOLN
INTRAMUSCULAR | Status: AC
  Filled 2020-02-11: qty 1

## 2020-02-11 MED ORDER — MISOPROSTOL 200 MCG PO TABS
ORAL_TABLET | ORAL | Status: AC
  Filled 2020-02-11: qty 5

## 2020-02-11 MED ORDER — BUPIVACAINE HCL (PF) 0.5 % IJ SOLN
5.0000 mL | Freq: Once | INTRAMUSCULAR | Status: DC
  Administered 2020-02-11: 5 mL

## 2020-02-11 MED ORDER — FAMOTIDINE 20 MG PO TABS: 20.0000 mg | ORAL_TABLET | Freq: Every day | ORAL | Status: DC | PRN

## 2020-02-11 MED ORDER — SIMETHICONE 80 MG PO CHEW
80.0000 mg | CHEWABLE_TABLET | Freq: Three times a day (TID) | ORAL | Status: DC
  Administered 2020-02-11 – 2020-02-14 (×7): 80 mg via ORAL
  Filled 2020-02-11 (×8): qty 1

## 2020-02-11 MED ORDER — LACTATED RINGERS IV SOLN: INTRAVENOUS | Status: DC

## 2020-02-11 MED ORDER — KETOROLAC TROMETHAMINE 30 MG/ML IJ SOLN
30.0000 mg | Freq: Four times a day (QID) | INTRAMUSCULAR | Status: AC
  Administered 2020-02-11 – 2020-02-12 (×4): 30 mg via INTRAVENOUS
  Filled 2020-02-11 (×4): qty 1

## 2020-02-11 MED ORDER — OXYTOCIN 40 UNITS IN NORMAL SALINE INFUSION - SIMPLE MED
INTRAVENOUS | Status: AC
  Filled 2020-02-11: qty 1000

## 2020-02-11 MED ORDER — MORPHINE SULFATE (PF) 0.5 MG/ML IJ SOLN
INTRAMUSCULAR | Status: AC
  Filled 2020-02-11: qty 10

## 2020-02-11 MED ORDER — BUPIVACAINE 0.25 % ON-Q PUMP DUAL CATH 400 ML
400.0000 mL | INJECTION | Status: DC
  Filled 2020-02-11: qty 400

## 2020-02-11 MED ORDER — FENTANYL CITRATE (PF) 100 MCG/2ML IJ SOLN
INTRAMUSCULAR | Status: DC | PRN
  Administered 2020-02-11: 15 ug via INTRATHECAL
  Administered 2020-02-11: 85 ug via INTRATHECAL

## 2020-02-11 MED ORDER — KETOROLAC TROMETHAMINE 30 MG/ML IJ SOLN
INTRAMUSCULAR | Status: AC
  Filled 2020-02-11: qty 1

## 2020-02-11 MED ORDER — NALOXONE HCL 4 MG/10ML IJ SOLN
1.0000 ug/kg/h | INTRAVENOUS | Status: DC | PRN
  Filled 2020-02-11: qty 5

## 2020-02-11 MED ORDER — MEPERIDINE HCL 25 MG/ML IJ SOLN: 6.2500 mg | INTRAMUSCULAR | Status: DC | PRN

## 2020-02-11 MED ORDER — ONDANSETRON HCL 4 MG/2ML IJ SOLN
INTRAMUSCULAR | Status: AC
  Filled 2020-02-11: qty 2

## 2020-02-11 MED ORDER — NALOXONE HCL 0.4 MG/ML IJ SOLN: 0.4000 mg | INTRAMUSCULAR | Status: DC | PRN

## 2020-02-11 MED ORDER — WITCH HAZEL-GLYCERIN EX PADS: 1.0000 "application " | MEDICATED_PAD | CUTANEOUS | Status: DC | PRN

## 2020-02-11 SURGICAL SUPPLY — 30 items
CANISTER SUCT 3000ML PPV (MISCELLANEOUS) ×2 IMPLANT
CATH KIT ON-Q SILVERSOAK 5IN (CATHETERS) ×4 IMPLANT
COVER WAND RF STERILE (DRAPES) ×2 IMPLANT
DERMABOND ADVANCED (GAUZE/BANDAGES/DRESSINGS) ×1
DERMABOND ADVANCED .7 DNX12 (GAUZE/BANDAGES/DRESSINGS) ×1 IMPLANT
DRSG OPSITE POSTOP 4X10 (GAUZE/BANDAGES/DRESSINGS) ×2 IMPLANT
DRSG TELFA 3X8 NADH (GAUZE/BANDAGES/DRESSINGS) ×2 IMPLANT
ELECT CAUTERY BLADE 6.4 (BLADE) ×2 IMPLANT
ELECT REM PT RETURN 9FT ADLT (ELECTROSURGICAL) ×2
ELECTRODE REM PT RTRN 9FT ADLT (ELECTROSURGICAL) ×1 IMPLANT
GAUZE SPONGE 4X4 12PLY STRL (GAUZE/BANDAGES/DRESSINGS) ×2 IMPLANT
GLOVE BIO SURGEON STRL SZ7 (GLOVE) ×2 IMPLANT
GLOVE INDICATOR 7.5 STRL GRN (GLOVE) ×2 IMPLANT
GOWN STRL REUS W/ TWL LRG LVL3 (GOWN DISPOSABLE) ×3 IMPLANT
GOWN STRL REUS W/TWL LRG LVL3 (GOWN DISPOSABLE) ×3
NS IRRIG 1000ML POUR BTL (IV SOLUTION) ×2 IMPLANT
PACK C SECTION (MISCELLANEOUS) ×2 IMPLANT
PAD OB MATERNITY 4.3X12.25 (PERSONAL CARE ITEMS) ×4 IMPLANT
PAD PREP 24X41 OB/GYN DISP (PERSONAL CARE ITEMS) ×2 IMPLANT
PENCIL SMOKE ULTRAEVAC 22 CON (MISCELLANEOUS) ×2 IMPLANT
STRIP CLOSURE SKIN 1/2X4 (GAUZE/BANDAGES/DRESSINGS) ×2 IMPLANT
SUT MNCRL 4-0 (SUTURE) ×1
SUT MNCRL 4-0 27XMFL (SUTURE) ×1
SUT PDS AB 1 TP1 96 (SUTURE) ×2 IMPLANT
SUT PLAIN GUT 0 (SUTURE) IMPLANT
SUT VIC AB 0 CTX 36 (SUTURE) ×2
SUT VIC AB 0 CTX36XBRD ANBCTRL (SUTURE) ×2 IMPLANT
SUT VICRYL 3-0 27IN SH (SUTURE) ×2 IMPLANT
SUTURE MNCRL 4-0 27XMF (SUTURE) ×1 IMPLANT
SWABSTK COMLB BENZOIN TINCTURE (MISCELLANEOUS) ×2 IMPLANT

## 2020-02-11 NOTE — Anesthesia Procedure Notes (Signed)
Spinal  Patient location during procedure: OR Start time: 02/11/2020 12:40 PM End time: 02/11/2020 12:43 PM Staffing Performed: resident/CRNA  Anesthesiologist: Gunnar Fusi, MD Resident/CRNA: Hedda Slade, CRNA Preanesthetic Checklist Completed: patient identified, IV checked, site marked, risks and benefits discussed, surgical consent, monitors and equipment checked, pre-op evaluation and timeout performed Spinal Block Patient position: sitting Prep: ChloraPrep Patient monitoring: heart rate, continuous pulse ox, blood pressure and cardiac monitor Approach: midline Location: L3-4 Injection technique: single-shot Needle Needle type: Whitacre and Introducer  Needle gauge: 24 G Needle length: 9 cm Additional Notes Negative paresthesia. Negative blood return. Positive free-flowing CSF. Expiration date of kit checked and confirmed. Patient tolerated procedure well, without complications.

## 2020-02-11 NOTE — Op Note (Signed)
Cesarean Section Operative Note    Patient Name: Nicole Hartman  MRN: [id number]  Date of Service: 02/11/2020   Pre-operative Diagnosis:  1) History of cesarean delivery, desires repeat.  2) intrauterine pregnancy at [redacted]w[redacted]d   Post-operative Diagnosis:  1) History of cesarean delivery, desires repeat.  2) intrauterine pregnancy at [redacted]w[redacted]d    Procedure: Repeat Low Transverse Cesarean Section via Pfannenstiel incision with double layer uterine closure  Surgeon: Surgeon(s) and Role:    Will Bonnet, MD - Primary   Assistants: Rod Can, CNM; No other capable assistant available, in surgery requiring high level assistant.  Anesthesia: spinal   Findings:  1) normal appearing gravid uterus, fallopian tubes, and ovaries 2) viable female infant with weight of 3,790 grams, APGARs 8 and 10   Estimated Blood Loss: 540 mL  Total IV Fluids: 1,000 ml   Urine Output: 400 mL clear urine at end of procedure  Specimens: none  Complications: no complications  Disposition: PACU - hemodynamically stable.   Maternal Condition: stable   Baby condition / location:  Couplet care / Skin to Skin  Procedure Details:  The patient was seen in the Holding Room. The risks, benefits, complications, treatment options, and expected outcomes were discussed with the patient. The patient concurred with the proposed plan, giving informed consent. identified as Nicole Hartman Section and the procedure verified as C-Section Delivery. A Time Out was held and the above information confirmed.   After induction of anesthesia, the patient was draped and prepped in the usual sterile manner. A Pfannenstiel incision was made and carried down through the subcutaneous tissue to the fascia. Fascial incision was made and extended transversely. The fascia was separated from the underlying rectus tissue superiorly and inferiorly. The peritoneum was identified and entered. Peritoneal incision was extended longitudinally.  The bladder flap was not bluntly or sharply freed from the lower uterine segment. A low transverse uterine incision was made and the hysterotomy was extended with cranial-caudal tension. Delivered from cephalic presentation was a 3,790 gram Living newborn infant(s) or Female with Apgar scores of 8 at one minute and 10 at five minutes. Cord ph was not sent the umbilical cord was clamped and cut cord blood was obtained for evaluation. The placenta was removed Intact and appeared normal. The uterine outline, tubes and ovaries appeared normal. The uterine incision was closed with running locked sutures of 0 Vicryl.  A second layer of the same suture was thrown in an imbricating fashion.  Hemostasis was assured.  The uterus was returned to the abdomen and the paracolic gutters were cleared of all clots and debris.  The rectus muscles were inspected and found to be hemostatic.  The On-Q catheter pumps were inserted in accordance with the manufacturer's recommendations.  The catheters were inserted approximately 4cm cephelad to the incision line, approximately 1cm apart, straddling the midline.  They were inserted to a depth of the 4th mark. They were positioned superficial to the rectus abdominus muscles and deep to the rectus fascia.    The fascia was then reapproximated with running sutures of 1-0 PDS, looped. The subcuticular closure was performed using 4-0 monocryl. The skin closure was reinforced using surgical skin glue.  The On-Q catheters were bolused with 5 mL of 0.5% marcaine plain for a total of 10 mL.  The catheters were affixed to the skin with surgical skin glue, steri-strips, and tegaderm.    The surgical assistant performed tissue retraction, assistance with suturing, and fundal pressure.  Instrument, sponge, and needle counts were correct prior the abdominal closure and were correct at the conclusion of the case.  The patient received Ancef 2 gram IV prior to skin incision (within 30 minutes).  For VTE prophylaxis she was wearing SCDs throughout the case.  The assistant surgeon was a CNM due to lack of availability of another Sales promotion account executive.   Signed: Conard Novak, MD 02/11/2020 1:58 PM

## 2020-02-11 NOTE — Discharge Summary (Signed)
Postpartum Discharge Summary   Patient Name: Nicole Hartman DOB: 05-19-1996 MRN: [id number]  Date of admission: 02/11/2020  Date of Delivery: 02/11/2020  Delivering Provider: Prentice Docker D   Date of discharge: 02/14/2020  Admitting diagnosis: History of cesarean delivery [Z98.891] Intrauterine pregnancy: [redacted]w[redacted]d    Secondary diagnosis:  Principal Problem:   Supervision of high risk pregnancy, antepartum Active Problems:   History of cesarean delivery   Anemia in pregnancy   Encounter for care or examination of lactating mother  Additional problems: none     Discharge diagnosis: Term Pregnancy Delivered and Anemia due to severe iron deficiency of pregnancy                                                                                             Post partum procedures:blood transfusion  Augmentation: n/a  Complications: None  Hospital course:  Sceduled C/S   24y.o. yo G2P2002 at 353w2das admitted to the hospital 02/11/2020 for scheduled cesarean section with the following indication:Elective Repeat.  Membrane Rupture Time/Date: 1:04 PM ,02/11/2020   Patient delivered a Viable infant.02/11/2020  Details of operation can be found in separate operative note.  Patient had an uncomplicated postpartum course.  She is ambulating, tolerating a regular diet, passing flatus, and urinating well. Patient is discharged home in stable condition on  02/14/20        Delivery time: 1:05 PM    Magnesium Sulfate received: No BMZ received: No Rhophylac:No MMR:Yes Transfusion:Yes  Physical exam  Vitals:   02/13/20 1700 02/13/20 2000 02/13/20 2312 02/14/20 0823  BP: 122/81 121/78 122/72 121/75  Pulse: 77 72 68 79  Resp: '16 20 20 18  '$ Temp: 97.9 F (36.6 C) 98.3 F (36.8 C) 98.1 F (36.7 C) 98.3 F (36.8 C)  TempSrc: Oral Oral Oral Oral  SpO2: 99% 100% 99% 97%  Weight:      Height:       General: alert, cooperative and no distress Lochia: appropriate Uterine Fundus: firm  Incision: Dressing is clean, dry, and intact, On Q pump intact DVT Evaluation: No evidence of DVT seen on physical exam. Labs: Lab Results  Component Value Date   WBC 6.9 02/12/2020   HGB 7.8 (L) 02/12/2020   HCT 26.5 (L) 02/12/2020   MCV 83.1 02/12/2020   PLT 196 02/12/2020   CMP Latest Ref Rng & Units 02/09/2013  Glucose 65 - 99 mg/dL 74  BUN 9 - 21 mg/dL 12  Creatinine 0.60 - 1.30 mg/dL 0.85  Sodium 132 - 141 mmol/L 139  Potassium 3.3 - 4.7 mmol/L 3.7  Chloride 97 - 107 mmol/L 108(H)  CO2 16 - 25 mmol/L 24  Calcium 9.0 - 10.7 mg/dL 8.9(L)  Total Protein 6.4 - 8.6 g/dL 8.2  Total Bilirubin 0.2 - 1.0 mg/dL 0.2  Alkaline Phos 82 - 169 Unit/L 62(L)  AST 0 - 26 Unit/L 25  ALT 12 - 78 U/L 19   Edinburgh Score: Edinburgh Postnatal Depression Scale Screening Tool 02/14/2020  I have been able to laugh and see the funny side of things. 0  I have looked forward with  enjoyment to things. 0  I have blamed myself unnecessarily when things went wrong. 0  I have been anxious or worried for no good reason. 1  I have felt scared or panicky for no good reason. 0  Things have been getting on top of me. 2  I have been so unhappy that I have had difficulty sleeping. 0  I have felt sad or miserable. 1  I have been so unhappy that I have been crying. 1  The thought of harming myself has occurred to me. 0  Edinburgh Postnatal Depression Scale Total 5    Discharge instruction: per After Visit Summary and "Baby and Me Booklet".  After visit meds:  Allergies as of 02/14/2020   No Known Allergies     Medication List    STOP taking these medications   famotidine 20 MG tablet Commonly known as: PEPCID   fluticasone 50 MCG/ACT nasal spray Commonly known as: FLONASE     TAKE these medications   norethindrone 0.35 MG tablet Commonly known as: MICRONOR Take 1 tablet (0.35 mg total) by mouth daily. Start taking on: Mar 06, 2020   oxyCODONE 5 MG immediate release tablet Commonly known as:  Roxicodone Take 1 tablet (5 mg total) by mouth every 6 (six) hours as needed for up to 5 days for severe pain.   PNV Prenatal Plus Multivitamin 27-1 MG Tabs Take 1 tablet by mouth daily.   ProAir HFA 108 (90 Base) MCG/ACT inhaler Generic drug: albuterol Inhale 1 puff into the lungs 3 (three) times daily as needed for wheezing (for cough).            Discharge Care Instructions  (From admission, onward)         Start     Ordered   02/14/20 0000  Discharge wound care:    Comments: Keep incision dry, clean.   02/14/20 0947          Diet: routine diet  Activity: Advance as tolerated. Pelvic rest for 6 weeks.   Outpatient follow up: 1 week Follow up Appt:No future appointments.  Follow-up Information    Will Bonnet, MD. Go in 1 week(s).   Specialty: Obstetrics and Gynecology Why: For incision check Contact information: [address] [place] [address] [phone]          Delivery mode:  CS Anticipated Birth Control:  POPs PP Procedures needed: Incision check   Newborn Data: Live born female [place] Birth Weight: 8 lb 5.7 oz (3,790 g) APGAR: 30, 10  Newborn Delivery   Birth date/time: 02/11/2020 13:05:00 Delivery type: C-Section, Low Transverse Trial of labor: No C-section categorization: Repeat      Baby Feeding: Breast Disposition:home with mother  SIGNED: Rod Can, CNM

## 2020-02-11 NOTE — Interval H&P Note (Signed)
History and Physical Interval Note:  02/11/2020 12:29 PM  Nicole Hartman  has presented today for surgery, with the diagnosis of History of cesarean delivery, desires repeat.  The various methods of treatment have been discussed with the patient and family. After consideration of risks, benefits and other options for treatment, the patient has consented to  Procedure(s) with comments: CESAREAN SECTION (N/A) - RNFA as a surgical intervention.  The patient's history has been reviewed, patient examined, no change in status, stable for surgery.  I have reviewed the patient's chart and labs.  Questions were answered to the patient's satisfaction.  Discussed starting hemoglobin and high risk of need for blood transfusion.  She accepts a blood transfusion.  I personally reviewed risks and benefits of transfusion. She voiced understanding.   Thomasene Mohair, MD, [facility], [facility] 02/11/2020 12:31 PM

## 2020-02-11 NOTE — Lactation Note (Signed)
This note was copied from a baby's chart. Lactation Consultation Note  Patient Name: Nicole Hartman [id number] Date: 02/11/2020 Reason for consult: Initial assessment;1st time breastfeeding;Term   Maternal Data Formula Feeding for Exclusion: No Does the patient have breastfeeding experience prior to this delivery?: Yes  Feeding Feeding Type: Breast Fed Baby eager and latches easily, in cradle hold with tummy turned toward's mom's stomach  LATCH Score Latch: Grasps breast easily, tongue down, lips flanged, rhythmical sucking.  Audible Swallowing: A few with stimulation  Type of Nipple: Everted at rest and after stimulation  Comfort (Breast/Nipple): Soft / non-tender  Hold (Positioning): No assistance needed to correctly position infant at breast.  LATCH Score: 9  Interventions Interventions: Breast feeding basics reviewed;Skin to skin;Hand express;Support pillows  Lactation Tools Discussed/Used WIC Program: Yes   Consult Status Consult Status: PRN    Dyann Kief 02/11/2020, 2:54 PM

## 2020-02-11 NOTE — Anesthesia Preprocedure Evaluation (Signed)
Anesthesia Evaluation  Patient identified by MRN, date of birth, ID band Patient awake    Reviewed: Allergy & Precautions, NPO status , Patient's Chart, lab work & pertinent test results  History of Anesthesia Complications Negative for: history of anesthetic complications  Airway Mallampati: I  TM Distance: >3 FB Neck ROM: Full    Dental no notable dental hx. (+) Teeth Intact   Pulmonary asthma , neg sleep apnea, neg COPD, Patient abstained from smoking.Not current smoker,  Hospitalized once as child Took inhaler today prophylactically   Pulmonary exam normal breath sounds clear to auscultation       Cardiovascular Exercise Tolerance: Good METS(-) hypertension(-) CAD and (-) Past MI negative cardio ROS  (-) dysrhythmias  Rhythm:Regular Rate:Normal - Systolic murmurs    Neuro/Psych  Headaches, PSYCHIATRIC DISORDERS Anxiety Depression    GI/Hepatic GERD  Controlled,(+)     (-) substance abuse  ,   Endo/Other  neg diabetes  Renal/GU negative Renal ROS     Musculoskeletal   Abdominal   Peds  Hematology  (+) anemia ,   Anesthesia Other Findings Past Medical History: No date: Anemia No date: Asthma     Comment:  exercise induced 03/02/2016: Cesarean delivery delivered 2014: Depression No date: GERD (gastroesophageal reflux disease) No date: Headache     Comment:  h/o migraines 2014: PTSD (post-traumatic stress disorder)  Reproductive/Obstetrics (+) Pregnancy                             Anesthesia Physical Anesthesia Plan  ASA: II  Anesthesia Plan: Spinal   Post-op Pain Management:    Induction:   PONV Risk Score and Plan: 4 or greater and Ondansetron and Dexamethasone  Airway Management Planned: Natural Airway  Additional Equipment:   Intra-op Plan:   Post-operative Plan:   Informed Consent: I have reviewed the patients History and Physical, chart, labs and discussed  the procedure including the risks, benefits and alternatives for the proposed anesthesia with the patient or authorized representative who has indicated his/her understanding and acceptance.       Plan Discussed with: CRNA and Surgeon  Anesthesia Plan Comments: (Discussed R/B/A of neuraxial anesthesia technique with patient: - rare risks of spinal/epidural hematoma, nerve damage, infection - Risk of PDPH - Risk of itching - Risk of nausea and vomiting - Risk of conversion to general anesthesia and its associated risks, including sore throat, damage to lips/teeth/oropharynx, and rare risks such as cardiac and respiratory events. - Risk of surgical bleeding requiring blood products Patient voiced understanding.)        Anesthesia Quick Evaluation

## 2020-02-11 NOTE — Transfer of Care (Signed)
Immediate Anesthesia Transfer of Care Note  Patient: Nicole Hartman  Procedure(s) Performed: CESAREAN SECTION (N/A )  Patient Location: PACU  Anesthesia Type:spinal  Level of Consciousness: awake, alert  and oriented  Airway & Oxygen Therapy: Patient Spontanous Breathing  Post-op Assessment: Report given to RN and Post -op Vital signs reviewed and stable  Post vital signs: Reviewed and stable  Last Vitals:  Vitals Value Taken Time  BP 121/71 02/11/20 1401  Temp 36.4 C 02/11/20 1401  Pulse 67 02/11/20 1401  Resp 19 02/11/20 1401  SpO2 100 % 02/11/20 1401    Last Pain:  Vitals:   02/11/20 1401  TempSrc:   PainSc: 0-No pain         Complications: No apparent anesthesia complications

## 2020-02-12 LAB — CBC
HCT: 22.8 % — ABNORMAL LOW (ref 36.0–46.0)
HCT: 25.5 % — ABNORMAL LOW (ref 36.0–46.0)
HCT: 26.5 % — ABNORMAL LOW (ref 36.0–46.0)
Hemoglobin: 6.8 g/dL — ABNORMAL LOW (ref 12.0–15.0)
Hemoglobin: 7.7 g/dL — ABNORMAL LOW (ref 12.0–15.0)
Hemoglobin: 7.8 g/dL — ABNORMAL LOW (ref 12.0–15.0)
MCH: 23.7 pg — ABNORMAL LOW (ref 26.0–34.0)
MCH: 24.4 pg — ABNORMAL LOW (ref 26.0–34.0)
MCH: 24.5 pg — ABNORMAL LOW (ref 26.0–34.0)
MCHC: 29.4 g/dL — ABNORMAL LOW (ref 30.0–36.0)
MCHC: 29.8 g/dL — ABNORMAL LOW (ref 30.0–36.0)
MCHC: 30.2 g/dL (ref 30.0–36.0)
MCV: 79.4 fL — ABNORMAL LOW (ref 80.0–100.0)
MCV: 81 fL (ref 80.0–100.0)
MCV: 83.1 fL (ref 80.0–100.0)
Platelets: 170 10*3/uL (ref 150–400)
Platelets: 182 10*3/uL (ref 150–400)
Platelets: 196 10*3/uL (ref 150–400)
RBC: 2.87 MIL/uL — ABNORMAL LOW (ref 3.87–5.11)
RBC: 3.15 MIL/uL — ABNORMAL LOW (ref 3.87–5.11)
RBC: 3.19 MIL/uL — ABNORMAL LOW (ref 3.87–5.11)
RDW: 15.5 % (ref 11.5–15.5)
RDW: 15.5 % (ref 11.5–15.5)
RDW: 15.6 % — ABNORMAL HIGH (ref 11.5–15.5)
WBC: 6.2 10*3/uL (ref 4.0–10.5)
WBC: 6.9 10*3/uL (ref 4.0–10.5)
WBC: 7 10*3/uL (ref 4.0–10.5)
nRBC: 0 % (ref 0.0–0.2)
nRBC: 0 % (ref 0.0–0.2)
nRBC: 0 % (ref 0.0–0.2)

## 2020-02-12 LAB — PREPARE RBC (CROSSMATCH)

## 2020-02-12 MED ORDER — DIPHENHYDRAMINE HCL 25 MG PO CAPS
25.0000 mg | ORAL_CAPSULE | Freq: Once | ORAL | Status: AC
  Administered 2020-02-12: 25 mg via ORAL
  Filled 2020-02-12: qty 1

## 2020-02-12 MED ORDER — SODIUM CHLORIDE 0.9% IV SOLUTION: Freq: Once | INTRAVENOUS | Status: AC

## 2020-02-12 NOTE — Plan of Care (Signed)
Alert and oriented. Assessment and VS WNL. Denies c/o. Appears comfortable and in NAD. Appears to be Bonding well with Infant. H&H improved after receiving 1U PRBC last night. Lab will draw 2100 H&H as per order to reassess levels. Pt. Denies syncope or any other c/o. Color sl. Pale, skin warm and dry. Poss. Pedal pulses equal and strong with cap. Refill < 2 sec.

## 2020-02-12 NOTE — Anesthesia Post-op Follow-up Note (Signed)
  Anesthesia Pain Follow-up Note  Patient: Nicole Hartman  Day #: 1  Date of Follow-up: 02/12/2020 Time: 1:44 PM  Last Vitals:  Vitals:   02/12/20 0900 02/12/20 1139  BP:  124/63  Pulse: 74   Resp:  18  Temp:  36.6 C  SpO2: 100%     Level of Consciousness: alert  Pain: none   Side Effects:None  Catheter Site Exam:clean, dry, no drainage     Plan: D/C from anesthesia care at surgeon's request  Danyetta Gillham

## 2020-02-12 NOTE — Lactation Note (Signed)
This note was copied from a baby's chart. Lactation Consultation Note  Patient Name: [name] [id number] Date: 02/12/2020 Reason for consult: Follow-up assessment;Term;Other (Comment)(Noah is A+ with + coombs)  Nicole Hartman is cluster feeding.  Nicole Hartman is A+ with (+) coombs.  Praised and encouraged mom to keep putting him to the breast whenever he demonstrated feeding cues to prevent jaundice.  Mom needed assistance earlier when putting him to the breast, but is breast feeding independently now.  Nicole Hartman opens wide with flanged lips and latches well with strong, rhythmic sucking with audible swallows.  Mom breast fed 1st baby for 2 months.  She did not feel like she was producing enough milk and baby started losing.  Mom has a Lansinoh pump at home and was asking questions about when to pump and introduce bottle which were answered.  Hand out given on what to expect with breastfeeding her baby the 1st 4 days of life discussing normal newborn stomach size, supply and demand, adequate output, normal course of lactation and routine newborn feeding patterns.  Lactation community resource hand out with contact numbers given and reviewed and lactation name and number written on white board and encouraged to call with any questions, concerns or assistance.   Maternal Data Formula Feeding for Exclusion: No Has patient been taught Hand Expression?: Yes Does the patient have breastfeeding experience prior to this delivery?: Yes  Feeding Feeding Type: Breast Fed  LATCH Score Latch: Grasps breast easily, tongue down, lips flanged, rhythmical sucking.  Audible Swallowing: A few with stimulation  Type of Nipple: Everted at rest and after stimulation  Comfort (Breast/Nipple): Filling, red/small blisters or bruises, mild/mod discomfort  Hold (Positioning): No assistance needed to correctly position infant at breast.  LATCH Score: 8  Interventions Interventions: Breast massage;Adjust position;Support  pillows;Position options  Lactation Tools Discussed/Used WIC Program: Yes   Consult Status Consult Status: PRN Follow-up type: Call as needed    Louis Meckel 02/12/2020, 7:08 PM

## 2020-02-12 NOTE — Progress Notes (Signed)
Obstetric Postpartum/PostOperative Daily Progress Note Subjective:  24 y.o. [id number] post-operative day # 1 status post repeat cesarean delivery.  She has not yet ambulated since removal of her Foley catheter this morning at 8-9 AM, is tolerating po, has not yet voided since removal of her cathter (~2 hours ago) .  Her pain is well controlled on PO pain medications. Her lochia is less than menses. She is breastfeeding.    Medications SCHEDULED MEDICATIONS  . acetaminophen  650 mg Oral Q6H  . ferrous sulfate  325 mg Oral BID WC  . ibuprofen  600 mg Oral Q6H  . ketorolac  30 mg Intravenous Q6H   Or  . ketorolac  30 mg Intramuscular Q6H  . prenatal vitamin w/FE, FA  1 tablet Oral Daily  . senna-docusate  2 tablet Oral Q24H  . simethicone  80 mg Oral TID PC    MEDICATION INFUSIONS  . lactated ringers    . naLOXone (NARCAN) adult infusion for PRURITIS      PRN MEDICATIONS  albuterol, coconut oil, witch hazel-glycerin **AND** dibucaine, diphenhydrAMINE, diphenhydrAMINE **OR** diphenhydrAMINE, famotidine, measles, mumps & rubella vaccine, menthol-cetylpyridinium, nalbuphine **OR** nalbuphine, nalbuphine **OR** nalbuphine, naloxone **AND** sodium chloride flush, naLOXone (NARCAN) adult infusion for PRURITIS, ondansetron (ZOFRAN) IV, oxyCODONE-acetaminophen **OR** oxyCODONE-acetaminophen    Objective:   Vitals:   02/12/20 0530 02/12/20 0625 02/12/20 0800 02/12/20 0900  BP:  106/68 111/63   Pulse: 60 71  74  Resp:  17 18   Temp:  98 F (36.7 C) 97.9 F (36.6 C)   TempSrc:  Oral Oral   SpO2: 99% 99%  100%  Weight:      Height:        Current Vital Signs 24h Vital Sign Ranges  T 97.9 F (36.6 C) Temp  Avg: 97.9 F (36.6 C)  Min: 97.6 F (36.4 C)  Max: 98.5 F (36.9 C)  BP 111/63 BP  Min: 102/62  Max: 126/70  HR 74 Pulse  Avg: 73.1  Min: 60  Max: 98  RR 18 Resp  Avg: 17  Min: 11  Max: 26  SaO2 100 % (room air) SpO2  Avg: 98.8 %  Min: 94 %  Max: 100 %       24 Hour I/O Current  Shift I/O  Time Ins Outs 04/30 0701 - 05/01 0700 In: 2138.8 [I.V.:1684.6] Out: 1820 [Urine:1150] 05/01 0701 - 05/01 1900 In: -  Out: 500 [Urine:500]  General: NAD Pulmonary: no increased work of breathing Abdomen: non-distended, non-tender, fundus firm at level of umbilicus Inc: Clean/dry/intact Extremities: no edema, no erythema, no tenderness  Labs:  Recent Labs  Lab 02/09/20 0805 02/11/20 2350 02/12/20 0649  WBC 5.7 7.0 6.2  HGB 8.5* 6.8* 7.7*  HCT 28.2* 22.8* 25.5*  PLT 226 182 170     Assessment:   24 y.o. G2P2002 postoperative day # 1 status post repeat cesarean section, acute blood loss anemia due to surgery on chronic anemia due to iron deficiency.  Stable overall.   Plan:  1) Acute blood loss anemia - hemodynamically stable and asymptomatic - po ferrous sulfate - s/p 1 unit pRBC last night.  Appropriate rise in hgb/hct - recheck CBC this PM  2) O POS / Rubella <0.90 (09/18 1411) - MMR prior to discharge ordered/ Varicella Immune  3) TDAP status 12/24/2019, Flu vaccine: 07/08/2019  4) breast feeeding /Contraception = undecided  5) Disposition: home POD #2-3  Will Bonnet, MD, [place] 02/12/2020 11:07 AM

## 2020-02-12 NOTE — Progress Notes (Signed)
   Subjective:  Called regarding Hgb of 6.8, in to speak with patient regarding transfusion  Objective:  Vital signs in last 24 hours: Temp:  [97.6 F (36.4 C)-98.5 F (36.9 C)] 98.5 F (36.9 C) (05/01 0010) Pulse Rate:  [63-98] 65 (05/01 0100) Resp:  [11-26] 20 (05/01 0010) BP: (102-126)/(65-82) 102/65 (05/01 0010) SpO2:  [94 %-100 %] 98 % (05/01 0100) Weight:  [70.8 kg] 70.8 kg (04/30 1022)    Intake/Output      04/30 0701 - 05/01 0700   I.V. (mL/kg) 890.6 (12.6)   IV Piggyback 100   Total Intake(mL/kg) 990.6 (14)   Urine (mL/kg/hr) 750   Blood 670   Total Output 1420   Net -429.4        General: NAD Pulmonary: no increased work of breathing Extremities: no edema, no erythema, no tenderness  Results for orders placed or performed during the hospital encounter of 02/11/20 (from the past 72 hour(s))  Prepare RBC (crossmatch)     Status: None   Collection Time: 02/11/20 10:30 AM  Result Value Ref Range   Order Confirmation      ORDER PROCESSED BY BLOOD BANK C/ CARMEN JOHNSON ON 02/11/20 AT 1111 BY [facility] Performed at [facility], [address] [place], [place] [address]   CBC     Status: Abnormal   Collection Time: 02/11/20 11:50 PM  Result Value Ref Range   WBC 7.0 4.0 - 10.5 K/uL   RBC 2.87 (L) 3.87 - 5.11 MIL/uL   Hemoglobin 6.8 (L) 12.0 - 15.0 g/dL   HCT 91.6 (L) 38.4 - 66.5 %   MCV 79.4 (L) 80.0 - 100.0 fL   MCH 23.7 (L) 26.0 - 34.0 pg   MCHC 29.8 (L) 30.0 - 36.0 g/dL   RDW 99.3 57.0 - 17.7 %   Platelets 182 150 - 400 K/uL    Comment: Immature Platelet Fraction may be clinically indicated, consider ordering this additional test [id number]    nRBC 0.0 0.0 - 0.2 %    Comment: Performed at [facility], [address], [place], [place] [address]    Immunization History  Administered Date(s) Administered  . Influenza,inj,Quad PF,6+ Mos 07/08/2019  . Tdap 12/24/2019    Assessment:   24 y.o. [id number] postoperativeday # 1  RLTCS   Plan:  ) Acute blood loss anemia - hemodynamically stable and asymptomatic - po ferrous sulfate - Hgb drop appropriate given starting hemoglobin and EBL intraoperatively, however, anticipate will trend lower with continued postpartum lochia.  Given below transfusion threshold will administer 1U of pRBC  2) Blood Type --/--/O POS (04/28 0805) / Rubella <0.90 (09/18 1411) / Varicella Immune  3) TDAP status up to date  4) Feeding plan breast  5)  Disposition anticipate postoperative day 2-3 discharge  Vena Austria, MD, [facility], [facility] 02/12/2020, 1:30 AM

## 2020-02-12 NOTE — Anesthesia Postprocedure Evaluation (Signed)
Anesthesia Post Note  Patient: [name]  Procedure(s) Performed: CESAREAN SECTION (N/A )  Patient location during evaluation: Mother Baby Anesthesia Type: Spinal Level of consciousness: awake and alert and oriented Pain management: pain level controlled Vital Signs Assessment: post-procedure vital signs reviewed and stable Respiratory status: spontaneous breathing Cardiovascular status: blood pressure returned to baseline Postop Assessment: no headache and no backache Anesthetic complications: no     Last Vitals:  Vitals:   02/12/20 0900 02/12/20 1139  BP:  124/63  Pulse: 74   Resp:  18  Temp:  36.6 C  SpO2: 100%     Last Pain:  Vitals:   02/12/20 1139  TempSrc: Oral  PainSc:                  Anyelina Claycomb

## 2020-02-13 LAB — TYPE AND SCREEN
ABO/RH(D): O POS
Antibody Screen: NEGATIVE
Extend sample reason: UNDETERMINED
Unit division: 0
Unit division: 0

## 2020-02-13 LAB — BPAM RBC
Blood Product Expiration Date: 202106052359
Blood Product Expiration Date: 202106052359
ISSUE DATE / TIME: 202105010311
Unit Type and Rh: 5100
Unit Type and Rh: 5100

## 2020-02-13 NOTE — Progress Notes (Signed)
Obstetric Postpartum/PostOperative Daily Progress Note Subjective:  24 y.o. [id number] post-operative day # 2 status post repeat cesarean delivery.  She is ambulating, is tolerating po, is voiding spontaneously.  Her pain is well controlled on PO pain medications. Her lochia is less than menses.   Medications SCHEDULED MEDICATIONS  . ferrous sulfate  325 mg Oral BID WC  . ibuprofen  600 mg Oral Q6H  . prenatal vitamin w/FE, FA  1 tablet Oral Daily  . senna-docusate  2 tablet Oral Q24H  . simethicone  80 mg Oral TID PC    MEDICATION INFUSIONS  . lactated ringers    . naLOXone (NARCAN) adult infusion for PRURITIS      PRN MEDICATIONS  albuterol, coconut oil, witch hazel-glycerin **AND** dibucaine, diphenhydrAMINE, diphenhydrAMINE **OR** diphenhydrAMINE, famotidine, measles, mumps & rubella vaccine, menthol-cetylpyridinium, nalbuphine **OR** nalbuphine, nalbuphine **OR** nalbuphine, naloxone **AND** sodium chloride flush, naLOXone (NARCAN) adult infusion for PRURITIS, ondansetron (ZOFRAN) IV, oxyCODONE-acetaminophen **OR** oxyCODONE-acetaminophen    Objective:   Vitals:   02/12/20 1654 02/12/20 1943 02/12/20 2329 02/13/20 0713  BP: 117/74 122/78 116/73 118/77  Pulse: 76 75 78 85  Resp: 18 20 20 18   Temp: 98.1 F (36.7 C) 98.3 F (36.8 C) 98.4 F (36.9 C) 98 F (36.7 C)  TempSrc: Oral Oral Oral   SpO2: 100% 99%  98%  Weight:      Height:        Current Vital Signs 24h Vital Sign Ranges  T 98 F (36.7 C) Temp  Avg: 98 F (36.7 C)  Min: 97.8 F (36.6 C)  Max: 98.4 F (36.9 C)  BP 118/77 BP  Min: 103/63  Max: 124/63  HR 85 Pulse  Avg: 76.8  Min: 62  Max: 91  RR 18 Resp  Avg: 18.5  Min: 18  Max: 20  SaO2 98 % Room Air SpO2  Avg: 99.2 %  Min: 98 %  Max: 100 %       24 Hour I/O Current Shift I/O  Time Ins Outs 05/01 0701 - 05/02 0700 In: -  Out: 500 [Urine:500] No intake/output data recorded.  General: NAD Pulmonary: no increased work of breathing Abdomen: non-distended,  non-tender, fundus firm at level of umbilicus Inc: Clean/dry/intact Extremities: no edema, no erythema, no tenderness  Labs:  Recent Labs  Lab 02/11/20 2350 02/12/20 0649 02/12/20 2219  WBC 7.0 6.2 6.9  HGB 6.8* 7.7* 7.8*  HCT 22.8* 25.5* 26.5*  PLT 182 170 196     Assessment:   24 y.o. G2P2002 postoperative day # 2 status post repeat cesarean section, acute blood loss anemia due to surgery on chronic anemia due to iron deficiency.  Stable overall.   Plan:  1) Acute blood loss anemia on chronic anemia- hemodynamically stable and asymptomatic - po ferrous sulfate - s/p 1 unit pRBC 2 nights ago.  Appropriate rise in hgb/hct and stable on recheck last night.   2) O POS / Rubella <0.90 (09/18 1411)/ Varicella Immune  3) TDAP status 12/24/2019, flu vaccine 07/08/2019  4) breast feeding /Contraception = oral progesterone-only contraceptive  5) Disposition  07/10/2019, MD, FACOG 02/13/2020 7:27 AM

## 2020-02-13 NOTE — Progress Notes (Signed)
Lab Tech here stating she has an order for a CBC on Pt. I do not see any standing orders for Pt. For a CBC. Dr. Jean Rosenthal paged and he stated he has not ordered any Labs for this evening. Lab [facility] [employer]. Informed.

## 2020-02-13 NOTE — Plan of Care (Signed)
Alert and oriented with pleasant affect. Color sl. Pale;skin w&d. BBS clear Assessment and VS wnl. She denies any c/o and demonstrates ability to preform self care. Sig. Other is attentive and at bedside. Appears comfortable and in NAD.

## 2020-02-14 MED ORDER — NORETHINDRONE 0.35 MG PO TABS: 1.0000 | ORAL_TABLET | Freq: Every day | ORAL | 11 refills | Status: DC

## 2020-02-14 MED ORDER — OXYCODONE HCL 5 MG PO TABS: 5.0000 mg | ORAL_TABLET | Freq: Four times a day (QID) | ORAL | 0 refills | Status: AC | PRN

## 2020-02-14 NOTE — Progress Notes (Signed)
Pt discharged with infant.  Discharge instructions, prescriptions and follow up appointment given to and reviewed with pt. Pt verbalized understanding. Escorted out by auxillary. 

## 2020-02-21 ENCOUNTER — Other Ambulatory Visit: Payer: Self-pay

## 2020-02-21 ENCOUNTER — Ambulatory Visit (INDEPENDENT_AMBULATORY_CARE_PROVIDER_SITE_OTHER): Payer: Medicaid Other | Admitting: Obstetrics and Gynecology

## 2020-02-21 ENCOUNTER — Encounter: Payer: Self-pay | Admitting: Obstetrics and Gynecology

## 2020-02-21 VITALS — BP 122/68 | Ht 60.0 in | Wt 143.0 lb

## 2020-02-21 DIAGNOSIS — Z09 Encounter for follow-up examination after completed treatment for conditions other than malignant neoplasm: Secondary | ICD-10-CM

## 2020-02-21 NOTE — Progress Notes (Signed)
   Postoperative Follow-up Patient presents post op from cesarean section  10 days ago.  Subjective: She denies fever, chills, nausea and vomiting. Eating a regular diet without difficulty. Pain is controlled with Tylenol and ibuprofen  Activity: increasing slowly. She denies issues with her incision.    Objective: BP 122/68   Ht 5' (1.524 m)   Wt 143 lb (64.9 kg)   BMI 27.93 kg/m   Constitutional: Well nourished, well developed female in no acute distress.  HEENT: normal Skin: Warm and dry.  Abdomen: s/nt/nd/ uterus at U-3 clean, dry, intact and without erythema, induration, warmth, and tenderness Extremity: no edema   Assessment: 24 y.o. s/p cesarean section progressing well  Plan: Patient has done well after surgery with no apparent complications.  I have discussed the post-operative course to date, and the expected progress moving forward.  The patient understands what complications to be concerned about.    Activity plan: increase slowly  Return in about 5 weeks (around 03/27/2020) for Six Week Postpartum.  Thomasene Mohair, MD 02/21/2020 12:26 PM

## 2020-03-27 ENCOUNTER — Ambulatory Visit: Payer: Medicaid Other | Admitting: Obstetrics and Gynecology

## 2020-04-05 ENCOUNTER — Other Ambulatory Visit: Payer: Self-pay

## 2020-04-05 ENCOUNTER — Ambulatory Visit (INDEPENDENT_AMBULATORY_CARE_PROVIDER_SITE_OTHER): Payer: Medicaid Other | Admitting: Obstetrics and Gynecology

## 2020-04-05 ENCOUNTER — Encounter: Payer: Self-pay | Admitting: Obstetrics and Gynecology

## 2020-04-05 ENCOUNTER — Other Ambulatory Visit (HOSPITAL_COMMUNITY)
Admission: RE | Admit: 2020-04-05 | Discharge: 2020-04-05 | Disposition: A | Discharge status: Home / Self Care | Payer: Medicaid Other | Source: Ambulatory Visit | Attending: Obstetrics and Gynecology | Admitting: Obstetrics and Gynecology

## 2020-04-05 VITALS — BP 109/70 | HR 67 | Wt 138.0 lb

## 2020-04-05 DIAGNOSIS — Z09 Encounter for follow-up examination after completed treatment for conditions other than malignant neoplasm: Secondary | ICD-10-CM | POA: Insufficient documentation

## 2020-04-05 DIAGNOSIS — Z124 Encounter for screening for malignant neoplasm of cervix: Secondary | ICD-10-CM | POA: Insufficient documentation

## 2020-04-05 DIAGNOSIS — Z113 Encounter for screening for infections with a predominantly sexual mode of transmission: Secondary | ICD-10-CM | POA: Diagnosis present

## 2020-04-05 NOTE — Progress Notes (Signed)
Postpartum Visit   Chief Complaint  Patient presents with  . Postpartum Care    C/S 4/30    History of Present Illness: Patient is a 24 y.o. [id number] presents for postpartum visit.  Date of delivery: 02/11/2020 Type of delivery: c-sectino Episiotomy No.  Laceration: not applicable  Pregnancy or labor problems:  no Any problems since the delivery:  Anemia as a result of blood loss during surgery.  S/p blood transfusion  Newborn Details:  SINGLETON :  1. Baby's name: Anette Riedel. Birth weight: 3,790 grams (8 lb 6 oz) Maternal Details:  Breast Feeding:  yes Post partum depression/anxiety noted:  no Edinburgh Post-Partum Depression Score:  5  Date of last PAP: unsure  Past Medical History:  Diagnosis Date  . Anemia   . Asthma    exercise induced  . Cesarean delivery delivered 03/02/2016  . Depression 2014  . GERD (gastroesophageal reflux disease)   . Headache    h/o migraines  . PTSD (post-traumatic stress disorder) 2014    Past Surgical History:  Procedure Laterality Date  . CESAREAN SECTION N/A 03/01/2016   Procedure: CESAREAN SECTION;  Surgeon: Conard Novak, MD;  Location: [facility] ORS;  Service: Obstetrics;  Laterality: N/A;  . CESAREAN SECTION N/A 02/11/2020   Procedure: CESAREAN SECTION;  Surgeon: Conard Novak, MD;  Location: [facility] ORS;  Service: Obstetrics;  Laterality: N/A;  RNFA    Prior to Admission medications   Medication Sig Start Date End Date Taking? Authorizing Provider  albuterol (PROAIR HFA) 108 (90 BASE) MCG/ACT inhaler Inhale 1 puff into the lungs 3 (three) times daily as needed for wheezing (for cough).   Yes [provider]  Prenatal Vit-Fe Fumarate-FA (PNV PRENATAL PLUS MULTIVITAMIN) 27-1 MG TABS Take 1 tablet by mouth daily.    Yes [provider]    No Known Allergies   Social History   Socioeconomic History  . Marital status: Married    Spouse name: Not on file  . Number of children: Not on file  . Years of education:  Not on file  . Highest education level: Not on file  Occupational History  . Not on file  Tobacco Use  . Smoking status: Never Smoker  . Smokeless tobacco: Never Used  Vaping Use  . Vaping Use: Never used  Substance and Sexual Activity  . Alcohol use: No  . Drug use: No  . Sexual activity: Yes    Birth control/protection: Pill  Other Topics Concern  . Not on file  Social History Narrative  . Not on file   Social Determinants of Health   Financial Resource Strain:   . Difficulty of Paying Living Expenses:   Food Insecurity:   . Worried About Programme researcher, broadcasting/film/video in the Last Year:   . Barista in the Last Year:   Transportation Needs:   . Freight forwarder (Medical):   Marland Kitchen Lack of Transportation (Non-Medical):   Physical Activity:   . Days of Exercise per Week:   . Minutes of Exercise per Session:   Stress:   . Feeling of Stress :   Social Connections:   . Frequency of Communication with Friends and Family:   . Frequency of Social Gatherings with Friends and Family:   . Attends Religious Services:   . Active Member of Clubs or Organizations:   . Attends Banker Meetings:   Marland Kitchen Marital Status:   Intimate Partner Violence:   . Fear of Current or Ex-Partner:   .  Emotionally Abused:   Marland Kitchen Physically Abused:   . Sexually Abused:     Family History  Problem Relation Age of Onset  . Breast cancer Maternal Aunt   . Cancer Maternal Grandfather     Review of Systems  Constitutional: Negative.   HENT: Negative.   Eyes: Negative.   Respiratory: Negative.   Cardiovascular: Negative.   Gastrointestinal: Negative.   Genitourinary: Negative.   Musculoskeletal: Negative.   Skin: Negative.   Neurological: Negative.   Psychiatric/Behavioral: Negative.      Physical Exam BP 109/70   Pulse 67   Wt 138 lb (62.6 kg)   Breastfeeding Yes   BMI 26.95 kg/m   Physical Exam Constitutional:      General: She is not in acute distress.    Appearance:  Normal appearance. She is well-developed.  Genitourinary:     Pelvic exam was performed with patient in the lithotomy position.     Vulva, inguinal canal, urethra, bladder, vagina, uterus, right adnexa and left adnexa normal.     No posterior fourchette tenderness, injury or lesion present.     No cervical friability, lesion, bleeding or polyp.  HENT:     Head: Normocephalic and atraumatic.  Eyes:     General: No scleral icterus.    Conjunctiva/sclera: Conjunctivae normal.  Cardiovascular:     Rate and Rhythm: Normal rate and regular rhythm.     Heart sounds: No murmur heard.  No friction rub. No gallop.   Pulmonary:     Effort: Pulmonary effort is normal. No respiratory distress.     Breath sounds: Normal breath sounds. No wheezing or rales.  Abdominal:     General: Bowel sounds are normal. There is no distension.     Palpations: Abdomen is soft. There is no mass.     Tenderness: There is no abdominal tenderness. There is no guarding or rebound.  Musculoskeletal:        General: Normal range of motion.     Cervical back: Normal range of motion and neck supple.  Neurological:     General: No focal deficit present.     Mental Status: She is alert and oriented to person, place, and time.     Cranial Nerves: No cranial nerve deficit.  Skin:    General: Skin is warm and dry.     Findings: No erythema.  Psychiatric:        Mood and Affect: Mood normal.        Behavior: Behavior normal.        Judgment: Judgment normal.      Female Chaperone present during breast and/or pelvic exam.  Assessment: 24 y.o. [id number] presenting for 6 week postpartum visit  Plan: Problem List Items Addressed This Visit    None    Visit Diagnoses    Postop check    -  Primary   Relevant Orders   Cytology - PAP   Screen for STD (sexually transmitted disease)       Relevant Orders   Cytology - PAP   Pap smear for cervical cancer screening       Relevant Orders   Cytology - PAP       1)  Contraception Education given regarding options for contraception, including oral contraceptives.  2)  Pap - ASCCP guidelines and rational discussed.  Patient opts for routine screening interval. She is due. Collected today.  3) Patient underwent screening for postpartum depression with no concerns noted.  4) Follow up 1  year for routine annual exam  Thomasene Mohair, MD 04/05/2020 4:56 PM

## 2020-04-10 LAB — CYTOLOGY - PAP
Chlamydia: NEGATIVE
Comment: NEGATIVE
Comment: NEGATIVE
Comment: NORMAL
Diagnosis: NEGATIVE
Neisseria Gonorrhea: NEGATIVE
Trichomonas: NEGATIVE

## 2020-04-24 ENCOUNTER — Telehealth: Payer: Self-pay

## 2020-04-24 NOTE — Telephone Encounter (Signed)
Pt calling; needs to return to work.  [place]; c/s incision was hit by a boogie board; is tender but nothing is coming out.  [phone]  Pt wants note to return to work on 7/15th; will p/u.  As far as incision adv it may be bruised from being hit from the boogie board and that makes it more tender; incision is closed; no sign of inf.  Adv to be seen if fever greater than 100 or signs around incision of inf.

## 2020-07-26 ENCOUNTER — Ambulatory Visit
Admission: EM | Admit: 2020-07-26 | Discharge: 2020-07-26 | Disposition: A | Discharge status: Home / Self Care | Payer: Medicaid Other

## 2020-07-26 ENCOUNTER — Other Ambulatory Visit: Payer: Self-pay

## 2020-10-22 ENCOUNTER — Other Ambulatory Visit: Payer: Medicaid Other

## 2020-10-22 DIAGNOSIS — Z20822 Contact with and (suspected) exposure to covid-19: Secondary | ICD-10-CM

## 2020-10-25 LAB — NOVEL CORONAVIRUS, NAA: SARS-CoV-2, NAA: DETECTED — AB

## 2021-02-13 ENCOUNTER — Telehealth: Payer: Self-pay

## 2021-02-13 NOTE — Telephone Encounter (Signed)
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  Also, every day for the past 2 wks she has had brown bleeding and cramping.  [phone]  Adv per Providers it is okay for her to work c nitrous oxide as long as there is a good ventilation system and she isn't directly inhaling it.  Adv brown blood means it's old; if starts to bleed like a period she should be seen; cramping is normal throughout the whole preg as long as it doesn't make her double over or stop her in her tracks.

## 2021-02-23 ENCOUNTER — Encounter: Payer: Self-pay | Admitting: Obstetrics and Gynecology

## 2021-02-23 ENCOUNTER — Ambulatory Visit (INDEPENDENT_AMBULATORY_CARE_PROVIDER_SITE_OTHER): Payer: Medicaid Other | Admitting: Obstetrics and Gynecology

## 2021-02-23 ENCOUNTER — Other Ambulatory Visit: Payer: Self-pay | Admitting: Obstetrics and Gynecology

## 2021-02-23 ENCOUNTER — Other Ambulatory Visit (HOSPITAL_COMMUNITY)
Admission: RE | Admit: 2021-02-23 | Discharge: 2021-02-23 | Disposition: A | Discharge status: Home / Self Care | Payer: Medicaid Other | Source: Ambulatory Visit | Attending: Obstetrics and Gynecology | Admitting: Obstetrics and Gynecology

## 2021-02-23 ENCOUNTER — Other Ambulatory Visit: Payer: Self-pay

## 2021-02-23 VITALS — BP 90/60 | Wt 130.0 lb

## 2021-02-23 DIAGNOSIS — O219 Vomiting of pregnancy, unspecified: Secondary | ICD-10-CM

## 2021-02-23 DIAGNOSIS — Z3A10 10 weeks gestation of pregnancy: Secondary | ICD-10-CM

## 2021-02-23 DIAGNOSIS — Z113 Encounter for screening for infections with a predominantly sexual mode of transmission: Secondary | ICD-10-CM | POA: Insufficient documentation

## 2021-02-23 DIAGNOSIS — O281 Abnormal biochemical finding on antenatal screening of mother: Secondary | ICD-10-CM | POA: Diagnosis not present

## 2021-02-23 DIAGNOSIS — Z3149 Encounter for other procreative investigation and testing: Secondary | ICD-10-CM

## 2021-02-23 DIAGNOSIS — Z3481 Encounter for supervision of other normal pregnancy, first trimester: Secondary | ICD-10-CM

## 2021-02-23 DIAGNOSIS — Z3482 Encounter for supervision of other normal pregnancy, second trimester: Secondary | ICD-10-CM | POA: Insufficient documentation

## 2021-02-23 DIAGNOSIS — Z7185 Encounter for immunization safety counseling: Secondary | ICD-10-CM | POA: Diagnosis not present

## 2021-02-23 LAB — POCT URINALYSIS DIPSTICK OB
Bilirubin, UA: NEGATIVE
Blood, UA: NEGATIVE
Glucose, UA: NEGATIVE
Ketones, UA: NEGATIVE
Nitrite, UA: NEGATIVE
Urobilinogen, UA: 0.2 E.U./dL
pH, UA: 5 (ref 5.0–8.0)

## 2021-02-23 LAB — POCT URINE PREGNANCY: Preg Test, Ur: POSITIVE — AB

## 2021-02-23 MED ORDER — DOXYLAMINE-PYRIDOXINE 10-10 MG PO TBEC
2.0000 | DELAYED_RELEASE_TABLET | Freq: Every day | ORAL | 5 refills | Status: DC
Start: 2021-02-23 — End: 2021-05-21

## 2021-02-23 NOTE — Progress Notes (Signed)
New Obstetric Patient H&P    Chief Complaint: + UPT at urgent care 3 weeks ago   History of Present Illness: Patient is a 25 y.o. G25P2002 Hispanic or Latino female, presents with amenorrhea and positive home pregnancy test. Patient's last menstrual period was 12/15/2020 (exact date). and based on her  LMP, her EDD is Estimated Date of Delivery: 09/21/21 and her EGA is [redacted]w[redacted]d. Cycles are 5-6 days.  irregular, and occur approximately every : 28 days - although she reports that twice in the last year she experienced cycles that lasted over 54 days. Her last pap smear was 03/2020 and was NILM.    She had a urine pregnancy test which was positive 3 week(s)  ago. Her last menstrual period was normal and lasted for  5 or 6 day(s). Since her LMP she claims she has experienced an increase in N/V, fatigue, and has had two syncopal episodes. She reports intermittent spotting but has not experienced any today. She was treated for a UTI 3 weeks ago with a resolution in her sx, but reports a persistent odor to her urine. Her past medical history is significant for depression/anxiety/PTSD. Her prior pregnancies are notable for CS x2 (G1 - arrest of descent; G2 RCS). Patient reports experiencing syncopal episodes during her previous pregnancy. She reports she has experiened this twice during this pregnancy - both times have been associated with overheating/feeling hot.  Since her LMP, she admits to the use of tobacco products  no She claims she has lost 3lb since that start of her pregnancy. She reports tolerating oral intake daily. There are cats in the home in the home  no She admits close contact with children on a regular basis  yes  She has had chicken pox in the past no She has had Tuberculosis exposures, symptoms, or previously tested positive for TB   no Current or past history of domestic violence. no  Genetic Screening/Teratology Counseling: (Includes patient, baby's father, or anyone in either family  with:)   1. Patient's age >/= 25 at [facility]  no 2. Thalassemia ([place], [place], Mediterranean, or Asian background): MCV<80  no 3. Neural tube defect (meningomyelocele, spina bifida, anencephaly)  no 4. Congenital heart defect  no  5. Down syndrome  no 6. Tay-Sachs (Jewish, [place]  no 7. Canavan's Disease  no 8. Sickle cell disease or trait (African)  no  9. Hemophilia or other blood disorders  no  10. Muscular dystrophy  no  11. Cystic fibrosis  no  12. Huntington's Chorea  no  13. Mental retardation/autism  no 14. Other inherited genetic or chromosomal disorder  no 15. Maternal metabolic disorder (DM, PKU, etc)  no 16. Patient or FOB with a child with a birth defect not listed above no  16a. Patient or FOB with a birth defect themselves no 17. Recurrent pregnancy loss, or stillbirth  no  18. Any medications since LMP other than prenatal vitamins (include vitamins, supplements, OTC meds, drugs, alcohol)  Yes - macrobid for UTI 19. Any other genetic/environmental exposure to discuss  no  Infection History:   1. Lives with someone with TB or TB exposed  no  2. Patient or partner has history of genital herpes  no 3. Rash or viral illness since LMP  no 4. History of STI (GC, CT, HPV, syphilis, HIV)  no 5. History of recent travel :  no  Other pertinent information:  Lives with husband, Minerva Areola, and two children. Works for Education officer, community.  Review of Systems:10 point review of systems negative unless otherwise noted in HPI  Past Medical History:  Patient Active Problem List   Diagnosis Date Noted  . Encounter for supervision of other normal pregnancy, first trimester 02/23/2021     Nursing Staff Provider  Office Location  [facility] Dating   LMP -  Language  English Anatomy US    Flu Vaccine   UTD Genetic Screen  NIPS:  desires  TDaP vaccine    Hgb A1C or  GTT Early : n/a Third trimester :   Rhogam     LAB RESULTS   Feeding Plan  breast Blood Type     Contraception  Antibody     Circumcision  Rubella    Pediatrician   RPR     Support Person  Minerva Areola - husband HBsAg     Prenatal Classes  HIV      Varicella @[email or web address] @   BTL Consent  n/a GBS  (For PCN allergy, check sensitivities)        VBAC Consent  desires R CS Pap  6/21 - NILM    Hgb Electro   normal adult hgb  Covid  Vax x2 CF      SMA            . Nausea/vomiting in pregnancy 02/23/2021  . History of cesarean delivery 02/11/2020  . Menorrhagia 04/11/2017    Due to nexplanon most likely. Plan to give estradiol 2mg  daily x 3 weeks, then have menstruation for a few months.  If periods stop occurring, can stop medication.  Otherwise, she may stay on the medication to control her bleeding.    04/13/2017 PTSD (post-traumatic stress disorder) 01/08/2013  . MDD (major depressive disorder), recurrent episode, severe (HCC) 01/08/2013    Past Surgical History:  Past Surgical History:  Procedure Laterality Date  . CESAREAN SECTION N/A 03/01/2016   Procedure: CESAREAN SECTION;  Surgeon: 01/10/2013, MD;  Location: [facility] ORS;  Service: Obstetrics;  Laterality: N/A;  . CESAREAN SECTION N/A 02/11/2020   Procedure: CESAREAN SECTION;  Surgeon: Conard Novak, MD;  Location: [facility] ORS;  Service: Obstetrics;  Laterality: N/A;  RNFA    Gynecologic History: Patient's last menstrual period was 12/15/2020 (exact date).  Obstetric History: Conard Novak  Family History:  Family History  Problem Relation Age of Onset  . Breast cancer Maternal Aunt        not sure of age  . Cancer - Colon Maternal Grandfather   . Breast cancer Paternal Grandmother        48s    Social History:  Social History   Socioeconomic History  . Marital status: Married    Spouse name: Not on file  . Number of children: Not on file  . Years of education: Not on file  . Highest education level: Not on file  Occupational History  . Not on file  Tobacco Use  . Smoking status: Never Smoker  . Smokeless tobacco: Never Used  Vaping  Use  . Vaping Use: Never used  Substance and Sexual Activity  . Alcohol use: No  . Drug use: No  . Sexual activity: Yes    Partners: Male    Birth control/protection: None  Other Topics Concern  . Not on file  Social History Narrative  . Not on file   Social Determinants of Health   Financial Resource Strain: Not on file  Food Insecurity: Not on file  Transportation Needs: Not on file  Physical Activity: Not  on file  Stress: Not on file  Social Connections: Not on file  Intimate Partner Violence: Not on file    Allergies:  No Known Allergies  Medications: Prior to Admission medications   Medication Sig Start Date End Date Taking? Authorizing Provider  albuterol (VENTOLIN HFA) 108 (90 Base) MCG/ACT inhaler Inhale 1 puff into the lungs 3 (three) times daily as needed for wheezing (for cough).   Yes [provider]  Doxylamine-Pyridoxine (DICLEGIS) 10-10 MG TBEC Take 2 tablets by mouth at bedtime. If symptoms persist, add one tablet in the morning and one in the afternoon 02/23/21  Yes Burdette Gergely, CNM  Ferrous Sulfate (IRON) 325 (65 Fe) MG TABS Take 1 tablet by mouth daily. 02/10/21  Yes [provider]  Prenatal Vit-Fe Fumarate-FA (PNV PRENATAL PLUS MULTIVITAMIN) 27-1 MG TABS Take 1 tablet by mouth daily.    Yes [provider]    Physical Exam Vitals: Blood pressure 90/60, weight 130 lb (59 kg), last menstrual period 12/15/2020, currently breastfeeding.  General: NAD HEENT: normocephalic, anicteric Thyroid: no enlargement, no palpable nodules Pulmonary: No increased work of breathing, CTAB Cardiovascular: RRR, distal pulses 2+ Abdomen: NABS, soft, non-tender, non-distended.  Umbilicus without lesions.  No hepatomegaly, splenomegaly or masses palpable. No evidence of hernia  Genitourinary:  External: Normal external female genitalia.  Normal urethral meatus, normal  Bartholin's and Skene's glands.    Vagina: Normal vaginal mucosa, no evidence  of prolapse.    Cervix: Grossly normal in appearance, no bleeding  Uterus: Enlarged - c/w 10 wk dates, mobile, normal contour.  No CMT  Adnexa: ovaries non-enlarged, no adnexal masses  Rectal: deferred Extremities: no edema, erythema, or tenderness Neurologic: Grossly intact Psychiatric: mood appropriate, affect full  Assessment: 25 y.o. G3P2002 at [redacted]w[redacted]d presenting to initiate prenatal care  Plan: 1) Avoid alcoholic beverages. 2) Patient encouraged not to smoke.  3) Discontinue the use of all non-medicinal drugs and chemicals.  4) Take prenatal vitamins daily.  5) Nutrition, food safety (fish, cheese advisories, and high nitrite foods) and exercise discussed. 6) Hospital and practice style discussed with cross coverage system.  7) Genetic Screening, such as with 1st Trimester Screening, cell free fetal DNA, AFP testing, and Ultrasound, as well as with amniocentesis and CVS as appropriate, is discussed with patient. At the conclusion of today's visit patient requested genetic testing - Inheritest today, NIPS after dating [place]  8) Diclegis for N/V - patient to f/u is sx persist  9) NOB labs/GC/CT/urine cx collected today   10) Discussed syncope and triggering events - patient will monitor for additional concerns and f/u as needed if sx persist  11) RTC in 2 weeks for ROB with MD and dating [place]   Zipporah Plants, CNM, MSN [facility], [facility] 02/23/2021, 4:36 PM

## 2021-02-27 LAB — RPR+RH+ABO+RUB AB+AB SCR+CB...
Antibody Screen: NEGATIVE
HIV Screen 4th Generation wRfx: NONREACTIVE
Hematocrit: 37.6 % (ref 34.0–46.6)
Hemoglobin: 12 g/dL (ref 11.1–15.9)
Hepatitis B Surface Ag: NEGATIVE
MCH: 26.9 pg (ref 26.6–33.0)
MCHC: 31.9 g/dL (ref 31.5–35.7)
MCV: 84 fL (ref 79–97)
Platelets: 187 10*3/uL (ref 150–450)
RBC: 4.46 x10E6/uL (ref 3.77–5.28)
RDW: 13.6 % (ref 11.7–15.4)
RPR Ser Ql: NONREACTIVE
Rh Factor: POSITIVE
Rubella Antibodies, IGG: 2.21 index (ref >0.99)
Varicella zoster IgG: 757 index (ref >165)
WBC: 6.5 10*3/uL (ref 3.4–10.8)

## 2021-02-27 LAB — CERVICOVAGINAL ANCILLARY ONLY
Chlamydia: NEGATIVE
Comment: NEGATIVE
Comment: NEGATIVE
Comment: NORMAL
Neisseria Gonorrhea: NEGATIVE
Trichomonas: NEGATIVE

## 2021-03-01 LAB — URINE CULTURE

## 2021-03-02 ENCOUNTER — Telehealth: Payer: Self-pay

## 2021-03-02 ENCOUNTER — Other Ambulatory Visit: Payer: Self-pay | Admitting: Obstetrics and Gynecology

## 2021-03-02 DIAGNOSIS — O2341 Unspecified infection of urinary tract in pregnancy, first trimester: Secondary | ICD-10-CM

## 2021-03-02 MED ORDER — AMOXICILLIN-POT CLAVULANATE 875-125 MG PO TABS: 1.0000 | ORAL_TABLET | Freq: Two times a day (BID) | ORAL | 0 refills | Status: DC

## 2021-03-02 NOTE — Telephone Encounter (Signed)
LMVM to notify rx sent. Advised to complete full rx and contact our office with any additional questions/concerns.

## 2021-03-02 NOTE — Telephone Encounter (Signed)
Patient had NOB 02/23/21. She was expecting abx rx, but it wasn't sent to pharmacy. She is clarifying if she misunderstood that a rx would be called in if the urine culture was positive. She has seen her urine culture results and is inquiring about treatment. [phone]

## 2021-03-05 LAB — INHERITEST CORE(CF97,SMA,FRAX)

## 2021-03-08 ENCOUNTER — Other Ambulatory Visit: Payer: Self-pay | Admitting: Obstetrics and Gynecology

## 2021-03-08 DIAGNOSIS — Z148 Genetic carrier of other disease: Secondary | ICD-10-CM

## 2021-03-08 NOTE — Progress Notes (Signed)
Spoke with patient via phone. Utilized two patient identifiers. Reviewed Inheritest result as at risk for SMA carrier. Referral placed for genetic counseling. Patient stated understanding. All questions answered.  Zipporah Plants, CNM [facility], [facility] 03/08/2021 2:41 PM

## 2021-03-09 ENCOUNTER — Other Ambulatory Visit: Payer: Self-pay

## 2021-03-09 ENCOUNTER — Ambulatory Visit (INDEPENDENT_AMBULATORY_CARE_PROVIDER_SITE_OTHER): Payer: Medicaid Other | Admitting: Obstetrics & Gynecology

## 2021-03-09 ENCOUNTER — Encounter: Payer: Self-pay | Admitting: Obstetrics & Gynecology

## 2021-03-09 VITALS — BP 100/60 | Wt 129.0 lb

## 2021-03-09 DIAGNOSIS — Z3491 Encounter for supervision of normal pregnancy, unspecified, first trimester: Secondary | ICD-10-CM | POA: Insufficient documentation

## 2021-03-09 DIAGNOSIS — Z1379 Encounter for other screening for genetic and chromosomal anomalies: Secondary | ICD-10-CM

## 2021-03-09 DIAGNOSIS — N926 Irregular menstruation, unspecified: Secondary | ICD-10-CM

## 2021-03-09 DIAGNOSIS — Z3481 Encounter for supervision of other normal pregnancy, first trimester: Secondary | ICD-10-CM

## 2021-03-09 DIAGNOSIS — Z3A12 12 weeks gestation of pregnancy: Secondary | ICD-10-CM

## 2021-03-09 DIAGNOSIS — Z98891 History of uterine scar from previous surgery: Secondary | ICD-10-CM

## 2021-03-09 NOTE — Patient Instructions (Signed)
Thank you for choosing [facility]. As part of our ongoing efforts to improve patient experience, we would appreciate your feedback. Please fill out the short survey that you will receive by mail or MyChart. Your opinion is important to us! -Dr Leelah Hanna  Second Trimester of Pregnancy  The second trimester of pregnancy is from week 13 through week 27. This is months 4 through 6 of pregnancy. The second trimester is often a time when you feel your best. Your body has adjusted to being pregnant, and you begin to feel better physically. During the second trimester:  Morning sickness has lessened or stopped completely.  You may have more energy.  You may have an increase in appetite. The second trimester is also a time when the unborn baby (fetus) is growing rapidly. At the end of the sixth month, the fetus may be up to 12 inches long and weigh about 1 pounds. You will likely begin to feel the baby move (quickening) between 16 and 20 weeks of pregnancy. Body changes during your second trimester Your body continues to go through many changes during your second trimester. The changes vary and generally return to normal after the baby is born. Physical changes  Your weight will continue to increase. You will notice your lower abdomen bulging out.  You may begin to get stretch marks on your hips, abdomen, and breasts.  Your breasts will continue to grow and to become tender.  Dark spots or blotches (chloasma or mask of pregnancy) may develop on your face.  A dark line from your belly button to the pubic area (linea nigra) may appear.  You may have changes in your hair. These can include thickening of your hair, rapid growth, and changes in texture. Some people also have hair loss during or after pregnancy, or hair that feels dry or thin. Health changes  You may develop headaches.  You may have heartburn.  You may develop constipation.  You may develop hemorrhoids or swollen, bulging  veins (varicose veins).  Your gums may bleed and may be sensitive to brushing and flossing.  You may urinate more often because the fetus is pressing on your bladder.  You may have back pain. This is caused by: ? Weight gain. ? Pregnancy hormones that are relaxing the joints in your pelvis. ? A shift in weight and the muscles that support your balance. Follow these instructions at home: Medicines  Follow your health care provider's instructions regarding medicine use. Specific medicines may be either safe or unsafe to take during pregnancy. Do not take any medicines unless approved by your health care provider.  Take a prenatal vitamin that contains at least 600 micrograms (mcg) of folic acid. Eating and drinking  Eat a healthy diet that includes fresh fruits and vegetables, whole grains, good sources of protein such as meat, eggs, or tofu, and low-fat dairy products.  Avoid raw meat and unpasteurized juice, milk, and cheese. These carry germs that can harm you and your baby.  You may need to take these actions to prevent or treat constipation: ? Drink enough fluid to keep your urine pale yellow. ? Eat foods that are high in fiber, such as beans, whole grains, and fresh fruits and vegetables. ? Limit foods that are high in fat and processed sugars, such as fried or sweet foods. Activity  Exercise only as directed by your health care provider. Most people can continue their usual exercise routine during pregnancy. Try to exercise for 30 minutes at least   5 days a week. Stop exercising if you develop contractions in your uterus.  Stop exercising if you develop pain or cramping in the lower abdomen or lower back.  Avoid exercising if it is very hot or humid or if you are at a high altitude.  Avoid heavy lifting.  If you choose to, you may have sex unless your health care provider tells you not to. Relieving pain and discomfort  Wear a supportive bra to prevent discomfort from  breast tenderness.  Take warm sitz baths to soothe any pain or discomfort caused by hemorrhoids. Use hemorrhoid cream if your health care provider approves.  Rest with your legs raised (elevated) if you have leg cramps or low back pain.  If you develop varicose veins: ? Wear support hose as told by your health care provider. ? Elevate your feet for 15 minutes, 3-4 times a day. ? Limit salt in your diet. Safety  Wear your seat belt at all times when driving or riding in a car.  Talk with your health care provider if someone is verbally or physically abusive to you. Lifestyle  Do not use hot tubs, steam rooms, or saunas.  Do not douche. Do not use tampons or scented sanitary pads.  Avoid cat litter boxes and soil used by cats. These carry germs that can cause birth defects in the baby and possibly loss of the fetus by miscarriage or stillbirth.  Do not use herbal remedies, alcohol, illegal drugs, or medicines that are not approved by your health care provider. Chemicals in these products can harm your baby.  Do not use any products that contain nicotine or tobacco, such as cigarettes, e-cigarettes, and chewing tobacco. If you need help quitting, ask your health care provider. General instructions  During a routine prenatal visit, your health care provider will do a physical exam and other tests. He or she will also discuss your overall health. Keep all follow-up visits. This is important.  Ask your health care provider for a referral to a local prenatal education class.  Ask for help if you have counseling or nutritional needs during pregnancy. Your health care provider can offer advice or refer you to specialists for help with various needs. Where to find more information  American Pregnancy Association: americanpregnancy.org  American College of Obstetricians and Gynecologists: acog.org/en/Womens%20Health/Pregnancy  Office on Women's Health: womenshealth.gov/pregnancy Contact  a health care provider if you have:  A headache that does not go away when you take medicine.  Vision changes or you see spots in front of your eyes.  Mild pelvic cramps, pelvic pressure, or nagging pain in the abdominal area.  Persistent nausea, vomiting, or diarrhea.  A bad-smelling vaginal discharge or foul-smelling urine.  Pain when you urinate.  Sudden or extreme swelling of your face, hands, ankles, feet, or legs.  A fever. Get help right away if you:  Have fluid leaking from your vagina.  Have spotting or bleeding from your vagina.  Have severe abdominal cramping or pain.  Have difficulty breathing.  Have chest pain.  Have fainting spells.  Have not felt your baby move for the time period told by your health care provider.  Have new or increased pain, swelling, or redness in an arm or leg. Summary  The second trimester of pregnancy is from week 13 through week 27 (months 4 through 6).  Do not use herbal remedies, alcohol, illegal drugs, or medicines that are not approved by your health care provider. Chemicals in these products can   harm your baby.  Exercise only as directed by your health care provider. Most people can continue their usual exercise routine during pregnancy.  Keep all follow-up visits. This is important. This information is not intended to replace advice given to you by your health care provider. Make sure you discuss any questions you have with your health care provider. Document Revised: 03/08/2020 Document Reviewed: 01/13/2020 Elsevier Patient Education  2021 Elsevier Inc.  

## 2021-03-09 NOTE — Progress Notes (Signed)
ULTRASOUND REPORT  Location: [facility] Date of Service: 03/09/2021   Indications:dating Findings:  Mason Jim intrauterine pregnancy is visualized with a CRL consistent with [redacted]w[redacted]d gestation, giving an (U/S) EDD of 09/21/2021. The (U/S) EDD is consistent with the clinically established EDD of 09/21/2021.  FHR: 150 BPM CRL measurement: 53 mm Yolk sac is not visualized. Amnion: visualized and appears normal   Right Ovary is normal in appearance. Left Ovary is normal appearance. Corpus luteal cyst:  is not visualized Survey of the adnexa demonstrates no adnexal masses. There is no free peritoneal fluid in the cul de sac.  Impression: 1. [redacted]w[redacted]d Viable Singleton Intrauterine pregnancy by U/S. 2. (U/S) EDD is consistent with Clinically established EDD of 09/21/2021.  Recommendations: 1.Clinical correlation with the patient's History and Physical Exam.   Letitia Libra, MD

## 2021-03-09 NOTE — Progress Notes (Signed)
  Subjective  Nausea? yes    Diclegis no help    Mild No pain or bleeding  Objective  BP 100/60   Wt 129 lb (58.5 kg)   LMP 12/15/2020 (Exact Date)   BMI 25.19 kg/m  General: NAD Pumonary: no increased work of breathing Abdomen: gravid, non-tender Extremities: no edema Psychiatric: mood appropriate, affect full See [place] report  Assessment  25 y.o. Q4B2010 at [redacted]w[redacted]d by  09/21/2021, by Last Menstrual Period presenting for routine prenatal visit  Plan   Problem List Items Addressed This Visit      Other   History of 2 cesarean sections   Supervision of low-risk pregnancy, first trimester - Primary    Other Visit Diagnoses    [redacted] weeks gestation of pregnancy       Encounter for genetic screening for Down Syndrome       Relevant Orders   MaterniT21 PLUS Core+SCA      THIRD Problems (from 02/23/21 to present)    Problem Noted Resolved   Encounter for supervision of other normal pregnancy, first trimester 02/23/2021 by Zipporah Plants, CNM No   Overview Signed 02/23/2021  4:36 PM by Zipporah Plants, CNM     Nursing Staff Provider  Office Location  [facility] Dating   LMP -  Language  English Anatomy US    Flu Vaccine   UTD Genetic Screen  NIPS:  desires  TDaP vaccine    Hgb A1C or  GTT Early : n/a Third trimester :   Rhogam     LAB RESULTS   Feeding Plan  breast Blood Type     Contraception  Antibody    Circumcision  Rubella    Pediatrician   RPR     Support Person  Nicole Hartman - husband HBsAg     Prenatal Classes  HIV      Varicella @[email or web address] @   BTL Consent  n/a GBS  (For PCN allergy, check sensitivities)        VBAC Consent  desires R CS Pap  6/21 - NILM    Hgb Electro   normal adult hgb  Covid  Vax x2 CF      SMA                   7/21, MD, [facility], [facility] 03/09/2021  9:16 AM

## 2021-03-16 LAB — MATERNIT21 PLUS CORE+SCA
Fetal Fraction: 10
Monosomy X (Turner Syndrome): NOT DETECTED
Result (T21): NEGATIVE
Trisomy 13 (Patau syndrome): NEGATIVE
Trisomy 18 (Edwards syndrome): NEGATIVE
Trisomy 21 (Down syndrome): NEGATIVE
XXX (Triple X Syndrome): NOT DETECTED
XXY (Klinefelter Syndrome): NOT DETECTED
XYY (Jacobs Syndrome): NOT DETECTED

## 2021-03-20 ENCOUNTER — Ambulatory Visit: Payer: Medicaid Other | Attending: Obstetrics and Gynecology

## 2021-03-20 ENCOUNTER — Other Ambulatory Visit: Payer: Self-pay

## 2021-03-20 DIAGNOSIS — Z1371 Encounter for nonprocreative screening for genetic disease carrier status: Secondary | ICD-10-CM | POA: Diagnosis not present

## 2021-03-20 NOTE — Progress Notes (Signed)
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  We reviewed those results as well as additional screening and testing options for this pregnancy. The patient received counseling by telephone consultation and did not have anyone with her on the call.  SMA is a recessive genetic condition with variable age of onset and severity caused by mutations in the SMN1 gene.  To review, a recessive condition occurs in a child when both the mother and the father are carriers of the genetic condition and both pass on that altered gene to the child.  The features of SMA are caused by the loss of motor neurons which leads to progressive muscle weakness and muscle wasting (atrophy) in infancy.  There are several types of SMA based upon severity, but in the most common type (type 1), children may pass away as early as 54 years of age.  Carrier testing assesses the number of copies of the SMN1 gene, from zero to four.  Persons with one copy of the SMN1 gene are carriers, and those with no copies are affected with the condition.  Individuals with two or more copies have a reduced chance to be a carrier.  Persons with 3 or 4 copies of this gene are highly unlikely to be carriers.  However, someone with two copies of the gene can either have two copies of this gene on separate chromosomes and thus not be a carrier or they could have two copies of this gene on the same chromosome with no copies on the other chromosome and be a "silent" carrier.  This silent carrier status is known to be more common in persons of African American or Ashkenazi Jewish ancestry, particularly when they are found to be positive for a variant in the gene known as c. *3+80T>G. 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  This testing cannot confirm or eliminate the chance to have a child with SMA. It is estimated that current carrier screening can detect 94.8% of carriers in the Caucasian population and 92.6% of carriers in the Hispanic population.    Then next option we reviewed is to have her partner tested to determine his status.  If he is found to be a carrier, then the pregnancy may be at increased risk for SMA and testing would be offered during the pregnancy through amniocentesis or at the time of birth. At this time, prior to testing, Nicole Hartman has a chance of 1 in 63 to be a carrier given his Hispanic ancestry and no known family history of SMA.  Overall, the chance for this couple to have a child with SMA at this time is 1/68 X 1/140 X = 1 in 38,080. If he were to have testing that showed a low chance for him to be a carrier, this number could be decreased significantly.  If his testing were to show that he is a carrier, then the number would be increased. The patient indicated that she would like for Nicole Hartman to have testing and that he has insurance with [employer].  She plans to speak with him and contact our clinic about a date to schedule a lab only  visit.  We also talked about the possibility of testing at the time of delivery. Beginning in 2021, all newborn babies in [place] are test for SMA as well as numerous other conditions as part of state mandated newborn screening. Testing on cord blood or of the baby sometime after delivery could also be ordered if desired.  Lastly, we reviewed the results of the other testing performed by her OB for screening.  The results of the CF and Fragile X screening were negative, thus greatly reducing the chance to have a child with either of these conditions.  MaterniT21 testing was ordered by [facility] and was negative. AFP only screening was discussed as a screening option for open  neural tube defects.  We also obtained a detailed family history and pregnancy history. This is the third pregnancy for this patient, all with her current partner, Nicole Hartman.  The patient reported no complications or exposures to alcohol, tobacco, recreational drugs or medications in this pregnancy that would be expected to increase the risk for birth defects. They have a 93 year old son who is in good health and a 98 year old son with asthma and possible delays in speech and fine motor skills.  He has been referred to the [facility] program at [facility] and she is currently awaiting an appointment for evaluation.  Ms. Vanblarcom also stated that her brother had learning problems in school, but he is now an independent adult recently retired from [employer]. She has one maternal first cousin in a wheelchair since the age of 2 years.  He has not shown progressive muscle weakness but has had physical disabilities since that young age.  The specific diagnosis is not known.  We reviewed that there may be various reasons for physical disabilities including spina bifida, cerebral palsy or other conditions.  If more information is learned, we are happy to review medical records and discuss this further.  Lastly, Nicole Hartman has a sister with four children.  Two of her sons have possible delays including being nonverbal and her 81 year old daughter has vision loss and intellectual delays (equivalent to a 24 year old).  Again, there may be many reasons for conditions involving vision loss and developmental delays.  We would need additional medical information to provide an accurate risk assessment for this pregnancy.  Given the developmental delays and possible autistic features, we did discuss carrier screening for Fragile X, which was already done and was negative for Ms. Haberkorn. The remainder of the family history was unremarkable for birth defects, intellectual disabilities, muscle weakness conditions, early death or known genetic  conditions.  Thank you for allowing [place] to be involved in the care of this patient.  We encouraged her to call with any questions or concerns as they arise.  We may be reached at [phone].  Plan of care:  Marland Kitchen Patient and FOB to determine if they desire his testing. If so, she will call our clinic to schedule lab only visit. . If they decide not to have FOB tested due to cost, then patient plans to follow up with newborn screening results at the time of delivery. . Patient was encouraged to contact us with additional family history information if available.  Nicole Anderson, MS, CGC  I connected with  Nicole Hartman on 03/20/21 by a video enabled telemedicine application and verified that I am speaking with the correct person using two identifiers.   I discussed the limitations of evaluation and management by  telemedicine. The patient expressed understanding and agreed to proceed.

## 2021-03-22 ENCOUNTER — Ambulatory Visit: Payer: Medicaid Other

## 2021-04-06 ENCOUNTER — Encounter: Payer: Self-pay | Admitting: Obstetrics and Gynecology

## 2021-04-06 ENCOUNTER — Other Ambulatory Visit: Payer: Self-pay

## 2021-04-06 ENCOUNTER — Ambulatory Visit (INDEPENDENT_AMBULATORY_CARE_PROVIDER_SITE_OTHER): Payer: Medicaid Other | Admitting: Obstetrics and Gynecology

## 2021-04-06 VITALS — BP 100/70 | Ht 60.0 in | Wt 132.8 lb

## 2021-04-06 DIAGNOSIS — R300 Dysuria: Secondary | ICD-10-CM

## 2021-04-06 DIAGNOSIS — Z3A16 16 weeks gestation of pregnancy: Secondary | ICD-10-CM

## 2021-04-06 DIAGNOSIS — Z3481 Encounter for supervision of other normal pregnancy, first trimester: Secondary | ICD-10-CM

## 2021-04-06 DIAGNOSIS — O219 Vomiting of pregnancy, unspecified: Secondary | ICD-10-CM

## 2021-04-06 LAB — POCT URINALYSIS DIPSTICK OB
Glucose, UA: NEGATIVE
POC,PROTEIN,UA: NEGATIVE

## 2021-04-06 LAB — POCT URINALYSIS DIPSTICK
Bilirubin, UA: NEGATIVE
Glucose, UA: NEGATIVE
Ketones, UA: NEGATIVE
Nitrite, UA: POSITIVE
Protein, UA: NEGATIVE
Spec Grav, UA: 1.025 (ref 1.010–1.025)
Urobilinogen, UA: 0.2 E.U./dL
pH, UA: 6.5 (ref 5.0–8.0)

## 2021-04-06 MED ORDER — NITROFURANTOIN MONOHYD MACRO 100 MG PO CAPS
100.0000 mg | ORAL_CAPSULE | Freq: Two times a day (BID) | ORAL | 1 refills | Status: DC
Start: 2021-04-06 — End: 2021-05-21

## 2021-04-06 MED ORDER — PROMETHAZINE HCL 25 MG PO TABS
25.0000 mg | ORAL_TABLET | Freq: Four times a day (QID) | ORAL | 3 refills | Status: DC | PRN
Start: 2021-04-06 — End: 2021-05-21

## 2021-04-06 MED ORDER — ONDANSETRON 4 MG PO TBDP: 4.0000 mg | ORAL_TABLET | Freq: Four times a day (QID) | ORAL | 3 refills | Status: DC | PRN

## 2021-04-06 NOTE — Patient Instructions (Signed)
Initial steps to help :   B6 (pyridoxine) 25 mg,  3-4 times a day- 200 mg a day total Unisom (doxylamine) 25 mg at bedtime **B6 and Unisom are available as a combination prescription medications called diclegis and bonjesta  B1 (thiamin)  50-100 mg 1-2 a day-  100 mg a day total  Continue prenatal vitamin with iron and thiamin. If it is not tolerated switch to 1 mg of folic acid.  Can add medication for gastric reflux if needed.  Subsequent steps to be added to B1, B6, and Unisom:  Antihistamine (one of the following medications) Dramamine      25-50 mg every 4-6 hours Benadryl      25-50 mg every 4-6 hours Meclizine      25 mg every 6 hours  2. Dopamine Antagonist (one of the following medications) Metoclopramide  (Reglan)  5-10 mg every 6-8 hours         PO Promethazine   (Phenergan)   12.5-25 mg every 4-6 hours      PO or rectal Prochlorperazine  (Compazine)  5-10 mg every 6-8 hours     25mg BID rectally   Subsequent steps if there has still not been improvement in symptoms:  3. Daily stool softner:  Colace 100 mg twice a day  4. Ondansetron  (Zofran)   4-8 mg every 6-8 hours  Hyperemesis Gravidarum Hyperemesis gravidarum is a severe form of nausea and vomiting that happens during pregnancy. Hyperemesis is worse than morning sickness. It may cause you to have nausea or vomiting all day for many days. It may keep you from eating and drinking enough food and liquids, which can lead to dehydration, malnutrition, and weight loss. Hyperemesis usually occurs during the first half (the first 20 weeks) of pregnancy. It often goes away once a woman is in her second half of pregnancy. However, sometimes hyperemesis continues through anentire pregnancy. What are the causes? The cause of this condition is not known. It may be associated with: Changes in hormones in the body during pregnancy. Changes in the gastrointestinal system. Genetic or inherited conditions. What are the signs or  symptoms? Symptoms of this condition include: Severe nausea and vomiting that does not go away. Problems keeping food down. Weight loss. Loss of body fluid (dehydration). Loss of appetite. You may have no desire to eat or you may not like the food you have previously enjoyed. How is this diagnosed? This condition may be diagnosed based on your medical history, your symptoms,and a physical exam. You may also have other tests, including: Blood tests. Urine tests. Blood pressure tests. Ultrasound to look for problems with the placenta or to check if you are pregnant with more than one baby. How is this treated? This condition is managed by controlling symptoms. This may include: Following an eating plan. This can help to lessen nausea and vomiting. Treatments that do not use medicine. These include acupressure bracelets, hypnosis, and eating or drinking foods or fluids that contain ginger, ginger ale, or ginger tea. Taking prescription medicine or over-the-counter medicine as told by your health care provider. Continuing to take prenatal vitamins. You may need to change what kind you take and when you take them. Follow your health care provider's instructions about prenatal vitamins. An eating plan and medicines are often used together to help control symptoms. If medicines do not help relieve nausea and vomiting, you may need to receivefluids through an IV at the hospital. Follow these instructions at home: To help   relieve your symptoms, listen to your body. Everyone is different and has different preferences. Find what works best for you. Here are some thingsyou can try to help relieve your symptoms: Meals and snacks  Eat 5-6 small meals daily instead of 3 large meals. Eating small meals and snacks can help you avoid an empty stomach. Before getting out of bed, eat a couple of crackers to avoid moving around on an empty stomach. Eat a protein-rich snack before bed. Examples include cheese  and crackers, or a peanut butter sandwich made with 1 slice of whole-wheat bread and 1 tsp (5 g) of peanut butter. Eat and drink slowly. Try eating starchy foods as these are usually tolerated well. Examples include cereal, toast, bread, potatoes, pasta, rice, and pretzels. Eat at least one serving of protein with your meals and snacks. Protein options include lean meats, poultry, seafood, beans, nuts, nut butters, eggs, cheese, and yogurt. Eat or suck on things that have ginger in them. It may help to relieve nausea. Add  tsp (0.44 g) ground ginger to hot tea, or choose ginger tea.  Fluids It is important to stay hydrated. Try to: Drink small amounts of fluids often. Drink fluids 30 minutes before or after a meal to help lessen the feeling of a full stomach. Drink 100% fruit juice or an electrolyte drink. An electrolyte drink contains sodium, potassium, and chloride. Drink fluids that are cold, clear, and carbonated or sour. These include lemonade, ginger ale, lemon-lime soda, ice water, and sparkling water. Things to avoid Avoid the following: Eating foods that trigger your symptoms. These may include spicy foods, coffee, high-fat foods, very sweet foods, and acidic foods. Drinking more than 1 cup of fluid at a time. Skipping meals. Nausea can be more intense on an empty stomach. If you cannot tolerate food, do not force it. Try sucking on ice chips or other frozen items and make up for missed calories later. Lying down within 2 hours after eating. Being exposed to environmental triggers. These may include food smells, smoky rooms, closed spaces, rooms with strong smells, warm or humid places, overly loud and noisy rooms, and rooms with motion or flickering lights. Try eating meals in a well-ventilated area that is free of strong smells. Making quick and sudden changes in your movement. Taking iron pills and multivitamins that contain iron. If you take prescription iron pills, do not stop  taking them unless your health care provider approves. Preparing food. The smell of food can spoil your appetite or trigger nausea. General instructions Brush your teeth or use a mouth rinse after meals. Take over-the-counter and prescription medicines only as told by your health care provider. Follow instructions from your health care provider about eating or drinking restrictions. Talk with your health care provider about starting a supplement of vitamin B6. Continue to take your prenatal vitamins as told by your health care provider. If you are having trouble taking your prenatal vitamins, talk with your health care provider about other options. Keep all follow-up visits. This is important. Follow-up visits include prenatal visits. Contact a health care provider if: You have pain in your abdomen. You have a severe headache. You have vision problems. You are losing weight. You feel weak or dizzy. You cannot eat or drink without vomiting, especially if this goes on for a full day. Get help right away if: You cannot drink fluids without vomiting. You vomit blood. You have constant nausea and vomiting. You are very weak. You faint. You have   a fever and your symptoms suddenly get worse. Summary Hyperemesis gravidarum is a severe form of nausea and vomiting that happens during pregnancy. Making some changes to your eating habits may help relieve nausea and vomiting. This condition may be managed with lifestyle changes and medicines as prescribed by your health care provider. If medicines do not help relieve nausea and vomiting, you may need to receive fluids through an IV at the hospital. This information is not intended to replace advice given to you by your health care provider. Make sure you discuss any questions you have with your healthcare provider. Document Revised: 04/24/2020 Document Reviewed: 04/24/2020 Elsevier Patient Education  2022 Elsevier Inc.  

## 2021-04-06 NOTE — Progress Notes (Signed)
Routine Prenatal Care Visit  Subjective  Nicole Hartman is a 25 y.o. G3P2002 at [redacted]w[redacted]d being seen today for ongoing prenatal care.  She is currently monitored for the following issues for this low-risk pregnancy and has PTSD (post-traumatic stress disorder); MDD (major depressive disorder), recurrent episode, severe (HCC); Menorrhagia; History of 2 cesarean sections; Encounter for supervision of other normal pregnancy, first trimester; Nausea/vomiting in pregnancy; and Supervision of low-risk pregnancy, first trimester on their problem list.  ----------------------------------------------------------------------------------- Patient reports  nausea and vomiting. Only keeping one meal a day down. Not tolerating water. Tried B vitamins without improvement. She also has noticed a return of a foul odor to her urine .   Contractions: Not present. Vag. Bleeding: None.  Movement: Absent. Denies leaking of fluid.  ----------------------------------------------------------------------------------- The following portions of the patient's history were reviewed and updated as appropriate: allergies, current medications, past family history, past medical history, past social history, past surgical history and problem list. Problem list updated.   Objective  Blood pressure 100/70, height 5' (1.524 m), weight 132 lb 12.8 oz (60.2 kg), last menstrual period 12/15/2020, currently breastfeeding. Pregravid weight 133 lb (60.3 kg) Total Weight Gain -3.2 oz (-0.091 kg) Urinalysis:      Fetal Status: Fetal Heart Rate (bpm): 150   Movement: Absent     General:  Alert, oriented and cooperative. Patient is in no acute distress.  Skin: Skin is warm and dry. No rash noted.   Cardiovascular: Normal heart rate noted  Respiratory: Normal respiratory effort, no problems with respiration noted  Abdomen: Soft, gravid, appropriate for gestational age. Pain/Pressure: Absent     Pelvic:  Cervical exam deferred         Extremities: Normal range of motion.  Edema: None  Mental Status: Normal mood and affect. Normal behavior. Normal judgment and thought content.     Assessment   25 y.o. [id number] at [redacted]w[redacted]d by  09/21/2021, by Last Menstrual Period presenting for routine prenatal visit  Plan   THIRD Problems (from 02/23/21 to present)     Problem Noted Resolved   Encounter for supervision of other normal pregnancy, first trimester 02/23/2021 by Zipporah Plants, CNM No   Overview Addendum 03/09/2021  9:19 AM by Nadara Mustard, MD     Nursing Staff Provider  Office Location  [facility] Dating   LMP, [place] confirmed  Language  English Anatomy US    Flu Vaccine   UTD Genetic Screen  NIPS: 03/09/21  TDaP vaccine    Hgb A1C or  GTT Early : n/a Third trimester :   Rhogam     LAB RESULTS   Feeding Plan  breast Blood Type O/Positive/-- (05/13 1622)   Contraception  Antibody Negative (05/13 1622)  Circumcision  Rubella 2.21 (05/13 1622)  Pediatrician   RPR Non Reactive (05/13 1622)   Support Person  Minerva Areola - husband HBsAg Negative (05/13 1622)   Prenatal Classes  HIV Non Reactive (05/13 1622)    Varicella IMM   BTL Consent  n/a GBS  (For PCN allergy, check sensitivities)        VBAC Consent  desires R CS Pap  6/21 - NILM    Hgb Electro   normal adult hgb  Covid  Vax x2 CF      SMA                   Reviewed plan for hyperemesis- rx sent UA suggestive of infection- macrobid rx sent and urine culture.  Gestational age appropriate obstetric precautions including but not limited to vaginal bleeding, contractions, leaking of fluid and fetal movement were reviewed in detail with the patient.    Return in about 4 weeks (around 05/04/2021) for ROB in person and anatomy US.  Natale Milch MD [facility], [facility] 04/06/2021, 9:28 AM

## 2021-04-11 LAB — URINE CULTURE

## 2021-05-04 ENCOUNTER — Ambulatory Visit (INDEPENDENT_AMBULATORY_CARE_PROVIDER_SITE_OTHER): Payer: Medicaid Other | Admitting: Obstetrics & Gynecology

## 2021-05-04 ENCOUNTER — Other Ambulatory Visit: Payer: Self-pay

## 2021-05-04 ENCOUNTER — Encounter: Payer: Self-pay | Admitting: Obstetrics & Gynecology

## 2021-05-04 VITALS — BP 100/70 | Wt 138.0 lb

## 2021-05-04 DIAGNOSIS — Z98891 History of uterine scar from previous surgery: Secondary | ICD-10-CM

## 2021-05-04 DIAGNOSIS — Z3482 Encounter for supervision of other normal pregnancy, second trimester: Secondary | ICD-10-CM

## 2021-05-04 DIAGNOSIS — Z3A20 20 weeks gestation of pregnancy: Secondary | ICD-10-CM

## 2021-05-04 NOTE — Patient Instructions (Signed)

## 2021-05-04 NOTE — Progress Notes (Signed)
  Subjective  Fetal Movement? yes Contractions? no Leaking Fluid? no Vaginal Bleeding? no  Objective  BP 100/70   Wt 138 lb (62.6 kg)   LMP 12/15/2020 (Exact Date)   BMI 26.95 kg/m  General: NAD Pumonary: no increased work of breathing Abdomen: gravid, non-tender Extremities: no edema Psychiatric: mood appropriate, affect full  Assessment  25 y.o. G9E0100 at [redacted]w[redacted]d by  09/21/2021, by Last Menstrual Period presenting for routine prenatal visit  Plan   Problem List Items Addressed This Visit      Other   History of 2 cesarean sections   Encounter for supervision of other normal pregnancy, first trimester - Primary  Other Visit Diagnoses    [redacted] weeks gestation of pregnancy          THIRD Problems (from 02/23/21 to present)    Problem Noted Resolved   Encounter for supervision of other normal pregnancy, first trimester 02/23/2021 by Zipporah Plants, CNM No   Overview Addendum 04/06/2021  9:15 AM by Natale Milch, MD     Nursing Staff Provider  Office Location  [facility] Dating   LMP, [place] confirmed  Language  English Anatomy US    Flu Vaccine   UTD Genetic Screen  NIPS: 03/09/21  TDaP vaccine    Hgb A1C or  GTT Early : n/a Third trimester :   Rhogam  Not needed   LAB RESULTS   Feeding Plan  breast Blood Type O/Positive/-- (05/13 1622)   Contraception  Antibody Negative (05/13 1622)  Circumcision  Rubella 2.21 (05/13 1622)  Pediatrician   RPR Non Reactive (05/13 1622)   Support Person  Minerva Areola - husband HBsAg Negative (05/13 1622)   Prenatal Classes  HIV Non Reactive (05/13 1622)    Varicella IMM   BTL Consent  n/a GBS  (For PCN allergy, check sensitivities)        VBAC Consent  desires R CS Pap  6/21 - NILM    Hgb Electro   normal adult hgb  Covid  Vax x2 CF      SMA                [place] soon PNV Planning BTL or vasec   Annamarie Major, MD, [facility], [facility] 05/04/2021  4:12 PM

## 2021-05-16 ENCOUNTER — Other Ambulatory Visit: Payer: Self-pay

## 2021-05-16 ENCOUNTER — Ambulatory Visit
Admission: RE | Admit: 2021-05-16 | Discharge: 2021-05-16 | Disposition: A | Discharge status: Home / Self Care | Payer: Medicaid Other | Source: Ambulatory Visit | Attending: Obstetrics and Gynecology | Admitting: Obstetrics and Gynecology

## 2021-05-16 DIAGNOSIS — Z3481 Encounter for supervision of other normal pregnancy, first trimester: Secondary | ICD-10-CM | POA: Diagnosis not present

## 2021-05-17 ENCOUNTER — Encounter: Payer: Medicaid Other | Admitting: Obstetrics

## 2021-05-21 ENCOUNTER — Other Ambulatory Visit: Payer: Self-pay

## 2021-05-21 ENCOUNTER — Ambulatory Visit (INDEPENDENT_AMBULATORY_CARE_PROVIDER_SITE_OTHER): Payer: Medicaid Other | Admitting: Obstetrics

## 2021-05-21 VITALS — BP 120/60 | Wt 142.0 lb

## 2021-05-21 DIAGNOSIS — Z3A22 22 weeks gestation of pregnancy: Secondary | ICD-10-CM

## 2021-05-21 DIAGNOSIS — O0990 Supervision of high risk pregnancy, unspecified, unspecified trimester: Secondary | ICD-10-CM

## 2021-05-21 LAB — POCT URINALYSIS DIPSTICK OB
Glucose, UA: NEGATIVE
POC,PROTEIN,UA: NEGATIVE

## 2021-05-21 NOTE — Progress Notes (Signed)
ROB- no concerns 

## 2021-05-21 NOTE — Addendum Note (Signed)
Addended by: Donnetta Hail on: 05/21/2021 03:07 PM   Modules accepted: Orders

## 2021-05-21 NOTE — Progress Notes (Signed)
Routine Prenatal Care Visit  Subjective  Nicole Hartman is a 25 y.o. G3P2002 at [redacted]w[redacted]d being seen today for ongoing prenatal care.  She is currently monitored for the following issues for this low-risk pregnancy and has PTSD (post-traumatic stress disorder); MDD (major depressive disorder), recurrent episode, severe (HCC); Menorrhagia; History of 2 cesarean sections; Prenatal care, subsequent pregnancy, second trimester; and Nausea/vomiting in pregnancy on their problem list.  ----------------------------------------------------------------------------------- Patient reports no complaints.  She admits that she has never started on iron tablets. Previous H and H was   .  .   . Leaking Fluid denies.  ----------------------------------------------------------------------------------- The following portions of the patient's history were reviewed and updated as appropriate: allergies, current medications, past family history, past medical history, past social history, past surgical history and problem list. Problem list updated.  Objective  Blood pressure 120/60, weight 142 lb (64.4 kg), last menstrual period 12/15/2020, currently breastfeeding. Pregravid weight 133 lb (60.3 kg) Total Weight Gain 9 lb (4.082 kg) Urinalysis: Urine Protein    Urine Glucose    Fetal Status:           General:  Alert, oriented and cooperative. Patient is in no acute distress.  Skin: Skin is warm and dry. No rash noted.   Cardiovascular: Normal heart rate noted  Respiratory: Normal respiratory effort, no problems with respiration noted  Abdomen: Soft, gravid, appropriate for gestational age.       Pelvic:  Cervical exam deferred        Extremities: Normal range of motion.     Mental Status: Normal mood and affect. Normal behavior. Normal judgment and thought content.   Assessment   25 y.o. [id number] at [redacted]w[redacted]d by  09/21/2021, by Last Menstrual Period presenting for routine prenatal visit  Plan   THIRD Problems  (from 02/23/21 to present)    Problem Noted Resolved   Prenatal care, subsequent pregnancy, second trimester 02/23/2021 by Zipporah Plants, CNM No   Overview Addendum 05/04/2021  4:13 PM by Nadara Mustard, MD     Nursing Staff Provider  Office Location  [facility] Dating   LMP, [place] confirmed  Language  English Anatomy US    Flu Vaccine   UTD Genetic Screen  NIPS: 03/09/21  TDaP vaccine    Hgb A1C or  GTT Early : n/a Third trimester :   Rhogam  Not needed   LAB RESULTS   Feeding Plan  breast Blood Type O/Positive/-- (05/13 1622)   Contraception BTL or vasec Antibody Negative (05/13 1622)  Circumcision  Rubella 2.21 (05/13 1622)  Pediatrician   RPR Non Reactive (05/13 1622)   Support Person  Minerva Areola - husband HBsAg Negative (05/13 1622)   Prenatal Classes  HIV Non Reactive (05/13 1622)    Varicella IMM   BTL Consent  Needs GBS  (For PCN allergy, check sensitivities)        VBAC Consent  desires R CS Pap  6/21 - NILM    Hgb Electro   normal adult hgb  Covid  Vax x2 CF      SMA                   Preterm labor symptoms and general obstetric precautions including but not limited to vaginal bleeding, contractions, leaking of fluid and fetal movement were reviewed in detail with the patient. Please refer to After Visit Summary for other counseling recommendations.  Discussed dietary iron sources. Stressed import of taking iron. Will recheck her H and H at  28 weeks. Return in about 4 weeks (around 06/18/2021) for return OB.  Mirna Mires, CNM  05/21/2021 2:32 PM

## 2021-06-07 ENCOUNTER — Telehealth: Payer: Self-pay

## 2021-06-07 NOTE — Telephone Encounter (Signed)
Pt calling; has some concerns and needs to know if she needs to be seen sooner than her appt next Fri; her d/c is dark blood or light pink; has tightening in stomach not sure if B-H or not; has a sharp pain c a certain movement.  [phone]  [facility]

## 2021-06-07 NOTE — Telephone Encounter (Signed)
Pt returned call; is having true ctxs but only about 2 an hour;  has had this d/c all week; no IC before it started; to watch for d/c to become like a period; if that happens pt needs to be seen before appt; pt states good FM; as for the sharp pain now and then adv probably baby hitting a nerve just right.

## 2021-06-08 NOTE — Telephone Encounter (Signed)
Called pt to f/u with her from msg yesterday.  Pt states she is some better; she drank more water and the tightening stopped; d/c is still the same.

## 2021-06-14 DIAGNOSIS — R4589 Other symptoms and signs involving emotional state: Secondary | ICD-10-CM | POA: Insufficient documentation

## 2021-06-14 DIAGNOSIS — O26899 Other specified pregnancy related conditions, unspecified trimester: Secondary | ICD-10-CM | POA: Insufficient documentation

## 2021-06-14 DIAGNOSIS — R102 Pelvic and perineal pain: Secondary | ICD-10-CM | POA: Insufficient documentation

## 2021-06-14 DIAGNOSIS — O34219 Maternal care for unspecified type scar from previous cesarean delivery: Secondary | ICD-10-CM | POA: Insufficient documentation

## 2021-06-15 ENCOUNTER — Encounter: Payer: Self-pay | Admitting: Obstetrics and Gynecology

## 2021-06-15 ENCOUNTER — Other Ambulatory Visit: Payer: Self-pay

## 2021-06-15 ENCOUNTER — Ambulatory Visit (INDEPENDENT_AMBULATORY_CARE_PROVIDER_SITE_OTHER): Payer: Medicaid Other | Admitting: Obstetrics and Gynecology

## 2021-06-15 VITALS — BP 100/64 | Wt 144.0 lb

## 2021-06-15 DIAGNOSIS — Z3482 Encounter for supervision of other normal pregnancy, second trimester: Secondary | ICD-10-CM

## 2021-06-15 DIAGNOSIS — Z131 Encounter for screening for diabetes mellitus: Secondary | ICD-10-CM

## 2021-06-15 DIAGNOSIS — Z113 Encounter for screening for infections with a predominantly sexual mode of transmission: Secondary | ICD-10-CM

## 2021-06-15 DIAGNOSIS — Z3A26 26 weeks gestation of pregnancy: Secondary | ICD-10-CM

## 2021-06-15 DIAGNOSIS — Z98891 History of uterine scar from previous surgery: Secondary | ICD-10-CM

## 2021-06-15 LAB — POCT URINALYSIS DIPSTICK OB
Glucose, UA: NEGATIVE
POC,PROTEIN,UA: NEGATIVE

## 2021-06-15 NOTE — Progress Notes (Signed)
Routine Prenatal Care Visit  Subjective  Nicole Hartman is a 25 y.o. G3P2002 at [redacted]w[redacted]d being seen today for ongoing prenatal care.  She is currently monitored for the following issues for this low-risk pregnancy and has PTSD (post-traumatic stress disorder); MDD (major depressive disorder), recurrent episode, severe (HCC); Menorrhagia; History of 2 cesarean sections; Prenatal care, subsequent pregnancy, second trimester; and Nausea/vomiting in pregnancy on their problem list.  ----------------------------------------------------------------------------------- Patient reports no complaints.   Contractions: Not present. Vag. Bleeding: None.  Movement: Present. Leaking Fluid denies.  ----------------------------------------------------------------------------------- The following portions of the patient's history were reviewed and updated as appropriate: allergies, current medications, past family history, past medical history, past social history, past surgical history and problem list. Problem list updated.  Objective  Blood pressure 100/64, weight 144 lb (65.3 kg), last menstrual period 12/15/2020, currently breastfeeding. Pregravid weight 133 lb (60.3 kg) Total Weight Gain 11 lb (4.99 kg) Urinalysis: Urine Protein Negative  Urine Glucose Negative  Fetal Status: Fetal Heart Rate (bpm): 130 Fundal Height: 27 cm Movement: Present     General:  Alert, oriented and cooperative. Patient is in no acute distress.  Skin: Skin is warm and dry. No rash noted.   Cardiovascular: Normal heart rate noted  Respiratory: Normal respiratory effort, no problems with respiration noted  Abdomen: Soft, gravid, appropriate for gestational age. Pain/Pressure: Absent     Pelvic:  Cervical exam deferred        Extremities: Normal range of motion.  Edema: None  Mental Status: Normal mood and affect. Normal behavior. Normal judgment and thought content.   Assessment   25 y.o. [id number] at [redacted]w[redacted]d by  09/21/2021, by  Last Menstrual Period presenting for routine prenatal visit  Plan   THIRD Problems (from 02/23/21 to present)     Problem Noted Resolved   Prenatal care, subsequent pregnancy, second trimester 02/23/2021 by Zipporah Plants, CNM No   Overview Addendum 05/04/2021  4:13 PM by Nadara Mustard, MD     Nursing Staff Provider  Office Location  [facility] Dating   LMP, [place] confirmed  Language  English Anatomy US    Flu Vaccine   UTD Genetic Screen  NIPS: 03/09/21  TDaP vaccine    Hgb A1C or  GTT Early : n/a Third trimester :   Rhogam  Not needed   LAB RESULTS   Feeding Plan  breast Blood Type O/Positive/-- (05/13 1622)   Contraception BTL or vasec Antibody Negative (05/13 1622)  Circumcision  Rubella 2.21 (05/13 1622)  Pediatrician   RPR Non Reactive (05/13 1622)   Support Person  Minerva Areola - husband HBsAg Negative (05/13 1622)   Prenatal Classes  HIV Non Reactive (05/13 1622)    Varicella IMM   BTL Consent  Needs GBS  (For PCN allergy, check sensitivities)        VBAC Consent  desires R CS Pap  6/21 - NILM    Hgb Electro   normal adult hgb  Covid  Vax x2 CF      SMA                   Preterm labor symptoms and general obstetric precautions including but not limited to vaginal bleeding, contractions, leaking of fluid and fetal movement were reviewed in detail with the patient. Please refer to After Visit Summary for other counseling recommendations.   C-section /btl on 09/14/2021 Needs MCD ppw signed  Return in about 2 weeks (around 06/29/2021) for 28 wk labs with routine prenatal (may  make multiple 2 week appts).   Thomasene Mohair, MD, [facility], [facility] 06/15/2021 10:55 AM

## 2021-07-05 ENCOUNTER — Encounter: Payer: Self-pay | Admitting: Obstetrics and Gynecology

## 2021-07-05 ENCOUNTER — Other Ambulatory Visit: Payer: Medicaid Other

## 2021-07-05 ENCOUNTER — Other Ambulatory Visit: Payer: Self-pay

## 2021-07-05 ENCOUNTER — Ambulatory Visit (INDEPENDENT_AMBULATORY_CARE_PROVIDER_SITE_OTHER): Payer: Medicaid Other | Admitting: Obstetrics and Gynecology

## 2021-07-05 VITALS — BP 118/70 | Wt 146.0 lb

## 2021-07-05 DIAGNOSIS — Z98891 History of uterine scar from previous surgery: Secondary | ICD-10-CM

## 2021-07-05 DIAGNOSIS — Z113 Encounter for screening for infections with a predominantly sexual mode of transmission: Secondary | ICD-10-CM

## 2021-07-05 DIAGNOSIS — Z3483 Encounter for supervision of other normal pregnancy, third trimester: Secondary | ICD-10-CM

## 2021-07-05 DIAGNOSIS — Z3482 Encounter for supervision of other normal pregnancy, second trimester: Secondary | ICD-10-CM

## 2021-07-05 DIAGNOSIS — Z131 Encounter for screening for diabetes mellitus: Secondary | ICD-10-CM

## 2021-07-05 DIAGNOSIS — Z3A28 28 weeks gestation of pregnancy: Secondary | ICD-10-CM

## 2021-07-05 NOTE — Progress Notes (Signed)
Routine Prenatal Care Visit  Subjective  Nicole Hartman is a 25 y.o. G3P2002 at [redacted]w[redacted]d being seen today for ongoing prenatal care.  She is currently monitored for the following issues for this low-risk pregnancy and has PTSD (post-traumatic stress disorder); MDD (major depressive disorder), recurrent episode, severe (HCC); Menorrhagia; History of 2 cesarean sections; Prenatal care, subsequent pregnancy, second trimester; and Nausea/vomiting in pregnancy on their problem list.  ----------------------------------------------------------------------------------- Patient reports some stronger cramping/contracting recently and a couple of days (3-4 days ago) with pink-red/mucus discharge.   Contractions: Irritability. Vag. Bleeding: Scant.  Movement: Present. Leaking Fluid denies.  ----------------------------------------------------------------------------------- The following portions of the patient's history were reviewed and updated as appropriate: allergies, current medications, past family history, past medical history, past social history, past surgical history and problem list. Problem list updated.  Objective  Blood pressure 118/70, weight 146 lb (66.2 kg), last menstrual period 12/15/2020, currently breastfeeding. Pregravid weight 133 lb (60.3 kg) Total Weight Gain 13 lb (5.897 kg) Urinalysis: Urine Protein    Urine Glucose    Fetal Status: Fetal Heart Rate (bpm): 145 Fundal Height: 39 cm Movement: Present  Presentation: Vertex  General:  Alert, oriented and cooperative. Patient is in no acute distress.  Skin: Skin is warm and dry. No rash noted.   Cardiovascular: Normal heart rate noted  Respiratory: Normal respiratory effort, no problems with respiration noted  Abdomen: Soft, gravid, appropriate for gestational age. Pain/Pressure: Absent     Pelvic:  Cervical exam performed Dilation: Closed Effacement (%): 20 Station: Ballotable, SVE: no bleeding, cervix appears closed   Extremities: Normal range of motion.  Edema: None  Mental Status: Normal mood and affect. Normal behavior. Normal judgment and thought content.   Bedside abdominal ultrasound performed. Fetus in cephalic presentation. No evidence of placental separation.   Female chaperone present for pelvic exam  Assessment   25 y.o. [id number] at [redacted]w[redacted]d by  09/21/2021, by Last Menstrual Period presenting for routine prenatal visit  Plan   THIRD Problems (from 02/23/21 to present)     Problem Noted Resolved   Prenatal care, subsequent pregnancy, second trimester 02/23/2021 by Zipporah Plants, CNM No   Overview Addendum 05/04/2021  4:13 PM by Nadara Mustard, MD     Nursing Staff Provider  Office Location  [facility] Dating   LMP, [place] confirmed  Language  English Anatomy US    Flu Vaccine   UTD Genetic Screen  NIPS: 03/09/21  TDaP vaccine    Hgb A1C or  GTT Early : n/a Third trimester :   Rhogam  Not needed   LAB RESULTS   Feeding Plan  breast Blood Type O/Positive/-- (05/13 1622)   Contraception BTL or vasec Antibody Negative (05/13 1622)  Circumcision  Rubella 2.21 (05/13 1622)  Pediatrician   RPR Non Reactive (05/13 1622)   Support Person  Minerva Areola - husband HBsAg Negative (05/13 1622)   Prenatal Classes  HIV Non Reactive (05/13 1622)    Varicella IMM   BTL Consent  Needs GBS  (For PCN allergy, check sensitivities)        VBAC Consent  desires R CS Pap  6/21 - NILM    Hgb Electro   normal adult hgb  Covid  Vax x2 CF      SMA                   Preterm labor symptoms and general obstetric precautions including but not limited to vaginal bleeding, contractions, leaking of fluid and fetal movement  were reviewed in detail with the patient. Please refer to After Visit Summary for other counseling recommendations.   -28 wk labs today (RH+) -tubal ppw signed today - reassurance regarding bleeding and cramping. Discussed precautions moving forward.   ROB every 2 weeks for 4 visits (if used at 28  weeks can be used to schedule 30, 32, 34, 36 week visits- use for low risk patients not likely to have gestational diabetes)   Thomasene Mohair, MD, [facility], [facility] 07/05/2021 12:01 PM

## 2021-07-06 LAB — 28 WEEK RH+PANEL
Basophils Absolute: 0 10*3/uL (ref 0.0–0.2)
Basos: 1 %
EOS (ABSOLUTE): 0.1 10*3/uL (ref 0.0–0.4)
Eos: 1 %
Gestational Diabetes Screen: 115 mg/dL (ref 65–139)
HIV Screen 4th Generation wRfx: NONREACTIVE
Hematocrit: 31.9 % — ABNORMAL LOW (ref 34.0–46.6)
Hemoglobin: 10.1 g/dL — ABNORMAL LOW (ref 11.1–15.9)
Immature Grans (Abs): 0 10*3/uL (ref 0.0–0.1)
Immature Granulocytes: 1 %
Lymphocytes Absolute: 1.5 10*3/uL (ref 0.7–3.1)
Lymphs: 26 %
MCH: 26.9 pg (ref 26.6–33.0)
MCHC: 31.7 g/dL (ref 31.5–35.7)
MCV: 85 fL (ref 79–97)
Monocytes Absolute: 0.4 10*3/uL (ref 0.1–0.9)
Monocytes: 6 %
Neutrophils Absolute: 4 10*3/uL (ref 1.4–7.0)
Neutrophils: 65 %
Platelets: 86 10*3/uL — CL (ref 150–450)
RBC: 3.76 x10E6/uL — ABNORMAL LOW (ref 3.77–5.28)
RDW: 13.9 % (ref 11.7–15.4)
RPR Ser Ql: NONREACTIVE
WBC: 6.1 10*3/uL (ref 3.4–10.8)

## 2021-07-10 ENCOUNTER — Other Ambulatory Visit: Payer: Self-pay | Admitting: Obstetrics and Gynecology

## 2021-07-10 DIAGNOSIS — Z3483 Encounter for supervision of other normal pregnancy, third trimester: Secondary | ICD-10-CM

## 2021-07-10 DIAGNOSIS — O99119 Other diseases of the blood and blood-forming organs and certain disorders involving the immune mechanism complicating pregnancy, unspecified trimester: Secondary | ICD-10-CM | POA: Insufficient documentation

## 2021-07-10 DIAGNOSIS — D696 Thrombocytopenia, unspecified: Secondary | ICD-10-CM | POA: Insufficient documentation

## 2021-07-13 ENCOUNTER — Inpatient Hospital Stay: Payer: Medicaid Other

## 2021-07-13 ENCOUNTER — Inpatient Hospital Stay: Payer: Medicaid Other | Attending: Oncology | Admitting: Oncology

## 2021-07-13 ENCOUNTER — Encounter: Payer: Self-pay | Admitting: Oncology

## 2021-07-13 VITALS — BP 110/95 | HR 101 | Temp 97.0°F | Resp 16 | Wt 149.9 lb

## 2021-07-13 DIAGNOSIS — D696 Thrombocytopenia, unspecified: Secondary | ICD-10-CM

## 2021-07-13 DIAGNOSIS — D508 Other iron deficiency anemias: Secondary | ICD-10-CM

## 2021-07-13 DIAGNOSIS — O99113 Other diseases of the blood and blood-forming organs and certain disorders involving the immune mechanism complicating pregnancy, third trimester: Secondary | ICD-10-CM | POA: Insufficient documentation

## 2021-07-13 DIAGNOSIS — E538 Deficiency of other specified B group vitamins: Secondary | ICD-10-CM | POA: Diagnosis not present

## 2021-07-13 DIAGNOSIS — D509 Iron deficiency anemia, unspecified: Secondary | ICD-10-CM | POA: Insufficient documentation

## 2021-07-13 DIAGNOSIS — D649 Anemia, unspecified: Secondary | ICD-10-CM | POA: Insufficient documentation

## 2021-07-13 DIAGNOSIS — O99119 Other diseases of the blood and blood-forming organs and certain disorders involving the immune mechanism complicating pregnancy, unspecified trimester: Secondary | ICD-10-CM

## 2021-07-13 LAB — CBC WITH DIFFERENTIAL/PLATELET
Abs Immature Granulocytes: 0.08 10*3/uL — ABNORMAL HIGH (ref 0.00–0.07)
Basophils Absolute: 0 10*3/uL (ref 0.0–0.1)
Basophils Relative: 1 %
Eosinophils Absolute: 0.1 10*3/uL (ref 0.0–0.5)
Eosinophils Relative: 2 %
HCT: 31.3 % — ABNORMAL LOW (ref 36.0–46.0)
Hemoglobin: 9.8 g/dL — ABNORMAL LOW (ref 12.0–15.0)
Immature Granulocytes: 1 %
Lymphocytes Relative: 23 %
Lymphs Abs: 1.4 10*3/uL (ref 0.7–4.0)
MCH: 27.1 pg (ref 26.0–34.0)
MCHC: 31.3 g/dL (ref 30.0–36.0)
MCV: 86.5 fL (ref 80.0–100.0)
Monocytes Absolute: 0.4 10*3/uL (ref 0.1–1.0)
Monocytes Relative: 6 %
Neutro Abs: 4.2 10*3/uL (ref 1.7–7.7)
Neutrophils Relative %: 67 %
Platelets: 214 10*3/uL (ref 150–400)
RBC: 3.62 MIL/uL — ABNORMAL LOW (ref 3.87–5.11)
RDW: 13.7 % (ref 11.5–15.5)
WBC: 6.2 10*3/uL (ref 4.0–10.5)
nRBC: 0 % (ref 0.0–0.2)

## 2021-07-13 LAB — RETIC PANEL
Immature Retic Fract: 22.8 % — ABNORMAL HIGH (ref 2.3–15.9)
RBC.: 3.68 MIL/uL — ABNORMAL LOW (ref 3.87–5.11)
Retic Count, Absolute: 67.7 10*3/uL (ref 19.0–186.0)
Retic Ct Pct: 1.8 % (ref 0.4–3.1)
Reticulocyte Hemoglobin: 24.6 pg — ABNORMAL LOW (ref >27.9)

## 2021-07-13 LAB — FOLATE: Folate: 20.2 ng/mL (ref >5.9)

## 2021-07-13 LAB — TECHNOLOGIST SMEAR REVIEW
Plt Morphology: NORMAL
RBC MORPHOLOGY: NORMAL
WBC MORPHOLOGY: NORMAL

## 2021-07-13 LAB — HEPATITIS B CORE ANTIBODY, IGM: Hep B C IgM: NONREACTIVE

## 2021-07-13 LAB — IMMATURE PLATELET FRACTION: Immature Platelet Fraction: 2.6 % (ref 1.2–8.6)

## 2021-07-13 LAB — FERRITIN: Ferritin: 4 ng/mL — ABNORMAL LOW (ref 11–307)

## 2021-07-13 LAB — IRON AND TIBC
Iron: 32 ug/dL (ref 28–170)
Saturation Ratios: 5 % — ABNORMAL LOW (ref 10.4–31.8)
TIBC: 620 ug/dL — ABNORMAL HIGH (ref 250–450)
UIBC: 588 ug/dL

## 2021-07-13 LAB — LACTATE DEHYDROGENASE: LDH: 115 U/L (ref 98–192)

## 2021-07-13 LAB — HEPATITIS B SURFACE ANTIGEN: Hepatitis B Surface Ag: NONREACTIVE

## 2021-07-13 LAB — VITAMIN B12: Vitamin B-12: 175 pg/mL — ABNORMAL LOW (ref 180–914)

## 2021-07-13 NOTE — Progress Notes (Signed)
Hematology/Oncology Consult note [facility] [phone] [phone]   Patient Care Team: [facility], [facility] as PCP - General  REFERRING PROVIDER: Conard Novak, MD  CHIEF COMPLAINTS/REASON FOR VISIT:  Evaluation of thrombocytopenia  HISTORY OF PRESENTING ILLNESS:  Nicole Hartman is a 25 y.o. female who was seen in consultation at the request of Conard Novak, MD for evaluation of thrombocytopenia, and anemia.   Reviewed patient's labs done previously.  07/05/2021 Labs showed decreased platelet counts at 86,000 wbc is normal  hemoglobin 10.1 Reviewed patient's previous labs. Thrombocytopenia is new onset.  Anemia is chronic, since 2017.   Associated symptoms or signs:  Denies weight loss, fever, chills, fatigue, night sweats.  Denies hematochezia, hematuria, hematemesis, epistaxis, black tarry stool or easy bruising.  Patient reports that she has been on iron tablets, most recently 4 time per week.  She is currently pregnant, in third trimester.   Review of Systems  Constitutional:  Negative for appetite change, chills, fatigue and fever.  HENT:   Negative for hearing loss and voice change.   Eyes:  Negative for eye problems.  Respiratory:  Negative for chest tightness and cough.   Cardiovascular:  Negative for chest pain.  Gastrointestinal:  Negative for abdominal distention, abdominal pain and blood in stool.  Endocrine: Negative for hot flashes.  Genitourinary:  Negative for difficulty urinating and frequency.   Musculoskeletal:  Negative for arthralgias.  Skin:  Negative for itching and rash.  Neurological:  Negative for extremity weakness.  Hematological:  Negative for adenopathy.  Psychiatric/Behavioral:  Negative for confusion.    MEDICAL HISTORY:  Past Medical History:  Diagnosis Date   Anemia    Asthma    exercise induced   Cesarean delivery delivered 03/02/2016   Depression 2014   GERD  (gastroesophageal reflux disease)    Headache    h/o migraines   PTSD (post-traumatic stress disorder) 2014    SURGICAL HISTORY: Past Surgical History:  Procedure Laterality Date   CESAREAN SECTION N/A 03/01/2016   Procedure: CESAREAN SECTION;  Surgeon: Conard Novak, MD;  Location: [facility] ORS;  Service: Obstetrics;  Laterality: N/A;   CESAREAN SECTION N/A 02/11/2020   Procedure: CESAREAN SECTION;  Surgeon: Conard Novak, MD;  Location: [facility] ORS;  Service: Obstetrics;  Laterality: N/A;  RNFA    SOCIAL HISTORY: Social History   Socioeconomic History   Marital status: Married    Spouse name: Not on file   Number of children: Not on file   Years of education: Not on file   Highest education level: Not on file  Occupational History   Not on file  Tobacco Use   Smoking status: Never   Smokeless tobacco: Never  Vaping Use   Vaping Use: Never used  Substance and Sexual Activity   Alcohol use: No   Drug use: No   Sexual activity: Yes    Partners: Male    Birth control/protection: None  Other Topics Concern   Not on file  Social History Narrative   Not on file   Social Determinants of Health   Financial Resource Strain: Not on file  Food Insecurity: Not on file  Transportation Needs: Not on file  Physical Activity: Not on file  Stress: Not on file  Social Connections: Not on file  Intimate Partner Violence: Not on file    FAMILY HISTORY: Family History  Problem Relation Age of Onset   Breast cancer Maternal Aunt  not sure of age   Cancer - Colon Maternal Grandfather    Breast cancer Paternal Grandmother        79s    ALLERGIES:  has No Known Allergies.  MEDICATIONS:  Current Outpatient Medications  Medication Sig Dispense Refill   albuterol (VENTOLIN HFA) 108 (90 Base) MCG/ACT inhaler Inhale 1 puff into the lungs 3 (three) times daily as needed for wheezing (for cough).     Ferrous Sulfate (IRON) 325 (65 Fe) MG TABS Take 1 tablet by mouth daily.      Prenatal Vit-Fe Fumarate-FA (PNV PRENATAL PLUS MULTIVITAMIN) 27-1 MG TABS Take 1 tablet by mouth daily.      cyclobenzaprine (FLEXERIL) 5 MG tablet Take 5 mg by mouth 3 (three) times daily as needed. (Patient not taking: No sig reported)     No current facility-administered medications for this visit.     PHYSICAL EXAMINATION:  Vitals:   07/13/21 0926  BP: (!) 110/95  Pulse: (!) 101  Resp: 16  Temp: (!) 97 F (36.1 C)  SpO2: 100%   Filed Weights   07/13/21 0926  Weight: 149 lb 14.4 oz (68 kg)    Physical Exam Constitutional:      General: She is not in acute distress. HENT:     Head: Normocephalic and atraumatic.  Eyes:     General: No scleral icterus. Cardiovascular:     Rate and Rhythm: Normal rate and regular rhythm.     Heart sounds: Normal heart sounds.  Pulmonary:     Effort: Pulmonary effort is normal. No respiratory distress.     Breath sounds: No wheezing.  Abdominal:     General: Bowel sounds are normal. There is no distension.     Palpations: Abdomen is soft.  Musculoskeletal:        General: No deformity. Normal range of motion.     Cervical back: Normal range of motion and neck supple.  Skin:    General: Skin is warm and dry.     Findings: No erythema or rash.  Neurological:     Mental Status: She is alert and oriented to person, place, and time. Mental status is at baseline.     Cranial Nerves: No cranial nerve deficit.     Coordination: Coordination normal.  Psychiatric:        Mood and Affect: Mood normal.     LABORATORY DATA:  I have reviewed the data as listed Lab Results  Component Value Date   WBC 6.2 07/13/2021   HGB 9.8 (L) 07/13/2021   HCT 31.3 (L) 07/13/2021   MCV 86.5 07/13/2021   PLT 214 07/13/2021   No results for input(s): NA, K, CL, CO2, GLUCOSE, BUN, CREATININE, CALCIUM, GFRNONAA, GFRAA, PROT, ALBUMIN, AST, ALT, ALKPHOS, BILITOT, BILIDIR, IBILI in the last 8760 hours. Iron/TIBC/Ferritin/ %Sat    Component Value  Date/Time   IRON 32 07/13/2021 0955   TIBC 620 (H) 07/13/2021 0955   FERRITIN 4 (L) 07/13/2021 0955   IRONPCTSAT 5 (L) 07/13/2021 0955      RADIOGRAPHIC STUDIES: I have personally reviewed the radiological images as listed and agreed with the findings in the report.  No results found.   ASSESSMENT & PLAN:  1. Other iron deficiency anemia   2. Thrombocytopenia affecting pregnancy, antepartum (HCC)   3. B12 deficiency    For the work up of patient's thrombocytopenia, I recommend checking CBC;CMP, LDH; smear review, folate, Vitamin B12, hepatitis, HIV,  flowcytometry.   For anemia, check iron TIBC  ferritin.   # Patient follow-up with me in approximately 7-10 days to review the above results.   Some lab results  are available after her visit. B12 deficiency and iron deficiency. Plan IV venofer and B12 supplementation.   Orders Placed This Encounter  Procedures   Technologist smear review    Standing Status:   Future    Number of Occurrences:   1    Standing Expiration Date:   07/13/2022   CBC with Differential/Platelet    Standing Status:   Future    Number of Occurrences:   1    Standing Expiration Date:   07/13/2022   Retic Panel    Standing Status:   Future    Number of Occurrences:   1    Standing Expiration Date:   07/13/2022   Ferritin    Standing Status:   Future    Number of Occurrences:   1    Standing Expiration Date:   01/10/2022   Vitamin B12    Standing Status:   Future    Number of Occurrences:   1    Standing Expiration Date:   07/13/2022   Folate    Standing Status:   Future    Number of Occurrences:   1    Standing Expiration Date:   07/13/2022   Iron and TIBC    Standing Status:   Future    Number of Occurrences:   1    Standing Expiration Date:   07/13/2022   Lactate dehydrogenase    Standing Status:   Future    Number of Occurrences:   1    Standing Expiration Date:   07/13/2022   Flow cytometry panel-leukemia/lymphoma work-up    Standing Status:    Future    Number of Occurrences:   1    Standing Expiration Date:   07/13/2022   Immature Platelet Fraction    Standing Status:   Future    Number of Occurrences:   1    Standing Expiration Date:   07/13/2022   Hepatitis B core antibody, IgM    Standing Status:   Future    Number of Occurrences:   1    Standing Expiration Date:   07/13/2022   Hepatitis B surface antigen    Standing Status:   Future    Number of Occurrences:   1    Standing Expiration Date:   07/13/2022    All questions were answered. The patient knows to call the clinic with any problems questions or concerns.  Cc Conard Novak, MD  Thank you for this kind referral and the opportunity to participate in the care of this patient. A copy of today's note is routed to referring provider    Rickard Patience, MD, PhD 07/13/2021

## 2021-07-16 ENCOUNTER — Inpatient Hospital Stay: Payer: Medicaid Other | Attending: Oncology

## 2021-07-16 ENCOUNTER — Telehealth: Payer: Self-pay

## 2021-07-16 DIAGNOSIS — D509 Iron deficiency anemia, unspecified: Secondary | ICD-10-CM | POA: Diagnosis present

## 2021-07-16 DIAGNOSIS — O99113 Other diseases of the blood and blood-forming organs and certain disorders involving the immune mechanism complicating pregnancy, third trimester: Secondary | ICD-10-CM | POA: Diagnosis present

## 2021-07-16 DIAGNOSIS — E538 Deficiency of other specified B group vitamins: Secondary | ICD-10-CM | POA: Diagnosis present

## 2021-07-16 DIAGNOSIS — D696 Thrombocytopenia, unspecified: Secondary | ICD-10-CM | POA: Insufficient documentation

## 2021-07-16 LAB — COMP PANEL: LEUKEMIA/LYMPHOMA

## 2021-07-16 MED ORDER — CYANOCOBALAMIN 1000 MCG/ML IJ SOLN
1000.0000 ug | Freq: Once | INTRAMUSCULAR | Status: AC
Start: 2021-07-16 — End: 2021-07-16
  Administered 2021-07-16: 1000 ug via INTRAMUSCULAR
  Filled 2021-07-16: qty 1

## 2021-07-16 NOTE — Telephone Encounter (Signed)
-----   Message from Rickard Patience, MD sent at 07/13/2021  4:45 PM EDT ----- Please let her know that her B12 is low. Recommend daily B12 x 5, keep current appt with Venofer, add B12 injection

## 2021-07-16 NOTE — Telephone Encounter (Signed)
Mychart message sent to pt to let her know about MD recommendation. Please schedule b12 inj daily x5 and add B12 inj to appt on 10/7. Please contact pt with appts. thanks

## 2021-07-16 NOTE — Progress Notes (Signed)
Called pt, no answer. Left detailed message and a callback phone number in case of questions or concerns.

## 2021-07-17 ENCOUNTER — Encounter: Payer: Self-pay | Admitting: Oncology

## 2021-07-17 ENCOUNTER — Inpatient Hospital Stay: Payer: Medicaid Other

## 2021-07-17 DIAGNOSIS — E538 Deficiency of other specified B group vitamins: Secondary | ICD-10-CM

## 2021-07-17 DIAGNOSIS — D509 Iron deficiency anemia, unspecified: Secondary | ICD-10-CM | POA: Diagnosis not present

## 2021-07-17 MED ORDER — CYANOCOBALAMIN 1000 MCG/ML IJ SOLN
1000.0000 ug | Freq: Once | INTRAMUSCULAR | Status: AC
  Administered 2021-07-17: 1000 ug via INTRAMUSCULAR
  Filled 2021-07-17: qty 1

## 2021-07-18 ENCOUNTER — Encounter: Payer: Self-pay | Admitting: Advanced Practice Midwife

## 2021-07-18 ENCOUNTER — Inpatient Hospital Stay: Payer: Medicaid Other

## 2021-07-18 ENCOUNTER — Ambulatory Visit (INDEPENDENT_AMBULATORY_CARE_PROVIDER_SITE_OTHER): Payer: Medicaid Other | Admitting: Advanced Practice Midwife

## 2021-07-18 ENCOUNTER — Other Ambulatory Visit: Payer: Self-pay

## 2021-07-18 VITALS — BP 117/73 | Wt 149.0 lb

## 2021-07-18 DIAGNOSIS — O99119 Other diseases of the blood and blood-forming organs and certain disorders involving the immune mechanism complicating pregnancy, unspecified trimester: Secondary | ICD-10-CM

## 2021-07-18 DIAGNOSIS — D696 Thrombocytopenia, unspecified: Secondary | ICD-10-CM

## 2021-07-18 DIAGNOSIS — Z3A30 30 weeks gestation of pregnancy: Secondary | ICD-10-CM

## 2021-07-18 DIAGNOSIS — D509 Iron deficiency anemia, unspecified: Secondary | ICD-10-CM | POA: Diagnosis not present

## 2021-07-18 DIAGNOSIS — Z23 Encounter for immunization: Secondary | ICD-10-CM

## 2021-07-18 DIAGNOSIS — O0993 Supervision of high risk pregnancy, unspecified, third trimester: Secondary | ICD-10-CM

## 2021-07-18 DIAGNOSIS — D649 Anemia, unspecified: Secondary | ICD-10-CM

## 2021-07-18 DIAGNOSIS — E538 Deficiency of other specified B group vitamins: Secondary | ICD-10-CM

## 2021-07-18 LAB — POCT URINALYSIS DIPSTICK OB
Glucose, UA: NEGATIVE
POC,PROTEIN,UA: NEGATIVE

## 2021-07-18 MED ORDER — CYANOCOBALAMIN 1000 MCG/ML IJ SOLN
1000.0000 ug | Freq: Once | INTRAMUSCULAR | Status: AC
  Administered 2021-07-18: 1000 ug via INTRAMUSCULAR
  Filled 2021-07-18: qty 1

## 2021-07-18 NOTE — Progress Notes (Signed)
Routine Prenatal Care Visit  Subjective  Nicole Hartman is a 25 y.o. G3P2002 at [redacted]w[redacted]d being seen today for ongoing prenatal care.  She is currently monitored for the following issues for this high-risk pregnancy and has PTSD (post-traumatic stress disorder); MDD (major depressive disorder), recurrent episode, severe (HCC); Supervision of high risk pregnancy, antepartum; Prenatal care, subsequent pregnancy, second trimester; Nausea/vomiting in pregnancy; Thrombocytopenia affecting pregnancy, antepartum (HCC); Depressed mood; Pelvic pain in pregnancy; B12 deficiency; Absolute anemia; Anemia; and History of cesarean delivery affecting pregnancy on their problem list.  ----------------------------------------------------------------------------------- Patient reports pelvic area pain that comes and goes throughout the day sometimes lasting for an hour. She had scant pink discharge when wiping yesterday and none since. She admits inadequate hydration in the past week. Her first iron infusion is Friday this week.   Contractions: Not present. Vag. Bleeding: Scant.  Movement: Present. Leaking Fluid denies.  ----------------------------------------------------------------------------------- The following portions of the patient's history were reviewed and updated as appropriate: allergies, current medications, past family history, past medical history, past social history, past surgical history and problem list. Problem list updated.  Objective  Blood pressure 117/73, weight 149 lb (67.6 kg), last menstrual period 12/15/2020, currently breastfeeding. Pregravid weight 133 lb (60.3 kg) Total Weight Gain 16 lb (7.258 kg) Urinalysis: Urine Protein Negative  Urine Glucose Negative  Fetal Status: Fetal Heart Rate (bpm): 138 Fundal Height: 31 cm Movement: Present     General:  Alert, oriented and cooperative. Patient is in no acute distress.  Skin: Skin is warm and dry. No rash noted.   Cardiovascular:  Normal heart rate noted  Respiratory: Normal respiratory effort, no problems with respiration noted  Abdomen: Soft, gravid, appropriate for gestational age. Pain/Pressure: Present     Pelvic:  Cervical exam deferred        Extremities: Normal range of motion.  Edema: None  Mental Status: Normal mood and affect. Normal behavior. Normal judgment and thought content.   Assessment   25 y.o. [id number] at [redacted]w[redacted]d by  09/21/2021, by Last Menstrual Period presenting for routine prenatal visit  Plan   THIRD Problems (from 02/23/21 to present)    Problem Noted Resolved   Anemia  by [name], CNM No   Thrombocytopenia affecting pregnancy, antepartum (HCC) 07/10/2021 by Conard Novak, MD No   Overview Signed 07/10/2021  1:12 PM by Conard Novak, MD    Platelets 87 at 28 weeks Heme consult      Supervision of high risk pregnancy, antepartum 07/02/2019 by Oswaldo Conroy, CNM No   Overview Addendum 07/18/2021  8:31 AM by [name], CNM     Nursing Staff Provider  Office Location  [facility] Dating   LMP, [place] confirmed  Language  English Anatomy US    Flu Vaccine   UTD Genetic Screen  NIPS: 03/09/21  TDaP vaccine   07/18/21 Hgb A1C or  GTT Early : n/a Third trimester :   Rhogam  Not needed   LAB RESULTS   Feeding Plan  breast Blood Type O/Positive/-- (05/13 1622)   Contraception BTL or vasec Antibody Negative (05/13 1622)  Circumcision  Rubella 2.21 (05/13 1622)  Pediatrician   RPR Non Reactive (05/13 1622)   Support Person  Minerva Areola - husband HBsAg Negative (05/13 1622)   Prenatal Classes  HIV Non Reactive (05/13 1622)    Varicella IMM   BTL Consent  07/05/21 GBS  (For PCN allergy, check sensitivities)        VBAC Consent  desires R  CS Pap  6/21 - NILM    Hgb Electro   normal adult hgb  Covid  Vax x2 CF      SMA                 TDAP given today   Preterm labor symptoms and general obstetric precautions including but not limited to vaginal bleeding, contractions, leaking  of fluid and fetal movement were reviewed in detail with the patient. Please refer to After Visit Summary for other counseling recommendations.   Return in about 2 weeks (around 08/01/2021) for rob x3 visits.  [name], CNM 07/18/2021 8:41 AM

## 2021-07-18 NOTE — Progress Notes (Signed)
ROB - still having stomach pain

## 2021-07-19 ENCOUNTER — Inpatient Hospital Stay: Payer: Medicaid Other

## 2021-07-19 DIAGNOSIS — D509 Iron deficiency anemia, unspecified: Secondary | ICD-10-CM | POA: Diagnosis not present

## 2021-07-19 DIAGNOSIS — E538 Deficiency of other specified B group vitamins: Secondary | ICD-10-CM

## 2021-07-19 MED ORDER — CYANOCOBALAMIN 1000 MCG/ML IJ SOLN
1000.0000 ug | Freq: Once | INTRAMUSCULAR | Status: AC
  Administered 2021-07-19: 1000 ug via INTRAMUSCULAR
  Filled 2021-07-19: qty 1

## 2021-07-20 ENCOUNTER — Other Ambulatory Visit: Payer: Self-pay

## 2021-07-20 ENCOUNTER — Inpatient Hospital Stay: Payer: Medicaid Other

## 2021-07-20 ENCOUNTER — Inpatient Hospital Stay (HOSPITAL_BASED_OUTPATIENT_CLINIC_OR_DEPARTMENT_OTHER): Payer: Medicaid Other | Admitting: Oncology

## 2021-07-20 ENCOUNTER — Encounter: Payer: Self-pay | Admitting: Oncology

## 2021-07-20 VITALS — BP 112/75 | HR 103 | Temp 97.2°F | Resp 16 | Wt 149.0 lb

## 2021-07-20 VITALS — BP 111/67 | HR 88 | Temp 96.0°F | Resp 18

## 2021-07-20 DIAGNOSIS — D508 Other iron deficiency anemias: Secondary | ICD-10-CM

## 2021-07-20 DIAGNOSIS — E538 Deficiency of other specified B group vitamins: Secondary | ICD-10-CM

## 2021-07-20 DIAGNOSIS — Z3493 Encounter for supervision of normal pregnancy, unspecified, third trimester: Secondary | ICD-10-CM

## 2021-07-20 DIAGNOSIS — D509 Iron deficiency anemia, unspecified: Secondary | ICD-10-CM | POA: Diagnosis not present

## 2021-07-20 MED ORDER — SODIUM CHLORIDE 0.9 % IV SOLN
Freq: Once | INTRAVENOUS | Status: AC
  Filled 2021-07-20: qty 250

## 2021-07-20 MED ORDER — CYANOCOBALAMIN 1000 MCG/ML IJ SOLN
1000.0000 ug | Freq: Once | INTRAMUSCULAR | Status: AC
  Administered 2021-07-20: 1000 ug via INTRAMUSCULAR
  Filled 2021-07-20: qty 1

## 2021-07-20 MED ORDER — SODIUM CHLORIDE 0.9 % IV SOLN: 200.0000 mg | Freq: Once | INTRAVENOUS | Status: DC

## 2021-07-20 MED ORDER — IRON SUCROSE 20 MG/ML IV SOLN
200.0000 mg | Freq: Once | INTRAVENOUS | Status: AC
  Administered 2021-07-20: 200 mg via INTRAVENOUS

## 2021-07-20 NOTE — Progress Notes (Signed)
Patient here for oncology follow-up appointment, concerns of nausea and fatigue. States she didn't "feel so well after last B 12 injection".

## 2021-07-20 NOTE — Patient Instructions (Signed)

## 2021-07-20 NOTE — Progress Notes (Signed)
Hematology/Oncology Consult note [facility] Telephone:([phone] [phone]   Patient Care Team: [facility] as PCP - General  REFERRING PROVIDER: [facility]*  CHIEF COMPLAINTS/REASON FOR VISIT:  Follow up for anemia and thrombocytopenia  HISTORY OF PRESENTING ILLNESS:  Nicole Hartman is a 25 y.o. female who was seen in consultation at the request of [facility], [employer]* for evaluation of thrombocytopenia, and anemia.   Reviewed patient's labs done previously.  07/05/2021 Labs showed decreased platelet counts at 86,000 wbc is normal  hemoglobin 10.1 Reviewed patient's previous labs. Thrombocytopenia is new onset.  Anemia is chronic, since 2017.   Associated symptoms or signs:  Denies weight loss, fever, chills, fatigue, night sweats.  Denies hematochezia, hematuria, hematemesis, epistaxis, black tarry stool or easy bruising.  Patient reports that she has been on iron tablets, most recently 4 time per week.  She is currently pregnant, in third trimester.   INTERVAL HISTORY Nicole Hartman is a 25 y.o. female who has above history reviewed by me today presents for follow up visit for management of iron deficiency anemia in pregnancy and vitamin B12 deficiency Problems and complaints are listed below: She had blood work done since last visit and she presents to discuss results.  She has started on B12 injections.  Still feel fatigued.   Review of Systems  Constitutional:  Positive for fatigue. Negative for appetite change, chills and fever.  HENT:   Negative for hearing loss and voice change.   Eyes:  Negative for eye problems.  Respiratory:  Negative for chest tightness and cough.   Cardiovascular:  Negative for chest pain.  Gastrointestinal:  Negative for abdominal distention, abdominal pain and blood in stool.  Endocrine: Negative for hot flashes.  Genitourinary:  Negative for difficulty  urinating and frequency.   Musculoskeletal:  Negative for arthralgias.  Skin:  Negative for itching and rash.  Neurological:  Negative for extremity weakness.  Hematological:  Negative for adenopathy.  Psychiatric/Behavioral:  Negative for confusion.    MEDICAL HISTORY:  Past Medical History:  Diagnosis Date   Anemia    Asthma    exercise induced   Cesarean delivery delivered 03/02/2016   Depression 2014   GERD (gastroesophageal reflux disease)    Headache    h/o migraines   PTSD (post-traumatic stress disorder) 2014    SURGICAL HISTORY: Past Surgical History:  Procedure Laterality Date   CESAREAN SECTION N/A 03/01/2016   Procedure: CESAREAN SECTION;  Surgeon: Conard Novak, MD;  Location: [facility] ORS;  Service: Obstetrics;  Laterality: N/A;   CESAREAN SECTION N/A 02/11/2020   Procedure: CESAREAN SECTION;  Surgeon: Conard Novak, MD;  Location: [facility] ORS;  Service: Obstetrics;  Laterality: N/A;  RNFA    SOCIAL HISTORY: Social History   Socioeconomic History   Marital status: Married    Spouse name: Not on file   Number of children: Not on file   Years of education: Not on file   Highest education level: Not on file  Occupational History   Not on file  Tobacco Use   Smoking status: Never   Smokeless tobacco: Never  Vaping Use   Vaping Use: Never used  Substance and Sexual Activity   Alcohol use: No   Drug use: No   Sexual activity: Yes    Partners: Male    Birth control/protection: None  Other Topics Concern   Not on file  Social History Narrative   Not on file   Social  Determinants of Health   Financial Resource Strain: Not on file  Food Insecurity: Not on file  Transportation Needs: Not on file  Physical Activity: Not on file  Stress: Not on file  Social Connections: Not on file  Intimate Partner Violence: Not on file    FAMILY HISTORY: Family History  Problem Relation Age of Onset   Breast cancer Maternal Aunt        not sure of age    Cancer - Colon Maternal Grandfather    Breast cancer Paternal Grandmother        38s    ALLERGIES:  has No Known Allergies.  MEDICATIONS:  Current Outpatient Medications  Medication Sig Dispense Refill   albuterol (VENTOLIN HFA) 108 (90 Base) MCG/ACT inhaler Inhale 1 puff into the lungs 3 (three) times daily as needed for wheezing (for cough).     Ferrous Sulfate (IRON) 325 (65 Fe) MG TABS Take 1 tablet by mouth daily.     Prenatal Vit-Fe Fumarate-FA (PNV PRENATAL PLUS MULTIVITAMIN) 27-1 MG TABS Take 1 tablet by mouth daily.      cyclobenzaprine (FLEXERIL) 5 MG tablet Take 5 mg by mouth 3 (three) times daily as needed. (Patient not taking: No sig reported)     No current facility-administered medications for this visit.     PHYSICAL EXAMINATION:  Vitals:   07/20/21 1230  BP: 112/75  Pulse: (!) 103  Resp: 16  Temp: (!) 97.2 F (36.2 C)  SpO2: 98%   Filed Weights   07/20/21 1230  Weight: 149 lb (67.6 kg)    Physical Exam Constitutional:      General: She is not in acute distress. HENT:     Head: Normocephalic and atraumatic.  Eyes:     General: No scleral icterus. Cardiovascular:     Rate and Rhythm: Normal rate and regular rhythm.     Heart sounds: Normal heart sounds.  Pulmonary:     Effort: Pulmonary effort is normal. No respiratory distress.     Breath sounds: No wheezing.  Abdominal:     General: Bowel sounds are normal. There is no distension.     Palpations: Abdomen is soft.  Musculoskeletal:        General: No deformity. Normal range of motion.     Cervical back: Normal range of motion and neck supple.  Skin:    General: Skin is warm and dry.     Findings: No erythema or rash.  Neurological:     Mental Status: She is alert and oriented to person, place, and time. Mental status is at baseline.     Cranial Nerves: No cranial nerve deficit.     Coordination: Coordination normal.  Psychiatric:        Mood and Affect: Mood normal.     LABORATORY  DATA:  I have reviewed the data as listed Lab Results  Component Value Date   WBC 6.2 07/13/2021   HGB 9.8 (L) 07/13/2021   HCT 31.3 (L) 07/13/2021   MCV 86.5 07/13/2021   PLT 214 07/13/2021   No results for input(s): NA, K, CL, CO2, GLUCOSE, BUN, CREATININE, CALCIUM, GFRNONAA, GFRAA, PROT, ALBUMIN, AST, ALT, ALKPHOS, BILITOT, BILIDIR, IBILI in the last 8760 hours. Iron/TIBC/Ferritin/ %Sat    Component Value Date/Time   IRON 32 07/13/2021 0955   TIBC 620 (H) 07/13/2021 0955   FERRITIN 4 (L) 07/13/2021 0955   IRONPCTSAT 5 (L) 07/13/2021 0955      RADIOGRAPHIC STUDIES: I have personally reviewed the radiological  images as listed and agreed with the findings in the report.  No results found.   ASSESSMENT & PLAN:  1. Other iron deficiency anemia   2. B12 deficiency   3. Third trimester pregnancy    Iron deficiency anemia during pregnancy.  Labs are reviewed and discussed with patient. Ferritin 4, iron saturation 5%, Hb 9.8 Plan IV iron with Venofer 200mg  weekly x 4 doses. Allergy reactions/infusion reaction including anaphylactic reaction discussed with patient. Other side effects include but not limited to high blood pressure, skin rash, weight gain, leg swelling, etc. Patient voices understanding and willing to proceed.  # Vitamin B12 deficiency Recommend daily B12 injection x 5 followed by weekly B12 injection x 4.  Check intrinsic factor and anti parietal antibody.   # Thrombocytopenia has resolved.    Orders Placed This Encounter  Procedures   CBC with Differential/Platelet    Standing Status:   Future    Standing Expiration Date:   07/20/2022   Ferritin    Standing Status:   Future    Standing Expiration Date:   07/20/2022   Iron and TIBC    Standing Status:   Future    Standing Expiration Date:   07/20/2022   Vitamin B12    Standing Status:   Future    Standing Expiration Date:   07/20/2022    All questions were answered. The patient knows to call the clinic  with any problems questions or concerns.  [facility], 09/19/2022*  Follow up in 6 weeks. Lab MD +/- Venofer +/- B12  [employer], MD, PhD 07/20/2021

## 2021-07-27 ENCOUNTER — Inpatient Hospital Stay: Payer: Medicaid Other

## 2021-07-27 ENCOUNTER — Other Ambulatory Visit: Payer: Self-pay

## 2021-07-27 VITALS — BP 102/66 | HR 92 | Temp 97.2°F | Resp 18

## 2021-07-27 DIAGNOSIS — D509 Iron deficiency anemia, unspecified: Secondary | ICD-10-CM | POA: Diagnosis not present

## 2021-07-27 DIAGNOSIS — E538 Deficiency of other specified B group vitamins: Secondary | ICD-10-CM

## 2021-07-27 MED ORDER — IRON SUCROSE 20 MG/ML IV SOLN
200.0000 mg | Freq: Once | INTRAVENOUS | Status: AC
  Administered 2021-07-27: 200 mg via INTRAVENOUS
  Filled 2021-07-27: qty 10

## 2021-07-27 MED ORDER — CYANOCOBALAMIN 1000 MCG/ML IJ SOLN
1000.0000 ug | Freq: Once | INTRAMUSCULAR | Status: AC
  Administered 2021-07-27: 1000 ug via INTRAMUSCULAR
  Filled 2021-07-27: qty 1

## 2021-07-27 MED ORDER — SODIUM CHLORIDE 0.9 % IV SOLN: 200.0000 mg | Freq: Once | INTRAVENOUS | Status: DC

## 2021-07-27 MED ORDER — SODIUM CHLORIDE 0.9 % IV SOLN
Freq: Once | INTRAVENOUS | Status: AC
  Filled 2021-07-27: qty 250

## 2021-07-30 ENCOUNTER — Telehealth: Payer: Self-pay

## 2021-07-30 NOTE — Telephone Encounter (Signed)
Spoke w/patient. She reports the lump/bump is at the edge of her lip closer to her anus. Advised could be an ingrown hair, or clogged sweat gland/cyst. Can try warm compresses 2-3 times a day and have evaluated at her apt on Wed. Can take Tylenol for pain if needed.

## 2021-07-30 NOTE — Telephone Encounter (Signed)
Patient states she has had some pain in her pubic area. Assumed it was from pressure from the baby. She noticed while in the shower a lump/bump on her left side which hurts more so than the right side when she lifts her leg. The area is very tender to touch. She has an appt on Wed. It's been hurting for a while. [phone]

## 2021-07-30 NOTE — Telephone Encounter (Signed)
LMVM TRC. Advised to ask for Rabecca Birge 

## 2021-08-01 ENCOUNTER — Encounter: Payer: Self-pay | Admitting: Advanced Practice Midwife

## 2021-08-01 ENCOUNTER — Ambulatory Visit (INDEPENDENT_AMBULATORY_CARE_PROVIDER_SITE_OTHER): Payer: Medicaid Other | Admitting: Advanced Practice Midwife

## 2021-08-01 ENCOUNTER — Other Ambulatory Visit: Payer: Self-pay

## 2021-08-01 VITALS — BP 119/72 | Wt 150.0 lb

## 2021-08-01 DIAGNOSIS — D696 Thrombocytopenia, unspecified: Secondary | ICD-10-CM

## 2021-08-01 DIAGNOSIS — O99119 Other diseases of the blood and blood-forming organs and certain disorders involving the immune mechanism complicating pregnancy, unspecified trimester: Secondary | ICD-10-CM

## 2021-08-01 DIAGNOSIS — O0993 Supervision of high risk pregnancy, unspecified, third trimester: Secondary | ICD-10-CM

## 2021-08-01 DIAGNOSIS — Z3A32 32 weeks gestation of pregnancy: Secondary | ICD-10-CM

## 2021-08-01 DIAGNOSIS — O34219 Maternal care for unspecified type scar from previous cesarean delivery: Secondary | ICD-10-CM

## 2021-08-01 LAB — POCT URINALYSIS DIPSTICK OB
Glucose, UA: NEGATIVE
POC,PROTEIN,UA: NEGATIVE

## 2021-08-01 NOTE — Progress Notes (Signed)
ROB - no concerns. RM 2 

## 2021-08-01 NOTE — Progress Notes (Signed)
Routine Prenatal Care Visit  Subjective  Nicole Hartman is a 25 y.o. G3P2002 at [redacted]w[redacted]d being seen today for ongoing prenatal care.  She is currently monitored for the following issues for this high-risk pregnancy and has PTSD (post-traumatic stress disorder); MDD (major depressive disorder), recurrent episode, severe (HCC); Supervision of high risk pregnancy, antepartum; Prenatal care, subsequent pregnancy, second trimester; Nausea/vomiting in pregnancy; Thrombocytopenia affecting pregnancy, antepartum (HCC); Depressed mood; Pelvic pain in pregnancy; B12 deficiency; Absolute anemia; Anemia; and History of cesarean delivery affecting pregnancy on their problem list.  ----------------------------------------------------------------------------------- Patient reports low back ache. She describes an external groin area bump that is red and painful that she first noticed on Sunday. She tried hot bath and compresses with some relief. She denies concern for HSV and will continue with compresses and abx ointment topically.   Contractions: Not present. Vag. Bleeding: None.  Movement: Present. Leaking Fluid denies.  ----------------------------------------------------------------------------------- The following portions of the patient's history were reviewed and updated as appropriate: allergies, current medications, past family history, past medical history, past social history, past surgical history and problem list. Problem list updated.  Objective  Blood pressure 119/72, weight 150 lb (68 kg), last menstrual period 12/15/2020, currently breastfeeding. Pregravid weight 133 lb (60.3 kg) Total Weight Gain 17 lb (7.711 kg) Urinalysis: Urine Protein Negative  Urine Glucose Negative  Fetal Status: Fetal Heart Rate (bpm): 148 Fundal Height: 33 cm Movement: Present     General:  Alert, oriented and cooperative. Patient is in no acute distress.  Skin: Skin is warm and dry. No rash noted.   Cardiovascular:  Normal heart rate noted  Respiratory: Normal respiratory effort, no problems with respiration noted  Abdomen: Soft, gravid, appropriate for gestational age. Pain/Pressure: Absent     Pelvic:  Cervical exam deferred        Extremities: Normal range of motion.  Edema: None  Mental Status: Normal mood and affect. Normal behavior. Normal judgment and thought content.   Assessment   25 y.o. [id number] at [redacted]w[redacted]d by  09/21/2021, by Last Menstrual Period presenting for routine prenatal visit  Plan   THIRD Problems (from 02/23/21 to present)    Problem Noted Resolved   Anemia  by [name], CNM No   Thrombocytopenia affecting pregnancy, antepartum (HCC) 07/10/2021 by Conard Novak, MD No   Overview Signed 07/10/2021  1:12 PM by Conard Novak, MD    Platelets 87 at 28 weeks Heme consult      Supervision of high risk pregnancy, antepartum 07/02/2019 by Oswaldo Conroy, CNM No   Overview Addendum 07/18/2021  8:31 AM by [name], CNM     Nursing Staff Provider  Office Location  [facility] Dating   LMP, [place] confirmed  Language  English Anatomy US    Flu Vaccine   UTD Genetic Screen  NIPS: 03/09/21  TDaP vaccine   07/18/21 Hgb A1C or  GTT Early : n/a Third trimester :   Rhogam  Not needed   LAB RESULTS   Feeding Plan  breast Blood Type O/Positive/-- (05/13 1622)   Contraception BTL or vasec Antibody Negative (05/13 1622)  Circumcision  Rubella 2.21 (05/13 1622)  Pediatrician   RPR Non Reactive (05/13 1622)   Support Person  Minerva Areola - husband HBsAg Negative (05/13 1622)   Prenatal Classes  HIV Non Reactive (05/13 1622)    Varicella IMM   BTL Consent  07/05/21 GBS  (For PCN allergy, check sensitivities)        VBAC Consent  desires R CS Pap  6/21 - NILM    Hgb Electro   normal adult hgb  Covid  Vax x2 CF      SMA                    Preterm labor symptoms and general obstetric precautions including but not limited to vaginal bleeding, contractions, leaking of fluid and fetal  movement were reviewed in detail with the patient.   Return for scheduled appointments.  [name], CNM 08/01/2021 8:35 AM

## 2021-08-03 ENCOUNTER — Other Ambulatory Visit: Payer: Self-pay

## 2021-08-03 ENCOUNTER — Inpatient Hospital Stay: Payer: Medicaid Other

## 2021-08-03 VITALS — BP 109/69 | HR 90 | Temp 96.0°F

## 2021-08-03 DIAGNOSIS — E538 Deficiency of other specified B group vitamins: Secondary | ICD-10-CM

## 2021-08-03 DIAGNOSIS — D509 Iron deficiency anemia, unspecified: Secondary | ICD-10-CM | POA: Diagnosis not present

## 2021-08-03 MED ORDER — IRON SUCROSE 20 MG/ML IV SOLN
200.0000 mg | Freq: Once | INTRAVENOUS | Status: AC
  Administered 2021-08-03: 200 mg via INTRAVENOUS
  Filled 2021-08-03: qty 10

## 2021-08-03 MED ORDER — SODIUM CHLORIDE 0.9 % IV SOLN: 200.0000 mg | Freq: Once | INTRAVENOUS | Status: DC

## 2021-08-03 MED ORDER — CYANOCOBALAMIN 1000 MCG/ML IJ SOLN
1000.0000 ug | Freq: Once | INTRAMUSCULAR | Status: AC
  Administered 2021-08-03: 1000 ug via INTRAMUSCULAR
  Filled 2021-08-03: qty 1

## 2021-08-03 MED ORDER — SODIUM CHLORIDE 0.9 % IV SOLN
Freq: Once | INTRAVENOUS | Status: AC
Start: 2021-08-03 — End: 2021-08-03
  Filled 2021-08-03: qty 250

## 2021-08-10 ENCOUNTER — Other Ambulatory Visit: Payer: Self-pay

## 2021-08-10 ENCOUNTER — Inpatient Hospital Stay: Payer: Medicaid Other

## 2021-08-10 VITALS — BP 113/64 | HR 83 | Temp 97.9°F

## 2021-08-10 DIAGNOSIS — E538 Deficiency of other specified B group vitamins: Secondary | ICD-10-CM

## 2021-08-10 DIAGNOSIS — D509 Iron deficiency anemia, unspecified: Secondary | ICD-10-CM | POA: Diagnosis not present

## 2021-08-10 MED ORDER — IRON SUCROSE 20 MG/ML IV SOLN
200.0000 mg | Freq: Once | INTRAVENOUS | Status: AC
  Administered 2021-08-10: 200 mg via INTRAVENOUS
  Filled 2021-08-10: qty 10

## 2021-08-10 MED ORDER — SODIUM CHLORIDE 0.9 % IV SOLN: 200.0000 mg | Freq: Once | INTRAVENOUS | Status: DC

## 2021-08-10 MED ORDER — SODIUM CHLORIDE 0.9 % IV SOLN
Freq: Once | INTRAVENOUS | Status: AC
  Filled 2021-08-10: qty 250

## 2021-08-10 MED ORDER — CYANOCOBALAMIN 1000 MCG/ML IJ SOLN
1000.0000 ug | Freq: Once | INTRAMUSCULAR | Status: AC
  Administered 2021-08-10: 1000 ug via INTRAMUSCULAR
  Filled 2021-08-10: qty 1

## 2021-08-15 ENCOUNTER — Other Ambulatory Visit: Payer: Self-pay

## 2021-08-15 ENCOUNTER — Ambulatory Visit (INDEPENDENT_AMBULATORY_CARE_PROVIDER_SITE_OTHER): Payer: Medicaid Other | Admitting: Obstetrics and Gynecology

## 2021-08-15 ENCOUNTER — Encounter: Payer: Self-pay | Admitting: Obstetrics and Gynecology

## 2021-08-15 VITALS — BP 108/70 | Ht 60.0 in | Wt 154.0 lb

## 2021-08-15 DIAGNOSIS — O0993 Supervision of high risk pregnancy, unspecified, third trimester: Secondary | ICD-10-CM

## 2021-08-15 DIAGNOSIS — Z3A32 32 weeks gestation of pregnancy: Secondary | ICD-10-CM

## 2021-08-15 DIAGNOSIS — Z3A34 34 weeks gestation of pregnancy: Secondary | ICD-10-CM

## 2021-08-15 DIAGNOSIS — D696 Thrombocytopenia, unspecified: Secondary | ICD-10-CM

## 2021-08-15 DIAGNOSIS — O99119 Other diseases of the blood and blood-forming organs and certain disorders involving the immune mechanism complicating pregnancy, unspecified trimester: Secondary | ICD-10-CM

## 2021-08-15 DIAGNOSIS — O34219 Maternal care for unspecified type scar from previous cesarean delivery: Secondary | ICD-10-CM

## 2021-08-15 LAB — POCT URINALYSIS DIPSTICK OB
Glucose, UA: NEGATIVE
POC,PROTEIN,UA: NEGATIVE

## 2021-08-15 NOTE — Patient Instructions (Signed)
Cesarean Delivery Cesarean birth, or cesarean delivery, is the surgical delivery of a baby through an incision in the abdomen and the uterus. This may be referred to as a C-section. This procedure may be scheduled ahead of time, or it may be done inan emergency situation. Tell a health care provider about: Any allergies you have. All medicines you are taking, including vitamins, herbs, eye drops, creams, and over-the-counter medicines. Any problems you or family members have had with anesthetic medicines. Any blood disorders you have. Any surgeries you have had. Any medical conditions you have. Whether you or any members of your family have a history of deep vein thrombosis (DVT) or pulmonary embolism (PE). What are the risks? Generally, this is a safe procedure. However, problems may occur, including: Infection. Bleeding. Allergic reactions to medicines. Damage to other structures or organs. Blood clots. Injury to your baby. What happens before the procedure? General instructions Follow instructions from your health care provider about eating or drinking restrictions. If you know that you are going to have a cesarean delivery, do not shave your pubic area. Shaving before the procedure may increase your risk of infection. Plan to have someone take you home from the hospital. Ask your health care provider what steps will be taken to prevent infection. These may include: Removing hair at the surgery site. Washing skin with a germ-killing soap. Taking antibiotic medicine. Depending on the reason for your cesarean delivery, you may have a physical exam or additional testing, such as an ultrasound. You may have your blood or urine tested. Questions for your health care provider Ask your health care provider about: Changing or stopping your regular medicines. This is especially important if you are taking diabetes medicines or blood thinners. Your pain management plan. This is especially  important if you plan to breastfeed your baby. How long you will be in the hospital after the procedure. Any concerns you may have about receiving blood products, if you need them during the procedure. Cord blood banking, if you plan to collect your baby's umbilical cord blood. You may also want to ask your health care provider: Whether you will be able to hold or breastfeed your baby while you are still in the operating room. Whether your baby can stay with you immediately after the procedure and during your recovery. Whether a family member or a person of your choice can go with you into the operating room and stay with you during the procedure, immediately after the procedure, and during your recovery. What happens during the procedure?  An IV will be inserted into one of your veins. Fluid and medicines, such as antibiotics, will be given before the surgery. Fetal monitors will be placed on your abdomen to check your baby's heart rate. You may be given a special warming gown to wear to keep your temperature stable. A catheter may be inserted into your bladder through your urethra. This drains your urine during the procedure. You may be given one or more of the following: A medicine to numb the area (local anesthetic). A medicine to make you fall asleep (general anesthetic). A medicine (regional anesthetic) that is injected into your back or through a small thin tube placed in your back (spinal anesthetic or epidural anesthetic). This numbs everything below the injection site and allows you to stay awake during your procedure. If this makes you feel nauseous, tell your health care provider. Medicines will be available to help reduce any nausea you may feel. An incision will   be made in your abdomen, and then in your uterus. If you are awake during your procedure, you may feel tugging and pulling in your abdomen, but you should not feel pain. If you feel pain, tell your health care provider  immediately. Your baby will be removed from your uterus. You may feel more pressure or pushing while this happens. Immediately after birth, your baby will be dried and kept warm. You may be able to hold and breastfeed your baby. The umbilical cord may be clamped and cut during this time. This usually occurs after waiting a period of 1-2 minutes after delivery. Your placenta will be removed from your uterus. Your incisions will be closed with stitches (sutures). Staples, skin glue, or adhesive strips may also be applied to the incision in your abdomen. Bandages (dressings) may be placed over the incision in your abdomen. The procedure may vary among health care providers and hospitals. What happens after the procedure? Your blood pressure, heart rate, breathing rate, and blood oxygen level will be monitored until you are discharged from the hospital. You may continue to receive fluids and medicines through an IV. You will have some pain. Medicines will be available to help control your pain. To help prevent blood clots: You may be given medicines. You may have to wear compression stockings or devices. You will be encouraged to walk around when you are able. Hospital staff will encourage and support bonding with your baby. Your hospital may have you and your baby to stay in the same room (rooming in) during your hospital stay to encourage successful bonding and breastfeeding. You may be encouraged to cough and breathe deeply often. This helps to prevent lung problems. If you have a catheter draining your urine, it will be removed as soon as possible after your procedure. Summary Cesarean birth, or cesarean delivery, is the surgical delivery of a baby through an incision in the abdomen and the uterus. Follow instructions from your health care provider about eating or drinking restrictions before the procedure. You will have some pain after the procedure. Medicines will be available to help control  your pain. Hospital staff will encourage and support bonding with your baby after the procedure. Your hospital may have you and your baby to stay in the same room (rooming in) during your hospital stay to encourage successful bonding and breastfeeding. This information is not intended to replace advice given to you by your health care provider. Make sure you discuss any questions you have with your healthcare provider. Document Revised: 05/29/2020 Document Reviewed: 04/06/2018 Elsevier Patient Education  2022 [employer]  

## 2021-08-15 NOTE — Addendum Note (Signed)
Addended by: Clement Husbands A on: 08/15/2021 08:43 AM   Modules accepted: Orders

## 2021-08-15 NOTE — Progress Notes (Signed)
Routine Prenatal Care Visit  Subjective  Nicole Hartman is a 25 y.o. G3P2002 at [redacted]w[redacted]d being seen today for ongoing prenatal care.  She is currently monitored for the following issues for this high-risk pregnancy and has PTSD (post-traumatic stress disorder); MDD (major depressive disorder), recurrent episode, severe ([place]); Supervision of high risk pregnancy, antepartum; Prenatal care, subsequent pregnancy, second trimester; Nausea/vomiting in pregnancy; Thrombocytopenia affecting pregnancy, antepartum ([place]); Depressed mood; Pelvic pain in pregnancy; B12 deficiency; Absolute anemia; Anemia; and History of cesarean delivery affecting pregnancy on their problem list.  ----------------------------------------------------------------------------------- Patient reports no complaints.   Contractions: Not present. Vag. Bleeding: None.  Movement: Present. Denies leaking of fluid.  ----------------------------------------------------------------------------------- The following portions of the patient's history were reviewed and updated as appropriate: allergies, current medications, past family history, past medical history, past social history, past surgical history and problem list. Problem list updated.   Objective  Blood pressure 108/70, height 5' (1.524 m), weight 154 lb (69.9 kg), last menstrual period 12/15/2020, currently breastfeeding. Pregravid weight 133 lb (60.3 kg) Total Weight Gain 21 lb (9.526 kg) Urinalysis:      Fetal Status: Fetal Heart Rate (bpm): 14- Fundal Height: 34 cm Movement: Present     General:  Alert, oriented and cooperative. Patient is in no acute distress.  Skin: Skin is warm and dry. No rash noted.   Cardiovascular: Normal heart rate noted  Respiratory: Normal respiratory effort, no problems with respiration noted  Abdomen: Soft, gravid, appropriate for gestational age. Pain/Pressure: Present     Pelvic:  Cervical exam deferred        Extremities: Normal range  of motion.  Edema: None  Mental Status: Normal mood and affect. Normal behavior. Normal judgment and thought content.     Assessment   25 y.o. [id number] at [redacted]w[redacted]d by  09/21/2021, by Last Menstrual Period presenting for routine prenatal visit  Plan   THIRD Problems (from 02/23/21 to present)     Problem Noted Resolved   Anemia  by Rod Can, CNM No   Thrombocytopenia affecting pregnancy, antepartum ([place]) 07/10/2021 by Will Bonnet, MD No   Overview Signed 07/10/2021  1:12 PM by Will Bonnet, MD    Platelets 87 at 28 weeks Heme consult      Supervision of high risk pregnancy, antepartum 07/02/2019 by Rexene Agent, CNM No   Overview Addendum 07/18/2021  8:31 AM by Rod Can, CNM     Nursing Staff Provider  Office Location  [facility] Dating   LMP, [place] confirmed  Language  English Anatomy US    Flu Vaccine   UTD Genetic Screen  NIPS: 03/09/21  TDaP vaccine   07/18/21 Hgb A1C or  GTT Early : n/a Third trimester :   Rhogam  Not needed   LAB RESULTS   Feeding Plan  breast Blood Type O/Positive/-- (05/13 1622)   Contraception BTL or vasec Antibody Negative (05/13 1622)  Circumcision  Rubella 2.21 (05/13 1622)  Pediatrician   RPR Non Reactive (05/13 1622)   Support Person  Randall Hiss - husband HBsAg Negative (05/13 1622)   Prenatal Classes  HIV Non Reactive (05/13 1622)    Varicella IMM   BTL Consent  07/05/21 GBS  (For PCN allergy, check sensitivities)        VBAC Consent  desires R CS Pap  6/21 - NILM    Hgb Electro   normal adult hgb  Covid  Vax x2 CF      SMA  CBC today.  Growth [place]. Polyhydramnios noted before through [facility]. Patient has cesarean scheduled with Dr. Jean Rosenthal on 09/14/2021.   Gestational age appropriate obstetric precautions including but not limited to vaginal bleeding, contractions, leaking of fluid and fetal movement were reviewed in detail with the patient.    Return in about 2 weeks (around 08/29/2021) for ROB.  Natale Milch MD [facility], [facility] 08/15/2021, 8:27 AM

## 2021-08-16 LAB — CBC
Hematocrit: 34 % (ref 34.0–46.6)
Hemoglobin: 10.8 g/dL — ABNORMAL LOW (ref 11.1–15.9)
MCH: 27.8 pg (ref 26.6–33.0)
MCHC: 31.8 g/dL (ref 31.5–35.7)
MCV: 87 fL (ref 79–97)
Platelets: 168 10*3/uL (ref 150–450)
RBC: 3.89 x10E6/uL (ref 3.77–5.28)
RDW: 18 % — ABNORMAL HIGH (ref 11.7–15.4)
WBC: 5.9 10*3/uL (ref 3.4–10.8)

## 2021-08-17 ENCOUNTER — Inpatient Hospital Stay: Payer: Medicaid Other | Attending: Oncology

## 2021-08-17 ENCOUNTER — Other Ambulatory Visit: Payer: Self-pay

## 2021-08-17 DIAGNOSIS — O99013 Anemia complicating pregnancy, third trimester: Secondary | ICD-10-CM | POA: Insufficient documentation

## 2021-08-17 DIAGNOSIS — E538 Deficiency of other specified B group vitamins: Secondary | ICD-10-CM | POA: Diagnosis not present

## 2021-08-17 DIAGNOSIS — D696 Thrombocytopenia, unspecified: Secondary | ICD-10-CM | POA: Diagnosis not present

## 2021-08-17 DIAGNOSIS — O99119 Other diseases of the blood and blood-forming organs and certain disorders involving the immune mechanism complicating pregnancy, unspecified trimester: Secondary | ICD-10-CM | POA: Diagnosis present

## 2021-08-17 MED ORDER — CYANOCOBALAMIN 1000 MCG/ML IJ SOLN
1000.0000 ug | Freq: Once | INTRAMUSCULAR | Status: AC
  Administered 2021-08-17: 1000 ug via INTRAMUSCULAR
  Filled 2021-08-17: qty 1

## 2021-08-27 ENCOUNTER — Telehealth: Payer: Self-pay

## 2021-08-27 ENCOUNTER — Other Ambulatory Visit: Payer: Self-pay

## 2021-08-27 ENCOUNTER — Encounter: Payer: Self-pay | Admitting: Obstetrics and Gynecology

## 2021-08-27 ENCOUNTER — Observation Stay
Admission: EM | Admit: 2021-08-27 | Discharge: 2021-08-27 | Disposition: A | Discharge status: Home / Self Care | Payer: Medicaid Other | Attending: Advanced Practice Midwife | Admitting: Advanced Practice Midwife

## 2021-08-27 DIAGNOSIS — D696 Thrombocytopenia, unspecified: Secondary | ICD-10-CM

## 2021-08-27 DIAGNOSIS — J45909 Unspecified asthma, uncomplicated: Secondary | ICD-10-CM | POA: Insufficient documentation

## 2021-08-27 DIAGNOSIS — O99513 Diseases of the respiratory system complicating pregnancy, third trimester: Secondary | ICD-10-CM | POA: Insufficient documentation

## 2021-08-27 DIAGNOSIS — D649 Anemia, unspecified: Secondary | ICD-10-CM

## 2021-08-27 DIAGNOSIS — O0990 Supervision of high risk pregnancy, unspecified, unspecified trimester: Secondary | ICD-10-CM

## 2021-08-27 DIAGNOSIS — O26893 Other specified pregnancy related conditions, third trimester: Principal | ICD-10-CM

## 2021-08-27 DIAGNOSIS — M549 Dorsalgia, unspecified: Secondary | ICD-10-CM | POA: Diagnosis present

## 2021-08-27 DIAGNOSIS — R102 Pelvic and perineal pain: Secondary | ICD-10-CM | POA: Diagnosis not present

## 2021-08-27 DIAGNOSIS — Z3A36 36 weeks gestation of pregnancy: Secondary | ICD-10-CM

## 2021-08-27 DIAGNOSIS — O99119 Other diseases of the blood and blood-forming organs and certain disorders involving the immune mechanism complicating pregnancy, unspecified trimester: Secondary | ICD-10-CM

## 2021-08-27 DIAGNOSIS — M545 Low back pain, unspecified: Secondary | ICD-10-CM | POA: Diagnosis not present

## 2021-08-27 DIAGNOSIS — Z79899 Other long term (current) drug therapy: Secondary | ICD-10-CM | POA: Insufficient documentation

## 2021-08-27 DIAGNOSIS — O99891 Other specified diseases and conditions complicating pregnancy: Secondary | ICD-10-CM | POA: Diagnosis present

## 2021-08-27 LAB — URINALYSIS, COMPLETE (UACMP) WITH MICROSCOPIC
Bacteria, UA: NONE SEEN
Bilirubin Urine: NEGATIVE
Glucose, UA: NEGATIVE mg/dL
Hgb urine dipstick: NEGATIVE
Ketones, ur: 5 mg/dL — AB
Leukocytes,Ua: NEGATIVE
Nitrite: NEGATIVE
Protein, ur: NEGATIVE mg/dL
Specific Gravity, Urine: 1.012 (ref 1.005–1.030)
pH: 6 (ref 5.0–8.0)

## 2021-08-27 NOTE — Discharge Summary (Signed)
Physician Final Progress Note  Patient ID: Nicole Hartman MRN: [id number] DOB/AGE: January 25, 1996 25 y.o.  Admit date: 08/27/2021 Admitting provider: Rod Can, CNM Discharge date: 08/27/2021   Admission Diagnoses:  1) intrauterine pregnancy at [redacted]w[redacted]d  2) back pain  Discharge Diagnoses:  Active Problems:   Back pain affecting pregnancy in third trimester   [redacted] weeks gestation of pregnancy  Pubic symphysis pain  History of Present Illness: The patient is a 25 y.o. female G3P2002 at [redacted]w[redacted]d who presents for intermittent low back pain that started yesterday while at work. She also has mid pelvic pain when changing positions in bed. She is uncertain what contractions feel like. The pain she has occurs 2 or 3 times an hour lasting about a minute. She denies urinary pain. She denies vaginal pain, itching, irritation, or odor. She admits some yellow mucous discharge. She admits good fetal movement.   She was admitted for observation, placed on monitors and UA sent. Fetal monitoring is reassuring. Occasional contractions seen on Toco. She is able to talk continuously and does not appear to be laboring. Her cervix is closed and thick. The patient is agreeable to discharge with precautions while UA is pending.   Past Medical History:  Diagnosis Date   Anemia    Asthma    exercise induced   Cesarean delivery delivered 03/02/2016   Depression 2014   GERD (gastroesophageal reflux disease)    Headache    h/o migraines   PTSD (post-traumatic stress disorder) 2014    Past Surgical History:  Procedure Laterality Date   CESAREAN SECTION N/A 03/01/2016   Procedure: CESAREAN SECTION;  Surgeon: Will Bonnet, MD;  Location: [facility] ORS;  Service: Obstetrics;  Laterality: N/A;   CESAREAN SECTION N/A 02/11/2020   Procedure: CESAREAN SECTION;  Surgeon: Will Bonnet, MD;  Location: [facility] ORS;  Service: Obstetrics;  Laterality: N/A;  RNFA    No current facility-administered medications on file  prior to encounter.   Current Outpatient Medications on File Prior to Encounter  Medication Sig Dispense Refill   albuterol (VENTOLIN HFA) 108 (90 Base) MCG/ACT inhaler Inhale 1 puff into the lungs 3 (three) times daily as needed for wheezing (for cough).     Prenatal Vit-Fe Fumarate-FA (PNV PRENATAL PLUS MULTIVITAMIN) 27-1 MG TABS Take 1 tablet by mouth daily.      cyclobenzaprine (FLEXERIL) 5 MG tablet Take 5 mg by mouth 3 (three) times daily as needed. (Patient not taking: No sig reported)     Ferrous Sulfate (IRON) 325 (65 Fe) MG TABS Take 1 tablet by mouth daily. (Patient not taking: No sig reported)      No Known Allergies  Social History   Socioeconomic History   Marital status: Married    Spouse name: Not on file   Number of children: Not on file   Years of education: Not on file   Highest education level: Not on file  Occupational History   Not on file  Tobacco Use   Smoking status: Never   Smokeless tobacco: Never  Vaping Use   Vaping Use: Never used  Substance and Sexual Activity   Alcohol use: No   Drug use: No   Sexual activity: Yes    Partners: Male    Birth control/protection: None  Other Topics Concern   Not on file  Social History Narrative   Not on file   Social Determinants of Health   Financial Resource Strain: Not on file  Food Insecurity: Not on file  Transportation Needs: Not on file  Physical Activity: Not on file  Stress: Not on file  Social Connections: Not on file  Intimate Partner Violence: Not on file    Family History  Problem Relation Age of Onset   Breast cancer Maternal Aunt        not sure of age   Cancer - Colon Maternal Grandfather    Breast cancer Paternal Grandmother        99s     Review of Systems  Constitutional:  Negative for chills and fever.  HENT:  Negative for congestion, ear discharge, ear pain, hearing loss, sinus pain and sore throat.   Eyes:  Negative for blurred vision and double vision.  Respiratory:   Negative for cough, shortness of breath and wheezing.   Cardiovascular:  Negative for chest pain, palpitations and leg swelling.  Gastrointestinal:  Negative for abdominal pain, blood in stool, constipation, diarrhea, heartburn, melena, nausea and vomiting.  Genitourinary:  Negative for dysuria, flank pain, frequency, hematuria and urgency.       Positive for pelvic pain  Musculoskeletal:  Positive for back pain. Negative for joint pain and myalgias.  Skin:  Negative for itching and rash.  Neurological:  Negative for dizziness, tingling, tremors, sensory change, speech change, focal weakness, seizures, loss of consciousness, weakness and headaches.  Endo/Heme/Allergies:  Negative for environmental allergies. Does not bruise/bleed easily.  Psychiatric/Behavioral:  Negative for depression, hallucinations, memory loss, substance abuse and suicidal ideas. The patient is not nervous/anxious and does not have insomnia.     Physical Exam: BP 118/79 (BP Location: Left Arm)   Pulse 96   Temp 98.6 F (37 C) (Oral)   Resp 16   LMP 12/15/2020 (Exact Date)   Constitutional: Well nourished, well developed female in no acute distress.  HEENT: normal Skin: Warm and dry.  Cardiovascular: Regular rate and rhythm.   Extremity:  no edema   Respiratory: Clear to auscultation bilateral. Normal respiratory effort Abdomen: FHT present Back: no CVAT Neuro: DTRs 2+, Cranial nerves grossly intact Psych: Alert and Oriented x3. No memory deficits. Normal mood and affect.    Pelvic exam: (female chaperone present) is not limited by body habitus EGBUS: within normal limits Vagina: within normal limits and with normal mucosa, scant thin white discharge Cervix: closed/thick/high  Fetal well being: 130 bpm, moderate variability, +accelerations, -decelerations Toco: irregular uterine irritability, occasional contraction, mild to palpation  Consults: None  Significant Findings/ Diagnostic Studies: labs:  UA  pending  Procedures: NST  Hospital Course: The patient was admitted to Labor and Delivery Triage for observation.   Discharge Condition: good  Disposition: Discharge disposition: 01-Home or Self Care  Diet: Regular diet  Discharge Activity: Activity as tolerated  Discharge Instructions     Discharge activity:  No Restrictions   Complete by: As directed    Discharge diet:  No restrictions   Complete by: As directed    Notify physician for a general feeling that "something is not right"   Complete by: As directed    Notify physician for increase or change in vaginal discharge   Complete by: As directed    Notify physician for intestinal cramps, with or without diarrhea, sometimes described as "gas pain"   Complete by: As directed    Notify physician for leaking of fluid   Complete by: As directed    Notify physician for low, dull backache, unrelieved by heat or Tylenol   Complete by: As directed    Notify physician for  menstrual like cramps   Complete by: As directed    Notify physician for pelvic pressure   Complete by: As directed    Notify physician for uterine contractions.  These may be painless and feel like the uterus is tightening or the baby is  "balling up"   Complete by: As directed    Notify physician for vaginal bleeding   Complete by: As directed    PRETERM LABOR:  Includes any of the follwing symptoms that occur between 20 - [redacted] weeks gestation.  If these symptoms are not stopped, preterm labor can result in preterm delivery, placing your baby at risk    Increase hydration Tylenol PM as needed for sleep Hip support band for pubic symphysis discomfort Work note requiring regular breaks Complete by: As directed       Allergies as of 08/27/2021   No Known Allergies      Medication List     STOP taking these medications    cyclobenzaprine 5 MG tablet Commonly known as: FLEXERIL   Iron 325 (65 Fe) MG Tabs       TAKE these medications     albuterol 108 (90 Base) MCG/ACT inhaler Commonly known as: VENTOLIN HFA Inhale 1 puff into the lungs 3 (three) times daily as needed for wheezing (for cough).   PNV Prenatal Plus Multivitamin 27-1 MG Tabs Take 1 tablet by mouth daily.        [place]. Go to.   Specialty: Obstetrics and Gynecology Why: scheduled appointment Contact information: [address] [place] [id number] [phone]                Total time spent taking care of this patient: 19 minutes  Signed: Rod Can, CNM  08/27/2021, 8:56 PM

## 2021-08-27 NOTE — OB Triage Note (Signed)
Patient presented to L&D with complaints of lower back pain and pain in her perineum when she walks.  States she lost her mucous plug today and thinks she may be leaking fluid.  Reports normal fetal movement, no vaginal bleeding.

## 2021-08-27 NOTE — OB Triage Note (Signed)
Discharge instructions provided to pt. Pt verbalizes understanding. Vaginal bleeding and discharge, contractions, and fetal movement reviewed by RN. Follow-up care reviewed. Pt discharged home with significant other.  

## 2021-08-27 NOTE — Telephone Encounter (Signed)
Pt calling; has had back pain off and on since yesterday; it is starting to hurt more; has lost some of her mucus plug; is cramping; has intense pain with walking that goes around to lower back; pt is at work.  [phone]  LMTC.

## 2021-08-27 NOTE — Telephone Encounter (Signed)
Pt returned call; states the pain is so bad it's making her sweat; she is staying moist down below.  Adv to go to L&D via ED.  Heather aware.

## 2021-08-29 ENCOUNTER — Inpatient Hospital Stay: Payer: Medicaid Other

## 2021-08-29 ENCOUNTER — Encounter: Payer: Medicaid Other | Admitting: Obstetrics and Gynecology

## 2021-08-29 ENCOUNTER — Other Ambulatory Visit: Payer: Self-pay

## 2021-08-29 DIAGNOSIS — E538 Deficiency of other specified B group vitamins: Secondary | ICD-10-CM

## 2021-08-29 DIAGNOSIS — O99013 Anemia complicating pregnancy, third trimester: Secondary | ICD-10-CM | POA: Diagnosis not present

## 2021-08-29 DIAGNOSIS — D508 Other iron deficiency anemias: Secondary | ICD-10-CM

## 2021-08-29 LAB — CBC WITH DIFFERENTIAL/PLATELET
Abs Immature Granulocytes: 0.09 10*3/uL — ABNORMAL HIGH (ref 0.00–0.07)
Basophils Absolute: 0 10*3/uL (ref 0.0–0.1)
Basophils Relative: 0 %
Eosinophils Absolute: 0.1 10*3/uL (ref 0.0–0.5)
Eosinophils Relative: 1 %
HCT: 37.3 % (ref 36.0–46.0)
Hemoglobin: 11.8 g/dL — ABNORMAL LOW (ref 12.0–15.0)
Immature Granulocytes: 1 %
Lymphocytes Relative: 23 %
Lymphs Abs: 1.8 10*3/uL (ref 0.7–4.0)
MCH: 28.2 pg (ref 26.0–34.0)
MCHC: 31.6 g/dL (ref 30.0–36.0)
MCV: 89 fL (ref 80.0–100.0)
Monocytes Absolute: 0.4 10*3/uL (ref 0.1–1.0)
Monocytes Relative: 5 %
Neutro Abs: 5.4 10*3/uL (ref 1.7–7.7)
Neutrophils Relative %: 70 %
Platelets: 132 10*3/uL — ABNORMAL LOW (ref 150–400)
RBC: 4.19 MIL/uL (ref 3.87–5.11)
RDW: 17.8 % — ABNORMAL HIGH (ref 11.5–15.5)
WBC: 7.8 10*3/uL (ref 4.0–10.5)
nRBC: 0 % (ref 0.0–0.2)

## 2021-08-29 LAB — IRON AND TIBC
Iron: 98 ug/dL (ref 28–170)
Saturation Ratios: 18 % (ref 10.4–31.8)
TIBC: 543 ug/dL — ABNORMAL HIGH (ref 250–450)
UIBC: 445 ug/dL

## 2021-08-29 LAB — FERRITIN: Ferritin: 51 ng/mL (ref 11–307)

## 2021-08-29 LAB — VITAMIN B12: Vitamin B-12: 452 pg/mL (ref 180–914)

## 2021-08-31 ENCOUNTER — Other Ambulatory Visit: Payer: Self-pay

## 2021-08-31 ENCOUNTER — Ambulatory Visit: Payer: Medicaid Other | Admitting: Oncology

## 2021-08-31 ENCOUNTER — Ambulatory Visit: Payer: Medicaid Other

## 2021-08-31 ENCOUNTER — Encounter: Payer: Self-pay | Admitting: Obstetrics and Gynecology

## 2021-08-31 ENCOUNTER — Ambulatory Visit (INDEPENDENT_AMBULATORY_CARE_PROVIDER_SITE_OTHER): Payer: Medicaid Other | Admitting: Obstetrics and Gynecology

## 2021-08-31 VITALS — BP 118/74 | Wt 157.0 lb

## 2021-08-31 DIAGNOSIS — O0993 Supervision of high risk pregnancy, unspecified, third trimester: Secondary | ICD-10-CM

## 2021-08-31 DIAGNOSIS — Z3A37 37 weeks gestation of pregnancy: Secondary | ICD-10-CM

## 2021-08-31 DIAGNOSIS — O34219 Maternal care for unspecified type scar from previous cesarean delivery: Secondary | ICD-10-CM

## 2021-08-31 NOTE — Progress Notes (Signed)
Routine Prenatal Care Visit  Subjective  Nicole Hartman is a 25 y.o. G3P2002 at [redacted]w[redacted]d being seen today for ongoing prenatal care.  She is currently monitored for the following issues for this low-risk pregnancy and has PTSD (post-traumatic stress disorder); MDD (major depressive disorder), recurrent episode, severe ([facility]); Supervision of high risk pregnancy, antepartum; Prenatal care, subsequent pregnancy, second trimester; Nausea/vomiting in pregnancy; Thrombocytopenia affecting pregnancy, antepartum ([facility]); Depressed mood; Pelvic pain in pregnancy; B12 deficiency; Absolute anemia; Anemia; History of cesarean delivery affecting pregnancy; Back pain affecting pregnancy in third trimester; and [redacted] weeks gestation of pregnancy on their problem list.  ----------------------------------------------------------------------------------- Patient reports some pelvic girdle pain and occasional cramps.   Contractions: Not present. Vag. Bleeding: None.  Movement: Present. Leaking Fluid denies.  ----------------------------------------------------------------------------------- The following portions of the patient's history were reviewed and updated as appropriate: allergies, current medications, past family history, past medical history, past social history, past surgical history and problem list. Problem list updated.  Objective  Blood pressure 118/74, weight 157 lb (71.2 kg), last menstrual period 12/15/2020, currently breastfeeding. Pregravid weight 133 lb (60.3 kg) Total Weight Gain 24 lb (10.9 kg) Urinalysis: Urine Protein    Urine Glucose    Fetal Status: Fetal Heart Rate (bpm): 135 Fundal Height: 37 cm Movement: Present     General:  Alert, oriented and cooperative. Patient is in no acute distress.  Skin: Skin is warm and dry. No rash noted.   Cardiovascular: Normal heart rate noted  Respiratory: Normal respiratory effort, no problems with respiration noted  Abdomen: Soft, gravid, appropriate  for gestational age. Pain/Pressure: Present     Pelvic:  Cervical exam deferred        Extremities: Normal range of motion.  Edema: None  Mental Status: Normal mood and affect. Normal behavior. Normal judgment and thought content.   Assessment   25 y.o. [id number] at [redacted]w[redacted]d by  09/21/2021, by Last Menstrual Period presenting for routine prenatal visit  Plan   THIRD Problems (from 02/23/21 to present)     Problem Noted Resolved   [redacted] weeks gestation of pregnancy 08/27/2021 by [name], CNM No   Anemia  by [name], CNM No   Thrombocytopenia affecting pregnancy, antepartum ([facility]) 07/10/2021 by Conard Novak, MD No   Overview Signed 07/10/2021  1:12 PM by Conard Novak, MD    Platelets 87 at 28 weeks Heme consult      Supervision of high risk pregnancy, antepartum 07/02/2019 by Oswaldo Conroy, CNM No   Overview Addendum 07/18/2021  8:31 AM by [name], CNM     Nursing Staff Provider  Office Location  [facility] Dating   LMP, [place] confirmed  Language  English Anatomy US    Flu Vaccine   UTD Genetic Screen  NIPS: 03/09/21  TDaP vaccine   07/18/21 Hgb A1C or  GTT Early : n/a Third trimester :   Rhogam  Not needed   LAB RESULTS   Feeding Plan  breast Blood Type O/Positive/-- (05/13 1622)   Contraception BTL or vasec Antibody Negative (05/13 1622)  Circumcision  Rubella 2.21 (05/13 1622)  Pediatrician   RPR Non Reactive (05/13 1622)   Support Person  Minerva Areola - husband HBsAg Negative (05/13 1622)   Prenatal Classes  HIV Non Reactive (05/13 1622)    Varicella IMM   BTL Consent  07/05/21 GBS  (For PCN allergy, check sensitivities)        VBAC Consent  desires R CS Pap  6/21 - NILM  Hgb Electro   normal adult hgb  Covid  Vax x2 CF      SMA                    Term labor symptoms and general obstetric precautions including but not limited to vaginal bleeding, contractions, leaking of fluid and fetal movement were reviewed in detail with the patient. Please refer  to After Visit Summary for other counseling recommendations.   - last hgb 11.8/hct 37.3, plts 132  Return in about 4 days (around 09/04/2021) for ROB/pre-op.   Thomasene Mohair, MD, [facility], [facility] 08/31/2021 9:55 AM

## 2021-09-03 ENCOUNTER — Ambulatory Visit: Payer: Medicaid Other

## 2021-09-03 ENCOUNTER — Other Ambulatory Visit: Payer: Medicaid Other

## 2021-09-04 ENCOUNTER — Encounter: Payer: Medicaid Other | Admitting: Obstetrics and Gynecology

## 2021-09-04 ENCOUNTER — Ambulatory Visit (INDEPENDENT_AMBULATORY_CARE_PROVIDER_SITE_OTHER): Payer: Medicaid Other | Admitting: Obstetrics and Gynecology

## 2021-09-04 ENCOUNTER — Other Ambulatory Visit: Payer: Self-pay

## 2021-09-04 ENCOUNTER — Encounter: Payer: Self-pay | Admitting: Obstetrics and Gynecology

## 2021-09-04 VITALS — BP 120/80 | Ht 60.0 in | Wt 159.0 lb

## 2021-09-04 DIAGNOSIS — O99119 Other diseases of the blood and blood-forming organs and certain disorders involving the immune mechanism complicating pregnancy, unspecified trimester: Secondary | ICD-10-CM

## 2021-09-04 DIAGNOSIS — O0990 Supervision of high risk pregnancy, unspecified, unspecified trimester: Secondary | ICD-10-CM

## 2021-09-04 DIAGNOSIS — Z3A37 37 weeks gestation of pregnancy: Secondary | ICD-10-CM

## 2021-09-04 DIAGNOSIS — O34219 Maternal care for unspecified type scar from previous cesarean delivery: Secondary | ICD-10-CM

## 2021-09-04 DIAGNOSIS — D696 Thrombocytopenia, unspecified: Secondary | ICD-10-CM

## 2021-09-04 NOTE — H&P (View-Only) (Signed)
OB History & Physical   History of Present Illness:  Chief Complaint: Pre-operative visit for cesarean section  HPI:  Neah N Eilts is a 25 y.o. G3P2002 female at [redacted]w[redacted]d dated by LMP consistent with 12 week ultrasound.  Her pregnancy has been complicated by history of c-section x 2, anemia, B12 deficiency .    She denies contractions.   She denies leakage of fluid.   She denies vaginal bleeding.   She reports fetal movement.    Total weight gain for pregnancy: 24 lb (10.9 kg)   Obstetrical Problem List: THIRD Problems (from 02/23/21 to present)     Problem Noted Resolved   Anemia  by Gledhill, Jane, CNM No   Thrombocytopenia affecting pregnancy, antepartum (HCC) 07/10/2021 by Airica Schwartzkopf D, MD No   Overview Signed 07/10/2021  1:12 PM by Real Cona D, MD    Platelets 87 at 28 weeks Heme consult      Supervision of high risk pregnancy, antepartum 07/02/2019 by Schmid, Jacelyn Y, CNM No   Overview Addendum 07/18/2021  8:31 AM by Gledhill, Jane, CNM     Nursing Staff Provider  Office Location  [facility] Dating   LMP, US confirmed  Language  English Anatomy US    Flu Vaccine   UTD Genetic Screen  NIPS: 03/09/21  TDaP vaccine   07/18/21 Hgb A1C or  GTT Early : n/a Third trimester :   Rhogam  Not needed   LAB RESULTS   Feeding Plan  breast Blood Type O/Positive/-- (05/13 1622)   Contraception BTL or vasec Antibody Negative (05/13 1622)  Circumcision  Rubella 2.21 (05/13 1622)  Pediatrician   RPR Non Reactive (05/13 1622)   Support Person  Eric - husband HBsAg Negative (05/13 1622)   Prenatal Classes  HIV Non Reactive (05/13 1622)    Varicella IMM   BTL Consent  07/05/21 GBS  (For PCN allergy, check sensitivities)        VBAC Consent  desires R CS Pap  6/21 - NILM    Hgb Electro   normal adult hgb  Covid  Vax x2 CF      SMA               [redacted] weeks gestation of pregnancy 08/27/2021 by Gledhill, Jane, CNM 09/04/2021 by Vora Clover D, MD        Maternal  Medical History:   Past Medical History:  Diagnosis Date   Anemia    Asthma    exercise induced   Cesarean delivery delivered 03/02/2016   Depression 2014   GERD (gastroesophageal reflux disease)    Headache    h/o migraines   PTSD (post-traumatic stress disorder) 2014    Past Surgical History:  Procedure Laterality Date   CESAREAN SECTION N/A 03/01/2016   Procedure: CESAREAN SECTION;  Surgeon: Lore Polka D Maimuna Leaman, MD;  Location: [facility]S;  Service: Obstetrics;  Laterality: N/A;   CESAREAN SECTION N/A 02/11/2020   Procedure: CESAREAN SECTION;  Surgeon: Arriyana Rodell D, MD;  Location: [facility] ORS;  Service: Obstetrics;  Laterality: N/A;  RNFA    No Known Allergies  Prior to Admission medications   Medication Sig Start Date End Date Taking? Authorizing Provider  albuterol (VENTOLIN HFA) 108 (90 Base) MCG/ACT inhaler Inhale 2 puffs into the lungs 3 (three) times daily as needed for wheezing (for cough).   Yes [provider]  calcium carbonate (TUMS - DOSED IN MG ELEMENTAL CALCIUM) 500 MG chewable tablet Chew 1,000 tablets by mouth daily   as needed for indigestion or heartburn.   Yes [provider]  Ferrous Sulfate (IRON PO) Take 1 tablet by mouth daily.   Yes [provider]  Prenatal Vit-Fe Fumarate-FA (PNV PRENATAL PLUS MULTIVITAMIN) 27-1 MG TABS Take 1 tablet by mouth daily.    Yes [provider]    OB History  Gravida Para Term Preterm AB Living  3 2 2     2   SAB IAB Ectopic Multiple Live Births        0 2    # Outcome Date GA Lbr Len/2nd Weight Sex Delivery Anes PTL Lv  3 Current           2 Term 02/11/20 [redacted]w[redacted]d  8 lb 5.7 oz (3.79 kg) M CS-LTranv Spinal  LIV  1 Term 03/01/16 [redacted]w[redacted]d / 04:35 6 lb 14.8 oz (3.14 kg) M CS-LVertical Spinal  LIV     Complications: Failure to Progress in Second Stage    Prenatal care site: [facility]  Social History: She  reports that she has never smoked. She has never used smokeless tobacco. She reports  that she does not drink alcohol and does not use drugs.  Family History: family history includes Breast cancer in her maternal aunt and paternal grandmother; Cancer - Colon in her maternal grandfather.   Review of Systems  Constitutional: Negative.   HENT: Negative.    Eyes: Negative.   Respiratory: Negative.    Cardiovascular: Negative.   Gastrointestinal: Negative.   Genitourinary: Negative.   Musculoskeletal: Negative.   Skin: Negative.   Neurological: Negative.   Psychiatric/Behavioral: Negative.      Physical Exam:  BP 120/80   Ht 5' (1.524 m)   Wt 159 lb (72.1 kg)   LMP 12/15/2020 (Exact Date)   Breastfeeding Unknown   BMI 31.05 kg/m   Physical Exam Constitutional:      General: She is not in acute distress.    Appearance: Normal appearance. She is well-developed.  HENT:     Head: Normocephalic and atraumatic.  Eyes:     General: No scleral icterus.    Conjunctiva/sclera: Conjunctivae normal.  Cardiovascular:     Rate and Rhythm: Normal rate and regular rhythm.     Heart sounds: No murmur heard.   No friction rub. No gallop.  Pulmonary:     Effort: Pulmonary effort is normal. No respiratory distress.     Breath sounds: Normal breath sounds. No wheezing or rales.  Abdominal:     General: Bowel sounds are normal. There is no distension.     Palpations: Abdomen is soft. There is no mass.     Tenderness: There is no abdominal tenderness. There is no guarding or rebound.  Musculoskeletal:        General: Normal range of motion.     Cervical back: Normal range of motion and neck supple.  Neurological:     General: No focal deficit present.     Mental Status: She is alert and oriented to person, place, and time.     Cranial Nerves: No cranial nerve deficit.  Skin:    General: Skin is warm and dry.     Findings: No erythema.  Psychiatric:        Mood and Affect: Mood normal.        Behavior: Behavior normal.        Judgment: Judgment normal.    FHR: 155  beats/min    Fundal height: 37 cm   Lab Results  Component  Value Date   SARSCOV2NAA Detected (A) 10/22/2020    Assessment:  Anis N Sayres is a 25 y.o. G3P2002 female at [redacted]w[redacted]d with history of c-section, desires repeat. Desires permanent sterilization.   Plan:  Admit to Labor & Delivery  CBC, T&S, NPO, IVF Consent reviewed and signed today. She would still like to proceed with BTL. MCD consent signed 07/05/21 25 y.o. G3P2002  with undesired fertility, desires permanent sterilization.  Other reversible forms of contraception were discussed with patient; she declines all other modalities. Permanent nature of as well as associated risks of the procedure discussed with patient including but not limited to: risk of regret, permanence of method, bleeding, infection, injury to surrounding organs and need for additional procedures.  Failure risk of 0.5-1% with increased risk of ectopic gestation if pregnancy occurs was also discussed with patient.      Tycen Dockter, MD 09/04/2021 4:23 PM    

## 2021-09-04 NOTE — Progress Notes (Signed)
OB History & Physical   History of Present Illness:  Chief Complaint: Pre-operative visit for cesarean section  HPI:  Nicole Hartman is a 25 y.o. G57P2002 female at [redacted]w[redacted]d dated by LMP consistent with 12 week ultrasound.  Her pregnancy has been complicated by history of c-section x 2, anemia, B12 deficiency .    She denies contractions.   She denies leakage of fluid.   She denies vaginal bleeding.   She reports fetal movement.    Total weight gain for pregnancy: 24 lb (10.9 kg)   Obstetrical Problem List: THIRD Problems (from 02/23/21 to present)     Problem Noted Resolved   Anemia  by [name], CNM No   Thrombocytopenia affecting pregnancy, antepartum (HCC) 07/10/2021 by Conard Novak, MD No   Overview Signed 07/10/2021  1:12 PM by Conard Novak, MD    Platelets 87 at 28 weeks Heme consult      Supervision of high risk pregnancy, antepartum 07/02/2019 by Oswaldo Conroy, CNM No   Overview Addendum 07/18/2021  8:31 AM by [name], CNM     Nursing Staff Provider  Office Location  [facility] Dating   LMP, [place] confirmed  Language  English Anatomy US    Flu Vaccine   UTD Genetic Screen  NIPS: 03/09/21  TDaP vaccine   07/18/21 Hgb A1C or  GTT Early : n/a Third trimester :   Rhogam  Not needed   LAB RESULTS   Feeding Plan  breast Blood Type O/Positive/-- (05/13 1622)   Contraception BTL or vasec Antibody Negative (05/13 1622)  Circumcision  Rubella 2.21 (05/13 1622)  Pediatrician   RPR Non Reactive (05/13 1622)   Support Person  Minerva Areola - husband HBsAg Negative (05/13 1622)   Prenatal Classes  HIV Non Reactive (05/13 1622)    Varicella IMM   BTL Consent  07/05/21 GBS  (For PCN allergy, check sensitivities)        VBAC Consent  desires R CS Pap  6/21 - NILM    Hgb Electro   normal adult hgb  Covid  Vax x2 CF      SMA               [redacted] weeks gestation of pregnancy 08/27/2021 by [name], CNM 09/04/2021 by Conard Novak, MD        Maternal  Medical History:   Past Medical History:  Diagnosis Date   Anemia    Asthma    exercise induced   Cesarean delivery delivered 03/02/2016   Depression 2014   GERD (gastroesophageal reflux disease)    Headache    h/o migraines   PTSD (post-traumatic stress disorder) 2014    Past Surgical History:  Procedure Laterality Date   CESAREAN SECTION N/A 03/01/2016   Procedure: CESAREAN SECTION;  Surgeon: Conard Novak, MD;  Location: [facility] ORS;  Service: Obstetrics;  Laterality: N/A;   CESAREAN SECTION N/A 02/11/2020   Procedure: CESAREAN SECTION;  Surgeon: Conard Novak, MD;  Location: [facility] OR[facility];  Service: Obstetrics;  Laterality: N/A;  RNFA    No Known Allergies  Prior to Admission medications   Medication Sig Start Date End Date Taking? Authorizing Provider  albuterol (VENTOLIN HFA) 108 (90 Base) MCG/ACT inhaler Inhale 2 puffs into the lungs 3 (three) times daily as needed for wheezing (for cough).   Yes [provider]  calcium carbonate (TUMS - DOSED IN MG ELEMENTAL CALCIUM) 500 MG chewable tablet Chew 1,000 tablets by mouth daily  as needed for indigestion or heartburn.   Yes [provider]  Ferrous Sulfate (IRON PO) Take 1 tablet by mouth daily.   Yes [provider]  Prenatal Vit-Fe Fumarate-FA (PNV PRENATAL PLUS MULTIVITAMIN) 27-1 MG TABS Take 1 tablet by mouth daily.    Yes [provider]    OB History  Gravida Para Term Preterm AB Living  3 2 2     2   SAB IAB Ectopic Multiple Live Births        0 2    # Outcome Date GA Lbr Len/2nd Weight Sex Delivery Anes PTL Lv  3 Current           2 Term 02/11/20 [redacted]w[redacted]d  8 lb 5.7 oz (3.79 kg) M CS-LTranv Spinal  LIV  1 Term 03/01/16 [redacted]w[redacted]d / 04:35 6 lb 14.8 oz (3.14 kg) M CS-LVertical Spinal  LIV     Complications: Failure to Progress in Second Stage    Prenatal care site: [facility]  Social History: She  reports that she has never smoked. She has never used smokeless tobacco. She reports  that she does not drink alcohol and does not use drugs.  Family History: family history includes Breast cancer in her maternal aunt and paternal grandmother; Cancer - Colon in her maternal grandfather.   Review of Systems  Constitutional: Negative.   HENT: Negative.    Eyes: Negative.   Respiratory: Negative.    Cardiovascular: Negative.   Gastrointestinal: Negative.   Genitourinary: Negative.   Musculoskeletal: Negative.   Skin: Negative.   Neurological: Negative.   Psychiatric/Behavioral: Negative.      Physical Exam:  BP 120/80   Ht 5' (1.524 m)   Wt 159 lb (72.1 kg)   LMP 12/15/2020 (Exact Date)   Breastfeeding Unknown   BMI 31.05 kg/m   Physical Exam Constitutional:      General: She is not in acute distress.    Appearance: Normal appearance. She is well-developed.  HENT:     Head: Normocephalic and atraumatic.  Eyes:     General: No scleral icterus.    Conjunctiva/sclera: Conjunctivae normal.  Cardiovascular:     Rate and Rhythm: Normal rate and regular rhythm.     Heart sounds: No murmur heard.   No friction rub. No gallop.  Pulmonary:     Effort: Pulmonary effort is normal. No respiratory distress.     Breath sounds: Normal breath sounds. No wheezing or rales.  Abdominal:     General: Bowel sounds are normal. There is no distension.     Palpations: Abdomen is soft. There is no mass.     Tenderness: There is no abdominal tenderness. There is no guarding or rebound.  Musculoskeletal:        General: Normal range of motion.     Cervical back: Normal range of motion and neck supple.  Neurological:     General: No focal deficit present.     Mental Status: She is alert and oriented to person, place, and time.     Cranial Nerves: No cranial nerve deficit.  Skin:    General: Skin is warm and dry.     Findings: No erythema.  Psychiatric:        Mood and Affect: Mood normal.        Behavior: Behavior normal.        Judgment: Judgment normal.    FHR: 155  beats/min    Fundal height: 37 cm   Lab Results  Component  Value Date   SARSCOV2NAA Detected (A) 10/22/2020    Assessment:  Nicole Hartman is a 25 y.o. G5P2002 female at [redacted]w[redacted]d with history of c-section, desires repeat. Desires permanent sterilization.   Plan:  Admit to Labor & Delivery  CBC, T&S, NPO, IVF Consent reviewed and signed today. She would still like to proceed with BTL. MCD consent signed 07/05/21 25 y.o. [id number]  with undesired fertility, desires permanent sterilization.  Other reversible forms of contraception were discussed with patient; she declines all other modalities. Permanent nature of as well as associated risks of the procedure discussed with patient including but not limited to: risk of regret, permanence of method, bleeding, infection, injury to surrounding organs and need for additional procedures.  Failure risk of 0.5-1% with increased risk of ectopic gestation if pregnancy occurs was also discussed with patient.      Thomasene Mohair, MD 09/04/2021 4:23 PM

## 2021-09-05 ENCOUNTER — Ambulatory Visit: Payer: Medicaid Other | Attending: Obstetrics and Gynecology | Admitting: *Deleted

## 2021-09-05 ENCOUNTER — Ambulatory Visit (HOSPITAL_BASED_OUTPATIENT_CLINIC_OR_DEPARTMENT_OTHER): Payer: Medicaid Other

## 2021-09-05 ENCOUNTER — Encounter: Payer: Self-pay | Admitting: *Deleted

## 2021-09-05 VITALS — BP 109/63 | HR 97

## 2021-09-05 DIAGNOSIS — O34219 Maternal care for unspecified type scar from previous cesarean delivery: Secondary | ICD-10-CM

## 2021-09-05 DIAGNOSIS — Z148 Genetic carrier of other disease: Secondary | ICD-10-CM | POA: Diagnosis not present

## 2021-09-05 DIAGNOSIS — O0993 Supervision of high risk pregnancy, unspecified, third trimester: Secondary | ICD-10-CM

## 2021-09-05 DIAGNOSIS — O99013 Anemia complicating pregnancy, third trimester: Secondary | ICD-10-CM | POA: Insufficient documentation

## 2021-09-05 DIAGNOSIS — Z3A37 37 weeks gestation of pregnancy: Secondary | ICD-10-CM | POA: Insufficient documentation

## 2021-09-05 DIAGNOSIS — Z363 Encounter for antenatal screening for malformations: Secondary | ICD-10-CM | POA: Diagnosis not present

## 2021-09-05 DIAGNOSIS — Z3689 Encounter for other specified antenatal screening: Secondary | ICD-10-CM | POA: Diagnosis not present

## 2021-09-05 DIAGNOSIS — Z3A32 32 weeks gestation of pregnancy: Secondary | ICD-10-CM

## 2021-09-05 DIAGNOSIS — D696 Thrombocytopenia, unspecified: Secondary | ICD-10-CM

## 2021-09-05 DIAGNOSIS — O0990 Supervision of high risk pregnancy, unspecified, unspecified trimester: Secondary | ICD-10-CM

## 2021-09-10 ENCOUNTER — Encounter
Admission: RE | Admit: 2021-09-10 | Discharge: 2021-09-10 | Disposition: A | Discharge status: Home / Self Care | Payer: Medicaid Other | Source: Ambulatory Visit | Attending: Obstetrics and Gynecology | Admitting: Obstetrics and Gynecology

## 2021-09-10 ENCOUNTER — Other Ambulatory Visit: Payer: Self-pay

## 2021-09-10 HISTORY — DX: Thrombocytopenia, unspecified: D69.6

## 2021-09-10 NOTE — Patient Instructions (Addendum)
Arrival Time: Please call Labor and Delivery the day before your scheduled C-Section to find out your arrival time. [phone].  Arrival: Arrive to the [facility] If your arrival time is prior to 6:00am, please call Labor and Delivery [phone] from your cell phone upon arrival and someone from L&D or security will come down to the [facility] entrance and escort you to Labor and Delivery. If your arrival time is 6:00am or later, please enter the Medical Mall and follow the greeter's instructions.   Partner: ONE dedicated partner/guest is allowed to accompany you to Labor and Delivery and into the OR. You may have TWO dedicated visitors during the remainder of your stay on the Mother/Baby Unit. Your guests may leave and return during normal visiting hours, but will have to be re-screened with each entry to the [facility] Mall. These two visitors are not allowed to switch out for a new guest at any point during your hospital stay.   Your procedure is scheduled on: Friday, December 2  REMEMBER: Instructions that are not followed completely may result in serious medical risk, up to and including death; or upon the discretion of your surgeon and anesthesiologist your surgery may need to be rescheduled.  Do not eat food after midnight the night before surgery.  No gum chewing, lozengers or hard candies.  You may however, drink CLEAR liquids up to 2 hours before you are scheduled to arrive for your surgery. Do not drink anything within 2 hours of your scheduled arrival time.  Clear liquids include: - water  - apple juice without pulp - gatorade (not RED, PURPLE, OR BLUE) - black coffee or tea (Do NOT add milk or creamers to the coffee or tea) Do NOT drink anything that is not on this list.  TAKE THESE MEDICATIONS THE MORNING OF SURGERY WITH A SIP OF WATER:  Albuterol inhaler  Use inhalers on the day of surgery and bring to the hospital.  One week prior to surgery:  Stop  Anti-inflammatories (NSAIDS) such as Advil, Aleve, Ibuprofen, Motrin, Naproxen, Naprosyn and Aspirin based products such as Excedrin, Goodys Powder, BC Powder. Stop ANY OVER THE COUNTER supplements until after surgery. You may however, continue to take Tylenol if needed for pain up until the day of surgery.  No Alcohol for 24 hours before or after surgery.  No Smoking including e-cigarettes for 24 hours prior to surgery.  No chewable tobacco products for at least 6 hours prior to surgery.  No nicotine patches on the day of surgery.  Do not use any "recreational" drugs for at least a week prior to your surgery.  Please be advised that the combination of cocaine and anesthesia may have negative outcomes, up to and including death. If you test positive for cocaine, your surgery will be cancelled.  On the morning of surgery brush your teeth with toothpaste and water, you may rinse your mouth with mouthwash if you wish. Do not swallow any toothpaste or mouthwash.  Use CHG wipes as directed on instruction sheet.  Do not wear jewelry, make-up, hairpins, clips or nail polish.  Do not wear lotions, powders, or perfumes.   Do not shave body from the neck down 48 hours prior to surgery just in case you cut yourself which could leave a site for infection.  Also, freshly shaved skin may become irritated if using the CHG soap.  Do not bring valuables to the hospital. [facility] is not responsible for any missing/lost belongings or valuables.  Notify your doctor if there is any change in your medical condition (cold, fever, infection).  Wear comfortable clothing (specific to your surgery type) to the hospital.  After surgery, you can help prevent lung complications by doing breathing exercises.  Take deep breaths and cough every 1-2 hours. Your doctor may order a device called an Incentive Spirometer to help you take deep breaths. When coughing or sneezing, hold a pillow firmly against your  incision with both hands. This is called "splinting." Doing this helps protect your incision. It also decreases belly discomfort.  Please call the Pre-admissions Testing Dept. at [phone] if you have any questions about these instructions.

## 2021-09-11 ENCOUNTER — Inpatient Hospital Stay: Payer: Medicaid Other

## 2021-09-11 ENCOUNTER — Encounter: Payer: Self-pay | Admitting: Nurse Practitioner

## 2021-09-11 ENCOUNTER — Ambulatory Visit (INDEPENDENT_AMBULATORY_CARE_PROVIDER_SITE_OTHER): Payer: Medicaid Other | Admitting: Obstetrics and Gynecology

## 2021-09-11 ENCOUNTER — Inpatient Hospital Stay (HOSPITAL_BASED_OUTPATIENT_CLINIC_OR_DEPARTMENT_OTHER): Payer: Medicaid Other | Admitting: Nurse Practitioner

## 2021-09-11 ENCOUNTER — Encounter: Payer: Self-pay | Admitting: Obstetrics and Gynecology

## 2021-09-11 VITALS — BP 103/68 | HR 90 | Resp 18

## 2021-09-11 VITALS — BP 110/70 | Wt 158.0 lb

## 2021-09-11 VITALS — BP 108/76 | HR 104 | Temp 97.8°F | Resp 20 | Wt 158.0 lb

## 2021-09-11 DIAGNOSIS — D508 Other iron deficiency anemias: Secondary | ICD-10-CM

## 2021-09-11 DIAGNOSIS — E538 Deficiency of other specified B group vitamins: Secondary | ICD-10-CM

## 2021-09-11 DIAGNOSIS — D696 Thrombocytopenia, unspecified: Secondary | ICD-10-CM

## 2021-09-11 DIAGNOSIS — Z3A38 38 weeks gestation of pregnancy: Secondary | ICD-10-CM

## 2021-09-11 DIAGNOSIS — O0990 Supervision of high risk pregnancy, unspecified, unspecified trimester: Secondary | ICD-10-CM

## 2021-09-11 DIAGNOSIS — O99119 Other diseases of the blood and blood-forming organs and certain disorders involving the immune mechanism complicating pregnancy, unspecified trimester: Secondary | ICD-10-CM

## 2021-09-11 DIAGNOSIS — O99013 Anemia complicating pregnancy, third trimester: Secondary | ICD-10-CM | POA: Diagnosis not present

## 2021-09-11 DIAGNOSIS — O34219 Maternal care for unspecified type scar from previous cesarean delivery: Secondary | ICD-10-CM

## 2021-09-11 LAB — CBC WITH DIFFERENTIAL/PLATELET
Abs Immature Granulocytes: 0.06 10*3/uL (ref 0.00–0.07)
Basophils Absolute: 0 10*3/uL (ref 0.0–0.1)
Basophils Relative: 0 %
Eosinophils Absolute: 0.1 10*3/uL (ref 0.0–0.5)
Eosinophils Relative: 1 %
HCT: 37.7 % (ref 36.0–46.0)
Hemoglobin: 12 g/dL (ref 12.0–15.0)
Immature Granulocytes: 1 %
Lymphocytes Relative: 23 %
Lymphs Abs: 1.6 10*3/uL (ref 0.7–4.0)
MCH: 28.2 pg (ref 26.0–34.0)
MCHC: 31.8 g/dL (ref 30.0–36.0)
MCV: 88.7 fL (ref 80.0–100.0)
Monocytes Absolute: 0.4 10*3/uL (ref 0.1–1.0)
Monocytes Relative: 6 %
Neutro Abs: 4.9 10*3/uL (ref 1.7–7.7)
Neutrophils Relative %: 69 %
Platelets: 191 10*3/uL (ref 150–400)
RBC: 4.25 MIL/uL (ref 3.87–5.11)
RDW: 17.2 % — ABNORMAL HIGH (ref 11.5–15.5)
WBC: 7.1 10*3/uL (ref 4.0–10.5)
nRBC: 0 % (ref 0.0–0.2)

## 2021-09-11 LAB — IRON AND TIBC
Iron: 78 ug/dL (ref 28–170)
Saturation Ratios: 15 % (ref 10.4–31.8)
TIBC: 529 ug/dL — ABNORMAL HIGH (ref 250–450)
UIBC: 451 ug/dL

## 2021-09-11 LAB — VITAMIN B12: Vitamin B-12: 298 pg/mL (ref 180–914)

## 2021-09-11 LAB — FERRITIN: Ferritin: 28 ng/mL (ref 11–307)

## 2021-09-11 MED ORDER — SODIUM CHLORIDE 0.9 % IV SOLN: 200.0000 mg | Freq: Once | INTRAVENOUS | Status: DC

## 2021-09-11 MED ORDER — CYANOCOBALAMIN 1000 MCG/ML IJ SOLN
1000.0000 ug | Freq: Once | INTRAMUSCULAR | Status: AC
  Administered 2021-09-11: 1000 ug via INTRAMUSCULAR

## 2021-09-11 MED ORDER — SODIUM CHLORIDE 0.9 % IV SOLN
Freq: Once | INTRAVENOUS | Status: AC
Start: 2021-09-11 — End: 2021-09-11
  Filled 2021-09-11: qty 250

## 2021-09-11 MED ORDER — IRON SUCROSE 20 MG/ML IV SOLN
200.0000 mg | Freq: Once | INTRAVENOUS | Status: AC
  Administered 2021-09-11: 200 mg via INTRAVENOUS

## 2021-09-11 NOTE — Patient Instructions (Signed)
[facility]   Discharge Instructions: Thank you for choosing [facility] to provide your oncology and hematology care.  If you have a lab appointment with the Cancer Center, please go directly to the Cancer Center and check in at the registration area.  We strive to give you quality time with your provider. You may need to reschedule your appointment if you arrive late (15 or more minutes).  Arriving late affects you and other patients whose appointments are after yours.  Also, if you miss three or more appointments without notifying the office, you may be dismissed from the clinic at the provider's discretion.      For prescription refill requests, have your pharmacy contact our office and allow 72 hours for refills to be completed.    Today you received the following: Venofer infusion and Vitamin B-12 injection.   BELOW ARE SYMPTOMS THAT SHOULD BE REPORTED IMMEDIATELY: *FEVER GREATER THAN 100.4 F (38 C) OR HIGHER *CHILLS OR SWEATING *NAUSEA AND VOMITING THAT IS NOT CONTROLLED WITH YOUR NAUSEA MEDICATION *UNUSUAL SHORTNESS OF BREATH *UNUSUAL BRUISING OR BLEEDING *URINARY PROBLEMS (pain or burning when urinating, or frequent urination) *BOWEL PROBLEMS (unusual diarrhea, constipation, pain near the anus) TENDERNESS IN MOUTH AND THROAT WITH OR WITHOUT PRESENCE OF ULCERS (sore throat, sores in mouth, or a toothache) UNUSUAL RASH, SWELLING OR PAIN  UNUSUAL VAGINAL DISCHARGE OR ITCHING   Items with * indicate a potential emergency and should be followed up as soon as possible or go to the Emergency Department if any problems should occur.  Should you have questions after your visit or need to cancel or reschedule your appointment, please contact [facility] [place]  [phone] and follow the prompts.  Office hours are 8:00 a.m. to 4:30 p.m. Monday - Friday. Please note that voicemails left after 4:00 p.m. may not  be returned until the following business day.  We are closed weekends and major holidays. You have access to a nurse at all times for urgent questions. Please call the main number to the clinic [phone] and follow the prompts.  For any non-urgent questions, you may also contact your provider using MyChart. We now offer e-Visits for anyone 18 and older to request care online for non-urgent symptoms. For details visit [email or web address].   Also download the MyChart app! Go to the app store, search "MyChart", open the app, select [facility], and log in with your MyChart username and password.  Due to Covid, a mask is required upon entering the hospital/clinic. If you do not have a mask, one will be given to you upon arrival. For doctor visits, patients may have 1 support person aged 18 or older with them. For treatment visits, patients cannot have anyone with them due to current Covid guidelines and our immunocompromised population.  

## 2021-09-11 NOTE — Progress Notes (Signed)
Hematology/Oncology Consult Note [facility] Telephone:([phone] [phone]  Patient Care Team: Center, [facility] as PCP - General  REFERRING PROVIDER: Center,  Comm*  CHIEF COMPLAINTS/REASON FOR VISIT: Follow up for anemia and thrombocytopenia  HISTORY OF PRESENTING ILLNESS: Nicole Hartman is a 25 y.o. female who was seen in consultation at the request of Center, [employer]* for evaluation of thrombocytopenia, and anemia.   Reviewed patient's labs done previously.  07/05/2021 Labs showed decreased platelet counts at 86,000 wbc is normal  hemoglobin 10.1 Reviewed patient's previous labs. Thrombocytopenia is new onset.  Anemia is chronic, since 2017.   Associated symptoms or signs:  Denies weight loss, fever, chills, fatigue, night sweats.  Denies hematochezia, hematuria, hematemesis, epistaxis, black tarry stool or easy bruising.  Patient reports that she has been on iron tablets, most recently 4 time per week.  She is currently pregnant, in third trimester.   INTERVAL HISTORY Nicole Hartman is a 25 y.o. female with above history who returns to clinic for labs, further evaluation, and consideration of IV iron.  She has cesarean section with bilateral tubal ligation planned with Dr. Jean Rosenthal on 09/14/2021.  She continues B12 injections.  Review of Systems  Constitutional:  Positive for fatigue. Negative for appetite change, chills and fever.  HENT:   Negative for hearing loss and voice change.   Eyes:  Negative for eye problems.  Respiratory:  Negative for chest tightness and cough.   Cardiovascular:  Negative for chest pain.  Gastrointestinal:  Negative for abdominal distention, abdominal pain and blood in stool.  Endocrine: Negative for hot flashes.  Genitourinary:  Negative for difficulty urinating and frequency.   Musculoskeletal:  Negative for arthralgias.  Skin:  Negative for itching and rash.   Neurological:  Negative for extremity weakness.  Hematological:  Negative for adenopathy.  Psychiatric/Behavioral:  Negative for confusion.    MEDICAL HISTORY:  Past Medical History:  Diagnosis Date   Anemia    Asthma    exercise induced   Cesarean delivery delivered 03/02/2016   Depression 2014   GERD (gastroesophageal reflux disease)    Headache    h/o migraines   PTSD (post-traumatic stress disorder) 2014   Thrombocytopenia (HCC)     SURGICAL HISTORY: Past Surgical History:  Procedure Laterality Date   CESAREAN SECTION N/A 03/01/2016   Procedure: CESAREAN SECTION;  Surgeon: Conard Novak, MD;  Location: [facility] ORS;  Service: Obstetrics;  Laterality: N/A;   CESAREAN SECTION N/A 02/11/2020   Procedure: CESAREAN SECTION;  Surgeon: Conard Novak, MD;  Location: [facility] ORS;  Service: Obstetrics;  Laterality: N/A;  RNFA    SOCIAL HISTORY: Social History   Socioeconomic History   Marital status: Married    Spouse name: Rolm Gala   Number of children: Not on file   Years of education: Not on file   Highest education level: Not on file  Occupational History   Not on file  Tobacco Use   Smoking status: Never   Smokeless tobacco: Never  Vaping Use   Vaping Use: Never used  Substance and Sexual Activity   Alcohol use: No   Drug use: No   Sexual activity: Yes    Partners: Male    Birth control/protection: None  Other Topics Concern   Not on file  Social History Narrative   Not on file   Social Determinants of Health   Financial Resource Strain: Not on file  Food Insecurity: Not on file  Transportation Needs: Not  on file  Physical Activity: Not on file  Stress: Not on file  Social Connections: Not on file  Intimate Partner Violence: Not on file    FAMILY HISTORY: Family History  Problem Relation Age of Onset   Breast cancer Maternal Aunt        not sure of age   65 - Colon Maternal Grandfather    Breast cancer Paternal Grandmother        29s     ALLERGIES:  has No Known Allergies.  MEDICATIONS:  Current Outpatient Medications  Medication Sig Dispense Refill   albuterol (VENTOLIN HFA) 108 (90 Base) MCG/ACT inhaler Inhale 2 puffs into the lungs 3 (three) times daily as needed for wheezing (for cough).     calcium carbonate (TUMS - DOSED IN MG ELEMENTAL CALCIUM) 500 MG chewable tablet Chew 1,000 tablets by mouth daily as needed for indigestion or heartburn.     Ferrous Sulfate (IRON PO) Take 1 tablet by mouth daily.     Prenatal Vit-Fe Fumarate-FA (PNV PRENATAL PLUS MULTIVITAMIN) 27-1 MG TABS Take 1 tablet by mouth daily.      No current facility-administered medications for this visit.    PHYSICAL EXAMINATION:  Vitals:   09/11/21 1342  BP: 108/76  Pulse: (!) 104  Resp: 20  Temp: 97.8 F (36.6 C)  SpO2: 100%   Filed Weights   09/11/21 1342  Weight: 158 lb (71.7 kg)    Physical Exam Constitutional:      General: She is not in acute distress.    Appearance: She is well-developed. She is not ill-appearing.  HENT:     Mouth/Throat:     Pharynx: No oropharyngeal exudate.  Eyes:     General: No scleral icterus. Cardiovascular:     Rate and Rhythm: Normal rate and regular rhythm.  Pulmonary:     Effort: Pulmonary effort is normal.     Breath sounds: Normal breath sounds.  Abdominal:     General: There is no distension.     Palpations: Abdomen is soft.     Tenderness: There is no abdominal tenderness. There is no rebound.     Comments: Gravid  Musculoskeletal:        General: No tenderness or deformity.     Right lower leg: No edema.     Left lower leg: No edema.  Skin:    General: Skin is warm and dry.     Coloration: Skin is not pale.  Neurological:     General: No focal deficit present.     Mental Status: She is alert and oriented to person, place, and time.     Motor: No weakness.  Psychiatric:        Mood and Affect: Mood normal.        Behavior: Behavior normal.     LABORATORY DATA:  I have  reviewed the data as listed Lab Results  Component Value Date   WBC 7.8 08/29/2021   HGB 11.8 (L) 08/29/2021   HCT 37.3 08/29/2021   MCV 89.0 08/29/2021   PLT 132 (L) 08/29/2021   No results for input(s): NA, K, CL, CO2, GLUCOSE, BUN, CREATININE, CALCIUM, GFRNONAA, GFRAA, PROT, ALBUMIN, AST, ALT, ALKPHOS, BILITOT, BILIDIR, IBILI in the last 8760 hours. Iron/TIBC/Ferritin/ %Sat    Component Value Date/Time   IRON 98 08/29/2021 1504   TIBC 543 (H) 08/29/2021 1504   FERRITIN 51 08/29/2021 1504   IRONPCTSAT 18 08/29/2021 1504      RADIOGRAPHIC STUDIES:  ASSESSMENT &  PLAN:  1. Other iron deficiency anemia   2. B12 deficiency    Iron Deficiency Anemia during Pregnancy-ferritin and iron studies are pending at time of dictation.  She is received 4 doses of IV Venofer and tolerated it well.  No anemia today or microcytosis.  Clinically symptomatic however.  We will proceed with Venofer today.  Plan to see her back 8 weeks postdelivery for lab check and she can follow-up with Dr. Tasia Catchings virtually for results. She will continue oral iron in the interim.  B12 deficiency-B12 pending today we will check intrinsic factor and antiparietal antibodies at next visit. Thrombocytopenia-resolved  RTC 8 weeks post delivery for lab only & virtual visit day or 2 later  Orders Placed This Encounter  Procedures   CBC with Differential    Standing Status:   Future    Number of Occurrences:   1    Standing Expiration Date:   09/11/2022   Vitamin B12    Standing Status:   Future    Number of Occurrences:   1    Standing Expiration Date:   09/11/2022   Ferritin    Standing Status:   Future    Number of Occurrences:   1    Standing Expiration Date:   09/11/2022   Iron and TIBC    Standing Status:   Future    Number of Occurrences:   1    Standing Expiration Date:   09/11/2022    All questions were answered. The patient knows to call the clinic with any problems questions or concerns.  Beckey Rutter,  DNP, AGNP-C [place] [facility] [phone] (clinic) 09/11/2021

## 2021-09-11 NOTE — Progress Notes (Signed)
1441- Per NP, Consuello Masse, order: proceed with administering Venofer and Vitamin B-12 injection today.

## 2021-09-11 NOTE — Progress Notes (Signed)
Routine Prenatal Care Visit  Subjective  Nicole Hartman is a 25 y.o. G3P2002 at [redacted]w[redacted]d being seen today for ongoing prenatal care.  She is currently monitored for the following issues for this low-risk pregnancy and has PTSD (post-traumatic stress disorder); MDD (major depressive disorder), recurrent episode, severe (HCC); Supervision of high risk pregnancy, antepartum; Prenatal care, subsequent pregnancy, second trimester; Nausea/vomiting in pregnancy; Thrombocytopenia affecting pregnancy, antepartum (HCC); Depressed mood; Pelvic pain in pregnancy; B12 deficiency; Absolute anemia; Anemia; History of cesarean delivery affecting pregnancy; and Back pain affecting pregnancy in third trimester on their problem list.  ----------------------------------------------------------------------------------- Patient reports no complaints.   Contractions: Not present. Vag. Bleeding: None.  Movement: Present. Leaking Fluid denies.  ----------------------------------------------------------------------------------- The following portions of the patient's history were reviewed and updated as appropriate: allergies, current medications, past family history, past medical history, past social history, past surgical history and problem list. Problem list updated.  Objective  Blood pressure 110/70, weight 158 lb (71.7 kg), last menstrual period 12/15/2020, unknown if currently breastfeeding. Pregravid weight 133 lb (60.3 kg) Total Weight Gain 25 lb (11.3 kg) Urinalysis: Urine Protein    Urine Glucose    Fetal Status: Fetal Heart Rate (bpm): 135 Fundal Height: 38 cm Movement: Present     General:  Alert, oriented and cooperative. Patient is in no acute distress.  Skin: Skin is warm and dry. No rash noted.   Cardiovascular: Normal heart rate noted  Respiratory: Normal respiratory effort, no problems with respiration noted  Abdomen: Soft, gravid, appropriate for gestational age. Pain/Pressure: Present      Pelvic:  Cervical exam deferred        Extremities: Normal range of motion.  Edema: None  Mental Status: Normal mood and affect. Normal behavior. Normal judgment and thought content.   Assessment   25 y.o. [id number] at [redacted]w[redacted]d by  09/21/2021, by Last Menstrual Period presenting for routine prenatal visit  Plan   THIRD Problems (from 02/23/21 to present)     Problem Noted Resolved   Anemia  by [name], CNM No   Thrombocytopenia affecting pregnancy, antepartum (HCC) 07/10/2021 by Conard Novak, MD No   Overview Signed 07/10/2021  1:12 PM by Conard Novak, MD    Platelets 87 at 28 weeks Heme consult      Supervision of high risk pregnancy, antepartum 07/02/2019 by Oswaldo Conroy, CNM No   Overview Addendum 07/18/2021  8:31 AM by [name], CNM     Nursing Staff Provider  Office Location  [facility] Dating   LMP, [place] confirmed  Language  English Anatomy US    Flu Vaccine   UTD Genetic Screen  NIPS: 03/09/21  TDaP vaccine   07/18/21 Hgb A1C or  GTT Early : n/a Third trimester :   Rhogam  Not needed   LAB RESULTS   Feeding Plan  breast Blood Type O/Positive/-- (05/13 1622)   Contraception BTL or vasec Antibody Negative (05/13 1622)  Circumcision  Rubella 2.21 (05/13 1622)  Pediatrician   RPR Non Reactive (05/13 1622)   Support Person  Minerva Areola - husband HBsAg Negative (05/13 1622)   Prenatal Classes  HIV Non Reactive (05/13 1622)    Varicella IMM   BTL Consent  07/05/21 GBS  (For PCN allergy, check sensitivities)        VBAC Consent  desires R CS Pap  6/21 - NILM    Hgb Electro   normal adult hgb  Covid  Vax x2 CF      SMA  [redacted] weeks gestation of pregnancy 08/27/2021 by [name], CNM 09/04/2021 by Conard Novak, MD        Term labor symptoms and general obstetric precautions including but not limited to vaginal bleeding, contractions, leaking of fluid and fetal movement were reviewed in detail with the patient. Please refer to After  Visit Summary for other counseling recommendations.   Return in about 10 days (around 09/21/2021) for Post op incision check, w MD.   Thomasene Mohair, MD, [facility], [facility] 09/11/2021 4:42 PM

## 2021-09-12 ENCOUNTER — Other Ambulatory Visit: Payer: Self-pay

## 2021-09-12 ENCOUNTER — Encounter: Payer: Self-pay | Admitting: Urgent Care

## 2021-09-12 ENCOUNTER — Other Ambulatory Visit
Admission: RE | Admit: 2021-09-12 | Discharge: 2021-09-12 | Disposition: A | Discharge status: Home / Self Care | Payer: Medicaid Other | Source: Ambulatory Visit | Attending: Obstetrics and Gynecology | Admitting: Obstetrics and Gynecology

## 2021-09-12 ENCOUNTER — Encounter: Payer: Self-pay | Admitting: Anesthesiology

## 2021-09-12 DIAGNOSIS — Z20822 Contact with and (suspected) exposure to covid-19: Secondary | ICD-10-CM | POA: Insufficient documentation

## 2021-09-12 DIAGNOSIS — Z01812 Encounter for preprocedural laboratory examination: Secondary | ICD-10-CM | POA: Diagnosis not present

## 2021-09-12 DIAGNOSIS — O0990 Supervision of high risk pregnancy, unspecified, unspecified trimester: Secondary | ICD-10-CM

## 2021-09-12 DIAGNOSIS — O0993 Supervision of high risk pregnancy, unspecified, third trimester: Secondary | ICD-10-CM | POA: Diagnosis not present

## 2021-09-12 DIAGNOSIS — O34219 Maternal care for unspecified type scar from previous cesarean delivery: Secondary | ICD-10-CM | POA: Diagnosis not present

## 2021-09-12 LAB — RAPID HIV SCREEN (HIV 1/2 AB+AG)
HIV 1/2 Antibodies: NONREACTIVE
HIV-1 P24 Antigen - HIV24: NONREACTIVE

## 2021-09-12 LAB — CBC
HCT: 36.6 % (ref 36.0–46.0)
Hemoglobin: 11.9 g/dL — ABNORMAL LOW (ref 12.0–15.0)
MCH: 29 pg (ref 26.0–34.0)
MCHC: 32.5 g/dL (ref 30.0–36.0)
MCV: 89.1 fL (ref 80.0–100.0)
Platelets: 142 10*3/uL — ABNORMAL LOW (ref 150–400)
RBC: 4.11 MIL/uL (ref 3.87–5.11)
RDW: 17.2 % — ABNORMAL HIGH (ref 11.5–15.5)
WBC: 6.1 10*3/uL (ref 4.0–10.5)
nRBC: 0 % (ref 0.0–0.2)

## 2021-09-12 LAB — TYPE AND SCREEN
ABO/RH(D): O POS
Antibody Screen: NEGATIVE
Extend sample reason: UNDETERMINED

## 2021-09-13 LAB — RPR: RPR Ser Ql: NONREACTIVE

## 2021-09-13 LAB — SARS CORONAVIRUS 2 (TAT 6-24 HRS): SARS Coronavirus 2: NEGATIVE

## 2021-09-14 ENCOUNTER — Other Ambulatory Visit: Payer: Self-pay

## 2021-09-14 ENCOUNTER — Inpatient Hospital Stay: Payer: Medicaid Other | Admitting: Registered Nurse

## 2021-09-14 ENCOUNTER — Inpatient Hospital Stay
Admission: RE | Admit: 2021-09-14 | Discharge: 2021-09-17 | DRG: 785 | Disposition: A | Discharge status: Home / Self Care | Payer: Medicaid Other | Source: Ambulatory Visit | Attending: Obstetrics and Gynecology | Admitting: Obstetrics and Gynecology

## 2021-09-14 ENCOUNTER — Encounter: Payer: Self-pay | Admitting: Obstetrics and Gynecology

## 2021-09-14 ENCOUNTER — Encounter
Admission: RE | Disposition: A | Discharge status: Home / Self Care | Payer: Self-pay | Source: Ambulatory Visit | Attending: Obstetrics and Gynecology

## 2021-09-14 ENCOUNTER — Other Ambulatory Visit: Payer: Medicaid Other

## 2021-09-14 DIAGNOSIS — O9952 Diseases of the respiratory system complicating childbirth: Secondary | ICD-10-CM | POA: Diagnosis present

## 2021-09-14 DIAGNOSIS — O99893 Other specified diseases and conditions complicating puerperium: Secondary | ICD-10-CM | POA: Diagnosis not present

## 2021-09-14 DIAGNOSIS — Z302 Encounter for sterilization: Secondary | ICD-10-CM | POA: Diagnosis not present

## 2021-09-14 DIAGNOSIS — R52 Pain, unspecified: Secondary | ICD-10-CM

## 2021-09-14 DIAGNOSIS — Z98891 History of uterine scar from previous surgery: Secondary | ICD-10-CM

## 2021-09-14 DIAGNOSIS — M79604 Pain in right leg: Secondary | ICD-10-CM | POA: Diagnosis not present

## 2021-09-14 DIAGNOSIS — O0990 Supervision of high risk pregnancy, unspecified, unspecified trimester: Secondary | ICD-10-CM

## 2021-09-14 DIAGNOSIS — Z23 Encounter for immunization: Secondary | ICD-10-CM

## 2021-09-14 DIAGNOSIS — D649 Anemia, unspecified: Secondary | ICD-10-CM

## 2021-09-14 DIAGNOSIS — Z3A39 39 weeks gestation of pregnancy: Secondary | ICD-10-CM

## 2021-09-14 DIAGNOSIS — O34219 Maternal care for unspecified type scar from previous cesarean delivery: Secondary | ICD-10-CM

## 2021-09-14 DIAGNOSIS — J45909 Unspecified asthma, uncomplicated: Secondary | ICD-10-CM | POA: Diagnosis present

## 2021-09-14 DIAGNOSIS — Z880 Allergy status to penicillin: Secondary | ICD-10-CM | POA: Diagnosis not present

## 2021-09-14 DIAGNOSIS — D696 Thrombocytopenia, unspecified: Secondary | ICD-10-CM

## 2021-09-14 DIAGNOSIS — O34211 Maternal care for low transverse scar from previous cesarean delivery: Secondary | ICD-10-CM | POA: Diagnosis not present

## 2021-09-14 SURGERY — Surgical Case
Anesthesia: Spinal

## 2021-09-14 MED ORDER — NALBUPHINE HCL 10 MG/ML IJ SOLN: 5.0000 mg | INTRAMUSCULAR | Status: DC | PRN

## 2021-09-14 MED ORDER — BUPIVACAINE HCL (PF) 0.5 % IJ SOLN
INTRAMUSCULAR | Status: DC | PRN
  Administered 2021-09-14: 10 mL

## 2021-09-14 MED ORDER — ORAL CARE MOUTH RINSE: 15.0000 mL | Freq: Once | OROMUCOSAL | Status: AC

## 2021-09-14 MED ORDER — OXYCODONE-ACETAMINOPHEN 5-325 MG PO TABS: 1.0000 | ORAL_TABLET | ORAL | Status: DC | PRN

## 2021-09-14 MED ORDER — LACTATED RINGERS IV BOLUS
500.0000 mL | Freq: Once | INTRAVENOUS | Status: AC
  Administered 2021-09-14: 500 mL via INTRAVENOUS

## 2021-09-14 MED ORDER — PRENATAL MULTIVITAMIN CH
1.0000 | ORAL_TABLET | Freq: Every day | ORAL | Status: DC
  Administered 2021-09-15 – 2021-09-17 (×3): 1 via ORAL
  Filled 2021-09-14 (×3): qty 1

## 2021-09-14 MED ORDER — DIPHENHYDRAMINE HCL 50 MG/ML IJ SOLN: 12.5000 mg | INTRAMUSCULAR | Status: DC | PRN

## 2021-09-14 MED ORDER — BUPIVACAINE 0.25 % ON-Q PUMP DUAL CATH 400 ML
400.0000 mL | INJECTION | Status: DC
  Filled 2021-09-14: qty 400

## 2021-09-14 MED ORDER — NALBUPHINE HCL 10 MG/ML IJ SOLN: 5.0000 mg | Freq: Once | INTRAMUSCULAR | Status: DC | PRN

## 2021-09-14 MED ORDER — IBUPROFEN 600 MG PO TABS
600.0000 mg | ORAL_TABLET | Freq: Four times a day (QID) | ORAL | Status: DC
  Administered 2021-09-15 – 2021-09-17 (×8): 600 mg via ORAL
  Filled 2021-09-14 (×9): qty 1

## 2021-09-14 MED ORDER — SODIUM CHLORIDE 0.9% FLUSH: 3.0000 mL | INTRAVENOUS | Status: DC | PRN

## 2021-09-14 MED ORDER — CEFAZOLIN SODIUM-DEXTROSE 2-4 GM/100ML-% IV SOLN
2.0000 g | INTRAVENOUS | Status: AC
  Administered 2021-09-14: 2 g via INTRAVENOUS
  Filled 2021-09-14: qty 100

## 2021-09-14 MED ORDER — MENTHOL 3 MG MT LOZG
1.0000 | LOZENGE | OROMUCOSAL | Status: DC | PRN
  Filled 2021-09-14: qty 9

## 2021-09-14 MED ORDER — LACTATED RINGERS IV SOLN: INTRAVENOUS | Status: DC

## 2021-09-14 MED ORDER — KETOROLAC TROMETHAMINE 30 MG/ML IJ SOLN
INTRAMUSCULAR | Status: DC | PRN
  Administered 2021-09-14: 30 mg via INTRAVENOUS

## 2021-09-14 MED ORDER — KETOROLAC TROMETHAMINE 30 MG/ML IJ SOLN: 30.0000 mg | Freq: Four times a day (QID) | INTRAMUSCULAR | Status: AC | PRN

## 2021-09-14 MED ORDER — BUPIVACAINE HCL (PF) 0.5 % IJ SOLN
5.0000 mL | Freq: Once | INTRAMUSCULAR | Status: DC
  Filled 2021-09-14: qty 10

## 2021-09-14 MED ORDER — PHENYLEPHRINE HCL (PRESSORS) 10 MG/ML IV SOLN
INTRAVENOUS | Status: DC | PRN
  Administered 2021-09-14: 80 ug via INTRAVENOUS

## 2021-09-14 MED ORDER — SCOPOLAMINE 1 MG/3DAYS TD PT72: 1.0000 | MEDICATED_PATCH | Freq: Once | TRANSDERMAL | Status: AC

## 2021-09-14 MED ORDER — SOD CITRATE-CITRIC ACID 500-334 MG/5ML PO SOLN
ORAL | Status: AC
  Administered 2021-09-14: 30 mL via ORAL
  Filled 2021-09-14: qty 15

## 2021-09-14 MED ORDER — SENNOSIDES-DOCUSATE SODIUM 8.6-50 MG PO TABS
2.0000 | ORAL_TABLET | ORAL | Status: DC
  Administered 2021-09-15 – 2021-09-16 (×2): 2 via ORAL
  Filled 2021-09-14 (×3): qty 2

## 2021-09-14 MED ORDER — EPHEDRINE SULFATE 50 MG/ML IJ SOLN
INTRAMUSCULAR | Status: DC | PRN
  Administered 2021-09-14: 10 mg via INTRAVENOUS
  Administered 2021-09-14: 5 mg via INTRAVENOUS
  Administered 2021-09-14: 10 mg via INTRAVENOUS

## 2021-09-14 MED ORDER — ALBUTEROL SULFATE (2.5 MG/3ML) 0.083% IN NEBU: 2.5000 mg | INHALATION_SOLUTION | Freq: Three times a day (TID) | RESPIRATORY_TRACT | Status: DC | PRN

## 2021-09-14 MED ORDER — KETOROLAC TROMETHAMINE 30 MG/ML IJ SOLN
30.0000 mg | Freq: Four times a day (QID) | INTRAMUSCULAR | Status: AC | PRN
  Administered 2021-09-14 – 2021-09-15 (×2): 30 mg via INTRAVENOUS
  Filled 2021-09-14 (×2): qty 1

## 2021-09-14 MED ORDER — COCONUT OIL OIL
1.0000 "application " | TOPICAL_OIL | Status: DC | PRN
  Filled 2021-09-14: qty 120

## 2021-09-14 MED ORDER — PHENYLEPHRINE HCL-NACL 20-0.9 MG/250ML-% IV SOLN
INTRAVENOUS | Status: DC | PRN
Start: 2021-09-14 — End: 2021-09-14
  Administered 2021-09-14: 40 ug/min via INTRAVENOUS

## 2021-09-14 MED ORDER — FERROUS SULFATE 325 (65 FE) MG PO TABS
325.0000 mg | ORAL_TABLET | Freq: Two times a day (BID) | ORAL | Status: DC
  Administered 2021-09-15 – 2021-09-17 (×5): 325 mg via ORAL
  Filled 2021-09-14 (×5): qty 1

## 2021-09-14 MED ORDER — METHYLERGONOVINE MALEATE 0.2 MG/ML IJ SOLN
INTRAMUSCULAR | Status: AC
  Filled 2021-09-14: qty 1

## 2021-09-14 MED ORDER — NALOXONE HCL 4 MG/10ML IJ SOLN
1.0000 ug/kg/h | INTRAVENOUS | Status: DC | PRN
  Filled 2021-09-14: qty 5

## 2021-09-14 MED ORDER — BUPIVACAINE IN DEXTROSE 0.75-8.25 % IT SOLN
INTRATHECAL | Status: DC | PRN
  Administered 2021-09-14: 1.4 mL via INTRATHECAL

## 2021-09-14 MED ORDER — OXYTOCIN-SODIUM CHLORIDE 30-0.9 UT/500ML-% IV SOLN
2.5000 [IU]/h | INTRAVENOUS | Status: AC
  Administered 2021-09-14 – 2021-09-15 (×2): 2.5 [IU]/h via INTRAVENOUS
  Filled 2021-09-14: qty 500

## 2021-09-14 MED ORDER — FENTANYL CITRATE (PF) 100 MCG/2ML IJ SOLN: INTRAMUSCULAR | Status: DC | PRN

## 2021-09-14 MED ORDER — SIMETHICONE 80 MG PO CHEW
80.0000 mg | CHEWABLE_TABLET | Freq: Three times a day (TID) | ORAL | Status: DC
  Administered 2021-09-14 – 2021-09-17 (×9): 80 mg via ORAL
  Filled 2021-09-14 (×9): qty 1

## 2021-09-14 MED ORDER — DIBUCAINE (PERIANAL) 1 % EX OINT: 1.0000 "application " | TOPICAL_OINTMENT | CUTANEOUS | Status: DC | PRN

## 2021-09-14 MED ORDER — SOD CITRATE-CITRIC ACID 500-334 MG/5ML PO SOLN: 30.0000 mL | ORAL | Status: AC

## 2021-09-14 MED ORDER — OXYCODONE HCL 5 MG PO TABS
5.0000 mg | ORAL_TABLET | ORAL | Status: DC | PRN
  Administered 2021-09-15 – 2021-09-17 (×6): 5 mg via ORAL
  Filled 2021-09-14 (×6): qty 1

## 2021-09-14 MED ORDER — FENTANYL CITRATE (PF) 100 MCG/2ML IJ SOLN
INTRAMUSCULAR | Status: AC
  Filled 2021-09-14: qty 2

## 2021-09-14 MED ORDER — ONDANSETRON HCL 4 MG/2ML IJ SOLN
INTRAMUSCULAR | Status: DC | PRN
  Administered 2021-09-14: 4 mg via INTRAVENOUS

## 2021-09-14 MED ORDER — MORPHINE SULFATE (PF) 0.5 MG/ML IJ SOLN
INTRAMUSCULAR | Status: DC | PRN
  Administered 2021-09-14: .1 mg via INTRATHECAL

## 2021-09-14 MED ORDER — EPHEDRINE 5 MG/ML INJ
INTRAVENOUS | Status: AC
  Filled 2021-09-14: qty 5

## 2021-09-14 MED ORDER — ALBUTEROL SULFATE HFA 108 (90 BASE) MCG/ACT IN AERS: 2.0000 | INHALATION_SPRAY | Freq: Three times a day (TID) | RESPIRATORY_TRACT | Status: DC | PRN

## 2021-09-14 MED ORDER — CHLORHEXIDINE GLUCONATE 0.12 % MT SOLN
15.0000 mL | Freq: Once | OROMUCOSAL | Status: AC
  Administered 2021-09-14: 15 mL via OROMUCOSAL
  Filled 2021-09-14: qty 15

## 2021-09-14 MED ORDER — DIPHENHYDRAMINE HCL 25 MG PO CAPS: 25.0000 mg | ORAL_CAPSULE | Freq: Four times a day (QID) | ORAL | Status: DC | PRN

## 2021-09-14 MED ORDER — KETOROLAC TROMETHAMINE 30 MG/ML IJ SOLN
INTRAMUSCULAR | Status: AC
  Filled 2021-09-14: qty 1

## 2021-09-14 MED ORDER — SCOPOLAMINE 1 MG/3DAYS TD PT72
MEDICATED_PATCH | TRANSDERMAL | Status: AC
  Administered 2021-09-14: 1.5 mg via TRANSDERMAL
  Filled 2021-09-14: qty 1

## 2021-09-14 MED ORDER — MISOPROSTOL 200 MCG PO TABS
ORAL_TABLET | ORAL | Status: AC
  Filled 2021-09-14: qty 5

## 2021-09-14 MED ORDER — OXYTOCIN-SODIUM CHLORIDE 30-0.9 UT/500ML-% IV SOLN
INTRAVENOUS | Status: DC | PRN
  Administered 2021-09-14: 30 [IU] via INTRAVENOUS

## 2021-09-14 MED ORDER — ONDANSETRON HCL 4 MG/2ML IJ SOLN: 4.0000 mg | Freq: Three times a day (TID) | INTRAMUSCULAR | Status: DC | PRN

## 2021-09-14 MED ORDER — 0.9 % SODIUM CHLORIDE (POUR BTL) OPTIME
TOPICAL | Status: DC | PRN
  Administered 2021-09-14: 450 mL

## 2021-09-14 MED ORDER — OXYCODONE HCL 5 MG PO TABS
ORAL_TABLET | ORAL | Status: AC
  Administered 2021-09-14: 5 mg via ORAL
  Filled 2021-09-14: qty 1

## 2021-09-14 MED ORDER — MORPHINE SULFATE (PF) 0.5 MG/ML IJ SOLN
INTRAMUSCULAR | Status: AC
  Filled 2021-09-14: qty 10

## 2021-09-14 MED ORDER — FENTANYL CITRATE (PF) 100 MCG/2ML IJ SOLN
INTRAMUSCULAR | Status: DC | PRN
  Administered 2021-09-14: 15 ug via INTRATHECAL
  Administered 2021-09-14: 50 ug via INTRATHECAL
  Administered 2021-09-14: 35 ug via INTRATHECAL

## 2021-09-14 MED ORDER — OXYTOCIN-SODIUM CHLORIDE 30-0.9 UT/500ML-% IV SOLN
INTRAVENOUS | Status: AC
  Administered 2021-09-14: 2.5 [IU]/h via INTRAVENOUS
  Filled 2021-09-14: qty 1000

## 2021-09-14 MED ORDER — OXYCODONE HCL 5 MG PO TABS: 5.0000 mg | ORAL_TABLET | ORAL | Status: DC | PRN

## 2021-09-14 MED ORDER — NALOXONE HCL 0.4 MG/ML IJ SOLN: 0.4000 mg | INTRAMUSCULAR | Status: DC | PRN

## 2021-09-14 MED ORDER — ONDANSETRON HCL 4 MG/2ML IJ SOLN
INTRAMUSCULAR | Status: AC
  Filled 2021-09-14: qty 2

## 2021-09-14 MED ORDER — DIPHENHYDRAMINE HCL 25 MG PO CAPS: 25.0000 mg | ORAL_CAPSULE | ORAL | Status: DC | PRN

## 2021-09-14 MED ORDER — OXYCODONE-ACETAMINOPHEN 5-325 MG PO TABS: 2.0000 | ORAL_TABLET | ORAL | Status: DC | PRN

## 2021-09-14 MED ORDER — WITCH HAZEL-GLYCERIN EX PADS: 1.0000 "application " | MEDICATED_PAD | CUTANEOUS | Status: DC | PRN

## 2021-09-14 MED ORDER — ACETAMINOPHEN 500 MG PO TABS
ORAL_TABLET | ORAL | Status: AC
  Administered 2021-09-14: 1000 mg via ORAL
  Filled 2021-09-14: qty 2

## 2021-09-14 MED ORDER — ACETAMINOPHEN 500 MG PO TABS
1000.0000 mg | ORAL_TABLET | Freq: Four times a day (QID) | ORAL | Status: AC
  Administered 2021-09-14 – 2021-09-15 (×3): 1000 mg via ORAL
  Filled 2021-09-14 (×3): qty 2

## 2021-09-14 SURGICAL SUPPLY — 35 items
CATH KIT ON-Q SILVERSOAK 5 (CATHETERS) ×2 IMPLANT
CATH KIT ON-Q SILVERSOAK 5IN (CATHETERS) ×4 IMPLANT
DERMABOND ADVANCED (GAUZE/BANDAGES/DRESSINGS) ×3
DERMABOND ADVANCED .7 DNX12 (GAUZE/BANDAGES/DRESSINGS) ×1 IMPLANT
DRSG IV TEGADERM 3.5X4.5 STRL (GAUZE/BANDAGES/DRESSINGS) ×1 IMPLANT
DRSG OPSITE POSTOP 4X10 (GAUZE/BANDAGES/DRESSINGS) ×2 IMPLANT
DRSG TELFA 3X8 NADH (GAUZE/BANDAGES/DRESSINGS) ×2 IMPLANT
ELECT CAUTERY BLADE 6.4 (BLADE) ×2 IMPLANT
ELECT REM PT RETURN 9FT ADLT (ELECTROSURGICAL) ×2
ELECTRODE REM PT RTRN 9FT ADLT (ELECTROSURGICAL) ×1 IMPLANT
EXTRACTOR VACUUM KIWI (MISCELLANEOUS) ×1 IMPLANT
GAUZE SPONGE 4X4 12PLY STRL (GAUZE/BANDAGES/DRESSINGS) ×2 IMPLANT
GLOVE SURG ENC MOIS LTX SZ7 (GLOVE) ×2 IMPLANT
GLOVE SURG UNDER LTX SZ7.5 (GLOVE) ×2 IMPLANT
GOWN STRL REUS W/ TWL LRG LVL3 (GOWN DISPOSABLE) ×3 IMPLANT
GOWN STRL REUS W/TWL LRG LVL3 (GOWN DISPOSABLE) ×3
MANIFOLD NEPTUNE II (INSTRUMENTS) ×2 IMPLANT
MAT PREVALON FULL STRYKER (MISCELLANEOUS) ×2 IMPLANT
NS IRRIG 1000ML POUR BTL (IV SOLUTION) ×2 IMPLANT
PACK C SECTION AR (MISCELLANEOUS) ×2 IMPLANT
PAD DRESSING TELFA 3X8 NADH (GAUZE/BANDAGES/DRESSINGS) ×1 IMPLANT
PAD OB MATERNITY 4.3X12.25 (PERSONAL CARE ITEMS) ×4 IMPLANT
PAD PREP 24X41 OB/GYN DISP (PERSONAL CARE ITEMS) ×2 IMPLANT
PENCIL SMOKE EVACUATOR (MISCELLANEOUS) ×2 IMPLANT
SCRUB EXIDINE 4% CHG 4OZ (MISCELLANEOUS) ×2 IMPLANT
STRIP CLOSURE SKIN 1/2X4 (GAUZE/BANDAGES/DRESSINGS) ×2 IMPLANT
SUT MNCRL 4-0 (SUTURE) ×2
SUT MNCRL 4-0 27XMFL (SUTURE) ×2
SUT PDS AB 1 TP1 96 (SUTURE) ×2 IMPLANT
SUT PLAIN GUT 0 (SUTURE) ×1 IMPLANT
SUT VIC AB 0 CTX 36 (SUTURE) ×2
SUT VIC AB 0 CTX36XBRD ANBCTRL (SUTURE) ×2 IMPLANT
SUTURE MNCRL 4-0 27XMF (SUTURE) ×1 IMPLANT
SWABSTK COMLB BENZOIN TINCTURE (MISCELLANEOUS) ×2 IMPLANT
WATER STERILE IRR 500ML POUR (IV SOLUTION) ×2 IMPLANT

## 2021-09-14 NOTE — Interval H&P Note (Signed)
History and Physical Interval Note:  09/14/2021 12:29 PM  Nicole Hartman  has presented today for surgery, with the diagnosis of CESAREAN SECTION WITH BILATERAL TUBAL LIGATION.  The various methods of treatment have been discussed with the patient and family. After consideration of risks, benefits and other options for treatment, the patient has consented to  Procedure(s): CESAREAN SECTION WITH BILATERAL TUBAL LIGATION (N/A) as a surgical intervention.  The patient's history has been reviewed, patient examined, no change in status, stable for surgery.  I have reviewed the patient's chart and labs.  Questions were answered to the patient's satisfaction.     Thomasene Mohair, MD, [facility], [facility] 09/14/2021 12:29 PM

## 2021-09-14 NOTE — Op Note (Signed)
Cesarean Section Operative Note    Patient Name: Nicole Hartman  MRN: [id number]  Date of Surgery: 09/14/2021   Pre-operative Diagnosis: 1) History of cesarean delivery, desires repeat 2) Desires permanent sterilization 3) intrauterine pregnancy at [redacted]w[redacted]d   Post-operative Diagnosis:  1) History of cesarean delivery, desires repeat 2) Desires permanent sterilization 3) intrauterine pregnancy at [redacted]w[redacted]d    Procedure:  1) Repeat low transverse cesarean section via Pfannenstiel incision with double layer uterine closure 2) Bilateral tubal ligation using Pomeroy method  Surgeon: Surgeon(s) and Role:    * Conard Novak, MD - Primary   Assistants: [name], CNM; No other capable assistant available, in surgery requiring high level assistant.  Anesthesia: spinal   Findings:  1) normal appearing gravid uterus, fallopian tubes, and ovaries 2) viable female infant with weight of 3,240 grams, APGARs 8 and 9   Quantified Blood Loss: 660 mL  Total IV Fluids: 1,100 ml   Urine Output:  200 mL clear urine at end of case  Specimens: portion of bilateral fallopian tubes  Complications: no complications  Disposition: PACU - hemodynamically stable.   Maternal Condition: stable   Baby condition / location:  Couplet care / Skin to Skin  Procedure Details:  The patient was seen in the Holding Room. The risks, benefits, complications, treatment options, and expected outcomes were discussed with the patient. The patient concurred with the proposed plan, giving informed consent. identified as Nicole Hartman and the procedure verified as C-Section Delivery. A Time Out was held and the above information confirmed.   After induction of anesthesia, the patient was draped and prepped in the usual sterile manner. A Pfannenstiel incision was made and carried down through the subcutaneous tissue to the fascia. Fascial incision was made and extended transversely. The fascia was separated  from the underlying rectus tissue superiorly and inferiorly. The peritoneum was identified and entered. Peritoneal incision was extended longitudinally. The bladder flap was not bluntly or sharply freed from the lower uterine segment. A low transverse uterine incision was made and the hysterotomy was extended with cranial-caudal tension. Delivered using a vacuum due to high lie of baby from cephalic presentation was a 3,240 gram Living newborn infant(s) or Female with Apgar scores of 8 at one minute and 9 at five minutes. Cord ph was not sent the umbilical cord was clamped and cut cord blood was obtained for evaluation. The placenta was removed Intact and appeared normal. The uterine outline, tubes and ovaries appeared normal. The uterine incision was closed with running locked sutures of 0 Vicryl.  A second layer of the same suture was thrown in an imbricating fashion.  Hemostasis was assured.    The tubal ligation portion of the procedure was performed at this point.  The left fallopian tube was identified and followed out to the fimbriated end.  A Babcock clamp was used to grasp the tube in the mid-isthmic portion and two 2-0 plain gut sutures were used to ligate the tube.  An approximately 3cm segment of tube was removed with hemostasis assured.  The same procedure was performed on the right fallopian tube with hemostasis noted.   The uterus was returned to the abdomen and the paracolic gutters were cleared of all clots and debris.  The rectus muscles were inspected and found to be hemostatic.  The On-Q catheter pumps were inserted in accordance with the manufacturer's recommendations.  The catheters were inserted approximately 4cm cephelad to the incision line, approximately 1cm apart, straddling the  midline.  They were inserted to a depth of the 4th mark. They were positioned superficial to the rectus abdominus muscles and deep to the rectus fascia.    The fascia was then reapproximated with running  sutures of 1-0 PDS, looped. To reduce tension on the skin closure a running 4-0 monocryl stitch was used.  The subcuticular closure was performed using 4-0 monocryl. The skin closure was reinforced using surgical skin glue.  The On-Q catheters were bolused with 5 mL of 0.5% marcaine plain for a total of 10 mL.  The catheters were affixed to the skin with surgical skin glue, steri-strips, and tegaderm.    The surgical assistant performed tissue retraction, assistance with suturing, and fundal pressure.     Instrument, sponge, and needle counts were correct prior the abdominal closure and were correct at the conclusion of the case.  The patient received Ancef 2 gram IV prior to skin incision (within 30 minutes). For VTE prophylaxis she was wearing SCDs throughout the case.  The assistant surgeon was an MD due to lack of availability of another Sales promotion account executive.    Signed: Conard Novak, MD 09/14/2021 2:08 PM

## 2021-09-14 NOTE — Discharge Summary (Signed)
Postpartum Discharge Summary  Patient Name: Nicole Hartman DOB: 11/13/1995 MRN: [id number]  Date of admission: 09/14/2021 Delivery date:09/14/2021  Delivering provider: Prentice Docker D  Date of discharge: 09/17/2021  Admitting diagnosis: History of cesarean delivery [Z98.891] Intrauterine pregnancy: [redacted]w[redacted]d     Secondary diagnosis:  Principal Problem:   History of cesarean delivery Active Problems:   Supervision of high risk pregnancy, antepartum   Encounter for sterilization   [redacted] weeks gestation of pregnancy  Additional problems: none    Discharge diagnosis: Term Pregnancy Delivered and encounter for sterilization                                               Post partum procedures:postpartum tubal ligation as part of cesarean delivery Augmentation: N/A Complications: None  Hospital course: Sceduled C/S   25 y.o. yo G3P3003 at [redacted]w[redacted]d was admitted to the hospital 09/14/2021 for scheduled cesarean section with the following indication:Elective Repeat.Delivery details are as follows:  Membrane Rupture Time/Date: 1:13 PM ,09/14/2021   Delivery Method:C-Section, Vacuum Assisted  Details of operation can be found in separate operative note.  Patient had an uncomplicated postpartum course.  She is ambulating, tolerating a regular diet, passing flatus, and urinating well. Patient is discharged home in stable condition on  09/17/21        Newborn Data: Birth date:09/14/2021  Birth time:1:15 PM  Gender:Female  Living status:Living  Apgars:8 ,9  Weight:3240 g     Magnesium Sulfate received: No BMZ received: No Rhophylac:N/A MMR:No T-DaP:Given prenatally on 07/18/2021 Flu: no Transfusion:No  Physical exam  Vitals:   09/16/21 1702 09/16/21 2036 09/16/21 2327 09/17/21 0831  BP: 105/64 102/65 112/63 111/70  Pulse: 71 70 83 73  Resp: $Remo'18 18 18 18  'dZamJ$ Temp: 98.2 F (36.8 C) 97.7 F (36.5 C) 98 F (36.7 C) 98.4 F (36.9 C)  TempSrc: Oral Oral Oral Oral  SpO2:  99% 99% 100%  Weight:       Height:       General: alert, cooperative, and no distress Lochia: appropriate Uterine Fundus: firm Incision: Healing well with no significant drainage DVT Evaluation: No evidence of DVT seen on physical exam. Labs: Lab Results  Component Value Date   WBC 5.5 09/15/2021   HGB 9.4 (L) 09/15/2021   HCT 29.3 (L) 09/15/2021   MCV 90.4 09/15/2021   PLT 157 09/15/2021   CMP Latest Ref Rng & Units 02/09/2013  Glucose 65 - 99 mg/dL 74  BUN 9 - 21 mg/dL 12  Creatinine 0.60 - 1.30 mg/dL 0.85  Sodium 132 - 141 mmol/L 139  Potassium 3.3 - 4.7 mmol/L 3.7  Chloride 97 - 107 mmol/L 108(H)  CO2 16 - 25 mmol/L 24  Calcium 9.0 - 10.7 mg/dL 8.9(L)  Total Protein 6.4 - 8.6 g/dL 8.2  Total Bilirubin 0.2 - 1.0 mg/dL 0.2  Alkaline Phos 82 - 169 Unit/L 62(L)  AST 0 - 26 Unit/L 25  ALT 12 - 78 U/L 19   Edinburgh Score: Edinburgh Postnatal Depression Scale Screening Tool 09/14/2021  I have been able to laugh and see the funny side of things. 0  I have looked forward with enjoyment to things. 0  I have blamed myself unnecessarily when things went wrong. 0  I have been anxious or worried for no good reason. 2  I have felt scared or panicky for no good reason.  1  Things have been getting on top of me. 1  I have been so unhappy that I have had difficulty sleeping. 0  I have felt sad or miserable. 0  I have been so unhappy that I have been crying. 0  The thought of harming myself has occurred to me. 0  Edinburgh Postnatal Depression Scale Total 4      After visit meds:  Allergies as of 09/17/2021   No Known Allergies      Medication List     TAKE these medications    albuterol 108 (90 Base) MCG/ACT inhaler Commonly known as: VENTOLIN HFA Inhale 2 puffs into the lungs 3 (three) times daily as needed for wheezing (for cough).   calcium carbonate 500 MG chewable tablet Commonly known as: TUMS - dosed in mg elemental calcium Chew 2 tablets (400 mg of elemental calcium total) by mouth  daily as needed for indigestion or heartburn. What changed: how much to take   ibuprofen 600 MG tablet Commonly known as: ADVIL Take 1 tablet (600 mg total) by mouth every 6 (six) hours.   IRON PO Take 1 tablet by mouth daily.   oxyCODONE-acetaminophen 5-325 MG tablet Commonly known as: PERCOCET/ROXICET Take 1 tablet by mouth every 4 (four) hours as needed for moderate pain (pain score 4-7/10).   PNV Prenatal Plus Multivitamin 27-1 MG Tabs Take 1 tablet by mouth daily.               Discharge Care Instructions  (From admission, onward)           Start     Ordered   09/17/21 0000  Discharge wound care:       Comments: You may apply a light dressing for minor discharge from the incision or to keep waistbands of clothing from rubbing.  You may also have been discharge with a clear dressing in which case this will be removed at your postoperative clinic visit.  You may shower, use soap on your incision.  Avoid baths or soaking the incision in the first 6 weeks following your surgery.Marland Kitchen   09/17/21 0853             Discharge home in stable condition Infant Feeding: Breast Infant Disposition:home with mother Discharge instruction: per After Visit Summary and Postpartum booklet. Activity: Advance as tolerated. Pelvic rest for 6 weeks.  Diet: routine diet Anticipated Birth Control:  tubal ligation with cesarean section Postpartum Appointment:6 weeks Additional Postpartum F/U: Incision check 1 week Future Appointments: Future Appointments  Date Time Provider [place]  09/21/2021 11:30 AM Homero Fellers, MD [facility] None  11/09/2021 11:15 AM [facility] None  11/13/2021  1:15 PM Earlie Server, MD [facility] None   Follow up Visit:  Follow-up Information     Homero Fellers, MD. Go on 09/21/2021.   Specialty: Obstetrics and Gynecology Why: For incision check Contact information: [address] [place] [address] [phone]                  Gitel Beste, MD, [place], [facility] 09/17/2021, 8:54 AM

## 2021-09-14 NOTE — Transfer of Care (Signed)
Immediate Anesthesia Transfer of Care Note  Patient: Nicole Hartman  Procedure(s) Performed: CESAREAN SECTION WITH BILATERAL TUBAL LIGATION  Patient Location: PACU and Mother/Baby  Anesthesia Type:Spinal  Level of Consciousness: awake and patient cooperative  Airway & Oxygen Therapy: Patient Spontanous Breathing  Post-op Assessment: Report given to RN and Post -op Vital signs reviewed and stable  Post vital signs: Reviewed and stable  Last Vitals:  Vitals Value Taken Time  BP 122/67 1420 09/14/2021  Temp    Pulse 67   Resp 14   SpO2 100     Last Pain:  Vitals:   09/14/21 1040  TempSrc:   PainSc: 0-No pain         Complications: No notable events documented.

## 2021-09-14 NOTE — Progress Notes (Signed)
Pt arrived for scheduled C/S.  

## 2021-09-14 NOTE — Anesthesia Procedure Notes (Signed)
Spinal  Patient location during procedure: OR Start time: 09/14/2021 12:45 PM End time: 09/14/2021 12:51 PM Reason for block: surgical anesthesia Staffing Performed: resident/CRNA  Anesthesiologist: [place] [facility], Jane Canary, MD Resident/CRNA: Jerrye Noble, CRNA Preanesthetic Checklist Completed: patient identified, IV checked, site marked, risks and benefits discussed, surgical consent, monitors and equipment checked, pre-op evaluation and timeout performed Spinal Block Patient position: sitting Prep: ChloraPrep Patient monitoring: heart rate, continuous pulse ox, blood pressure and cardiac monitor Approach: midline Location: L3-4 Injection technique: single-shot Needle Needle type: Introducer and Pencan  Needle gauge: 24 G Needle length: 9 cm Assessment Sensory level: T10 Events: CSF return Additional Notes Negative paresthesia. Negative blood return. Positive free-flowing CSF. Expiration date of kit checked and confirmed. Patient tolerated procedure well, without complications.

## 2021-09-14 NOTE — Lactation Note (Signed)
This note was copied from a baby's chart. Lactation Consultation Note  Patient Name: Nicole Hartman [id number] Date: 09/14/2021 Reason for consult: L&D Initial assessment;Term Age:24 hours  Maternal Data Has patient been taught Hand Expression?: Yes Does the patient have breastfeeding experience prior to this delivery?: Yes How long did the patient breastfeed?: 6 mths  Feeding Mother's Current Feeding Choice: Breast Milk Mom states baby has been fussy and rooting, has been nursing off and on since after delivery,  baby on mom's chest, skin to skin, rooting, transistioned to left breast in cradle hold, latched easily to breast, suckin g for short periods then resting with breast in mouth, then will nurse again, mom encouraged that this is what helps make milk, reviewed feeding cues      LATCH Score Latch: Grasps breast easily, tongue down, lips flanged, rhythmical sucking.  Audible Swallowing: A few with stimulation  Type of Nipple: Everted at rest and after stimulation  Comfort (Breast/Nipple): Soft / non-tender  Hold (Positioning): Assistance needed to correctly position infant at breast and maintain latch.  LATCH Score: 8   Lactation Tools Discussed/Used    Interventions Interventions: Breast feeding basics reviewed;Assisted with latch;Skin to skin;Hand express;Support pillows;Education  Discharge Pump: Personal WIC Program: Yes  Consult Status Consult Status: PRN    Dyann Kief 09/14/2021, 5:51 PM

## 2021-09-14 NOTE — Anesthesia Preprocedure Evaluation (Signed)
Anesthesia Evaluation  Patient identified by MRN, date of birth, ID band Patient awake    Airway Mallampati: II       Dental  (+) Teeth Intact   Pulmonary asthma ,    Pulmonary exam normal        Cardiovascular negative cardio ROS   Rhythm:Regular Rate:Normal     Neuro/Psych  Headaches, Anxiety Depression    GI/Hepatic Neg liver ROS, GERD  ,  Endo/Other  negative endocrine ROS  Renal/GU negative Renal ROS  negative genitourinary   Musculoskeletal   Abdominal   Peds negative pediatric ROS (+)  Hematology  (+) anemia ,   Anesthesia Other Findings   Reproductive/Obstetrics                             Anesthesia Physical Anesthesia Plan  ASA: 2  Anesthesia Plan: Spinal   Post-op Pain Management:    Induction:   PONV Risk Score and Plan:   Airway Management Planned: Natural Airway and Nasal Cannula  Additional Equipment:   Intra-op Plan:   Post-operative Plan:   Informed Consent: I have reviewed the patients History and Physical, chart, labs and discussed the procedure including the risks, benefits and alternatives for the proposed anesthesia with the patient or authorized representative who has indicated his/her understanding and acceptance.     Dental Advisory Given  Plan Discussed with: CRNA and Surgeon  Anesthesia Plan Comments: (Patient reports no bleeding problems and no anticoagulant use.  Plan for spinal with backup GA  Patient consented for risks of anesthesia including but not limited to:  - adverse reactions to medications - damage to eyes, teeth, lips or other oral mucosa - nerve damage due to positioning  - risk of bleeding, infection and or nerve damage from spinal that could lead to paralysis - risk of headache or failed spinal - damage to teeth, lips or other oral mucosa - sore throat or hoarseness - damage to heart, brain, nerves, lungs, other parts  of body or loss of life  Patient voiced understanding.)        Anesthesia Quick Evaluation

## 2021-09-15 LAB — CBC
HCT: 29.3 % — ABNORMAL LOW (ref 36.0–46.0)
Hemoglobin: 9.4 g/dL — ABNORMAL LOW (ref 12.0–15.0)
MCH: 29 pg (ref 26.0–34.0)
MCHC: 32.1 g/dL (ref 30.0–36.0)
MCV: 90.4 fL (ref 80.0–100.0)
Platelets: 157 10*3/uL (ref 150–400)
RBC: 3.24 MIL/uL — ABNORMAL LOW (ref 3.87–5.11)
RDW: 17.4 % — ABNORMAL HIGH (ref 11.5–15.5)
WBC: 5.5 10*3/uL (ref 4.0–10.5)
nRBC: 0 % (ref 0.0–0.2)

## 2021-09-15 MED ORDER — ACETAMINOPHEN 500 MG PO TABS
1000.0000 mg | ORAL_TABLET | Freq: Four times a day (QID) | ORAL | Status: AC
  Administered 2021-09-15 – 2021-09-16 (×4): 1000 mg via ORAL
  Filled 2021-09-15 (×5): qty 2

## 2021-09-15 NOTE — Progress Notes (Signed)
Subjective: Postpartum Day 1: Cesarean Delivery Patient reports tolerating PO, + flatus, + BM, and no problems voiding.  She has some right leg pain that is making it difficult to walk. Roxicodone helps a bit, but ambulation is challenging.  Objective: Vital signs in last 24 hours: Temp:  [97.7 F (36.5 C)-99 F (37.2 C)] 99 F (37.2 C) (12/03 0857) Pulse Rate:  [67-107] 81 (12/03 0857) Resp:  [12-24] 17 (12/03 0857) BP: (77-124)/(22-79) 93/64 (12/03 0857) SpO2:  [90 %-100 %] 100 % (12/03 0857)  Physical Exam:  General: alert, cooperative, and no distress Lochia: appropriate Uterine Fundus: firm Incision: healing well, no significant drainage DVT Evaluation: No evidence of DVT seen on physical exam. Negative Homan's sign.  Recent Labs    09/15/21 0520  HGB 9.4*  HCT 29.3*    Assessment/Plan: Status post Cesarean section. Doing well postoperatively.   Continue current care Will reevaluate her right leg pain tomorrow. Mirna Mires CNM 09/15/2021, 2:16 PM

## 2021-09-15 NOTE — Progress Notes (Signed)
Right hip/thigh pain with movement since spinal per patient.  Pain and supportive measures throughout day, CNM aware.  Patient still concerned and doesn't feel like it's getting any better.  Dr. Pernell Dupre with anesthesia notified- see new orders.

## 2021-09-15 NOTE — Progress Notes (Signed)
Patient states her blood pressure is usually  "100 over 60's-70's".

## 2021-09-15 NOTE — Anesthesia Postprocedure Evaluation (Signed)
Anesthesia Post Note  Patient: Nicole Hartman  Procedure(s) Performed: CESAREAN SECTION WITH BILATERAL TUBAL LIGATION  Patient location during evaluation: Mother Baby Anesthesia Type: Spinal Level of consciousness: awake and alert Pain management: pain level controlled Vital Signs Assessment: post-procedure vital signs reviewed and stable Respiratory status: spontaneous breathing, nonlabored ventilation and respiratory function stable Cardiovascular status: stable Postop Assessment: no headache, no backache, patient able to bend at knees and able to ambulate Anesthetic complications: no Comments: Pt evaluated at 2000 and following discussion with nurse regarding residual right hip pain.  On exam, patient appears comfortable having awoken from a recent nap.  Motor exam is fully intact as is sensory exam.  Rec: continue ice q 2 hrs for likely myofacial pain.  Cont tylenol and motrin and will recheck in am.  Dr. Pernell Dupre   No notable events documented.   Last Vitals:  Vitals:   09/15/21 0857 09/15/21 1534  BP: 93/64 99/60  Pulse: 81 73  Resp: 17 18  Temp: 37.2 C 37.2 C  SpO2: 100%     Last Pain:  Vitals:   09/15/21 1542  TempSrc:   PainSc: 4                  Yevette Edwards

## 2021-09-15 NOTE — Anesthesia Postprocedure Evaluation (Signed)
Anesthesia Post Note  Patient: Nicole Hartman  Procedure(s) Performed: CESAREAN SECTION WITH BILATERAL TUBAL LIGATION  Anesthesia Type: Spinal Anesthetic complications: no   No notable events documented.   Last Vitals:  Vitals:   09/15/21 0857 09/15/21 1534  BP: 93/64 99/60  Pulse: 81 73  Resp: 17 18  Temp: 37.2 C 37.2 C  SpO2: 100%     Last Pain:  Vitals:   09/15/21 1542  TempSrc:   PainSc: 4                  Yevette Edwards

## 2021-09-16 MED ORDER — ACETAMINOPHEN 500 MG PO TABS
1000.0000 mg | ORAL_TABLET | Freq: Four times a day (QID) | ORAL | Status: DC
  Administered 2021-09-16 – 2021-09-17 (×3): 1000 mg via ORAL
  Filled 2021-09-16 (×3): qty 2

## 2021-09-16 MED ORDER — INFLUENZA VAC SPLIT QUAD 0.5 ML IM SUSY
0.5000 mL | PREFILLED_SYRINGE | INTRAMUSCULAR | Status: AC
  Administered 2021-09-17: 0.5 mL via INTRAMUSCULAR
  Filled 2021-09-16: qty 0.5

## 2021-09-16 MED ORDER — CYCLOBENZAPRINE HCL 10 MG PO TABS
10.0000 mg | ORAL_TABLET | Freq: Three times a day (TID) | ORAL | Status: DC | PRN
  Administered 2021-09-16 – 2021-09-17 (×2): 10 mg via ORAL
  Filled 2021-09-16 (×4): qty 1

## 2021-09-16 NOTE — Anesthesia Postprocedure Evaluation (Addendum)
Anesthesia Post Note  Patient: Nicole Hartman  Procedure(s) Performed: LAB --  S/p C section/SAB  Patient location during evaluation: L&D Anesthesia Type: Spinal Level of consciousness: oriented and awake and alert Pain management: pain level controlled Vital Signs Assessment: post-procedure vital signs reviewed and stable Anesthetic complications: no Comments: Rounded on pt at request of anes team subsequent to Spinal anesthetic for Primary Csection performed 09/14/21.  Pt reports persistent right lateral hip/lateral thigh pain with activity such as standing, bending, ambulating.  Pain is sharp in quality, denies burning. Pain is improved when ambulating while flexing forward.  Overall may be slightly improved form yesterday on PO analgesic regimen including izycodoe and NSAIDS. 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  Pt reported parasthesia during SAB in the same are where she has pain.  On my exam of the right thigh-  + ttp over anterior lateral proximal quad; No ecchymosis, no allodynia, no hyperpathia, no erythema or edema noted.    A-  Postprocedure pain -  Cannot rule out myofascial vs  Neuropathic  P- advised pt to continue tx and current meds.  Team may want to consider gabapentin unless neuropathic etiology is rule out.     No notable events documented.   Last Vitals:  Vitals:   09/16/21 0016 09/16/21 0823  BP: 99/63 108/74  Pulse: 75 84  Resp: 18 19  Temp: 36.8 C 36.7 C  SpO2: 96% 100%    Last Pain:  Vitals:   09/16/21 1204  TempSrc:   PainSc: 4                  Felicita Gage

## 2021-09-16 NOTE — Evaluation (Signed)
Physical Therapy Evaluation Patient Details Name: Nicole Hartman MRN: [id number] DOB: 1996-01-16 Today's Date: 09/16/2021  History of Present Illness  admitted s/p scheduled c-section for delivery of 3rd child (39 weeks, 0d); stay complicated by onset of R hip/thigh pain post resolution of spinal.  Clinical Impression  Patient resting in bed upon arrival to room; alert and oriented, agreeable to participation with session.  Endorses continued R hip/thigh pain (5/10), worsened during transitional movements, that began post-resolution of spinal.  Bilat LE strength and ROM grossly symmetrical and WFL; no focal weakness, sensory deficits appreciated.  Full myotomes and dermatomes throughout bilat LEs intact.  Does appear to have some tenderness/tightness over R IT band.  Initiated education in positioning and alignment with transitional movements; issued HEP with supine/standing stretches to area (unable to formally review, as patient nursing baby upon return).  Will follow up next for formal formal review and any additional education needed. Despite pain, patient able to complete bed mobility with mod indep; sit/stand, basic transfers and gait (40') with RW, close sup.  Demonstrates very slow, partially reciprocal stepping pattern wtih fair step height/length; tenuous advancement of R LE with reports of 'pinching' pain at times (esp when trying to stand upright).  does not feel safe without RW due to pain at this time Would benefit from skilled PT to address above deficits and promote optimal return to PLOF.; recommend transition to home with RW and outpatient PT follow up as medically appropriate.       Recommendations for follow up therapy are one component of a multi-disciplinary discharge planning process, led by the attending physician.  Recommendations may be updated based on patient status, additional functional criteria and insurance authorization.  Follow Up Recommendations Outpatient PT     Assistance Recommended at Discharge PRN  Functional Status Assessment Patient has had a recent decline in their functional status and demonstrates the ability to make significant improvements in function in a reasonable and predictable amount of time.  Equipment Recommendations  Rolling walker (2 wheels)    Recommendations for Other Services       Precautions / Restrictions Precautions Precautions: None Precaution Comments: recent c-section (09/14/21) Restrictions Weight Bearing Restrictions: No      Mobility  Bed Mobility Overal bed mobility: Modified Independent             General bed mobility comments: increased time/effort to complete    Transfers Overall transfer level: Needs assistance Equipment used: Rolling walker (2 wheels) Transfers: Sit to/from Stand Sit to Stand: Supervision                Ambulation/Gait Ambulation/Gait assistance: Supervision Gait Distance (Feet): 40 Feet Assistive device: Rolling walker (2 wheels)         General Gait Details: very slow, partially reciprocal stepping pattern wtih fair step height/length; tenuous advancement of R LE with reports of 'pinching' pain at times (esp when trying to stand upright).  does not feel safe without RW due to pain at this time  Information systems manager Rankin (Stroke Patients Only)       Balance Overall balance assessment: Needs assistance Sitting-balance support: No upper extremity supported;Feet supported Sitting balance-Leahy Scale: Good     Standing balance support: Bilateral upper extremity supported Standing balance-Leahy Scale: Fair  Pertinent Vitals/Pain Pain Assessment: 0-10 Pain Score: 5  Pain Location: R hip/thigh Pain Descriptors / Indicators: Aching;Grimacing;Guarding Pain Intervention(s): Limited activity within patient's tolerance;Monitored during session;Repositioned    Home Living  Family/patient expects to be discharged to:: Private residence Living Arrangements: Spouse/significant other;Children Available Help at Discharge: Family;Available PRN/intermittently Type of Home: House                  Prior Function Prior Level of Function : Independent/Modified Independent                     Hand Dominance        Extremity/Trunk Assessment   Upper Extremity Assessment Upper Extremity Assessment: Overall WFL for tasks assessed    Lower Extremity Assessment Lower Extremity Assessment: Overall WFL for tasks assessed (myotomes, dermatomes intact; no focal weakness or sensory deficit appreciated.  Does appear to be tender over R IT band)       Communication   Communication: No difficulties  Cognition Arousal/Alertness: Awake/alert Behavior During Therapy: WFL for tasks assessed/performed Overall Cognitive Status: Within Functional Limits for tasks assessed                                          General Comments      Exercises Other Exercises Other Exercises: Soft tissue mobilization to R IT band area x5 min for pain control Other Exercises: Educated in position of R LE for pain control (reports increased pain during transition of sit/stand)-encouraged for neutral alignment of bilat hips/kness (avoid IR/adduct) to minimize stretch to area.  Patient voiced understanding. Other Exercises: Did provide HEP with stretches for R IT band (to be performed in supine/standing as tolerated)-upon return to room, nursing baby.  Will re-attempt session next date to formally review and follow up.   Assessment/Plan    PT Assessment Patient needs continued PT services  PT Problem List Decreased strength;Decreased activity tolerance;Decreased mobility;Pain       PT Treatment Interventions DME instruction;Gait training;Stair training;Functional mobility training;Therapeutic activities;Therapeutic exercise;Balance training;Patient/family  education    PT Goals (Current goals can be found in the Care Plan section)  Acute Rehab PT Goals Patient Stated Goal: to go home PT Goal Formulation: With patient Time For Goal Achievement: 09/30/21 Potential to Achieve Goals: Good    Frequency Min 2X/week   Barriers to discharge        Co-evaluation               AM-PAC PT "6 Clicks" Mobility  Outcome Measure Help needed turning from your back to your side while in a flat bed without using bedrails?: None Help needed moving from lying on your back to sitting on the side of a flat bed without using bedrails?: None Help needed moving to and from a bed to a chair (including a wheelchair)?: None Help needed standing up from a chair using your arms (e.g., wheelchair or bedside chair)?: None Help needed to walk in hospital room?: None Help needed climbing 3-5 steps with a railing? : A Little 6 Click Score: 23    End of Session   Activity Tolerance: Patient tolerated treatment well Patient left: in bed;with call bell/phone within reach Nurse Communication: Mobility status PT Visit Diagnosis: Muscle weakness (generalized) (M62.81);Difficulty in walking, not elsewhere classified (R26.2);Pain Pain - Right/Left: Right Pain - part of body: Hip    Time: 6553-7482 PT Time  Calculation (min) (ACUTE ONLY): 26 min   Charges:   PT Evaluation $PT Eval Moderate Complexity: 1 Mod PT Treatments $Therapeutic Activity: 8-22 mins       Savita Runner H. Manson Passey, PT, DPT, NCS 09/16/21, 1:37 PM [phone]

## 2021-09-16 NOTE — Progress Notes (Signed)
Subjective: Postpartum Day 2: Cesarean Delivery Patient reports tolerating PO and no problems voiding.  She has continued having right hip and side pain, and has been seen by anesthesia and will have a visit from Physical Therapy today.She is able to walk, but has increased pain on the right side when sitting down from a standing position.  Objective: Vital signs in last 24 hours: Temp:  [98 F (36.7 C)-98.9 F (37.2 C)] 98 F (36.7 C) (12/04 0823) Pulse Rate:  [73-84] 84 (12/04 0823) Resp:  [18-19] 19 (12/04 0823) BP: (99-108)/(60-74) 108/74 (12/04 0823) SpO2:  [96 %-100 %] 100 % (12/04 7425)  Physical Exam:  General: alert, cooperative, and no distress Lochia: appropriate Uterine Fundus: firm Incision: healing well, no significant drainage DVT Evaluation: No evidence of DVT seen on physical exam. Negative Homan's sign.  Recent Labs    09/15/21 0520  HGB 9.4*  HCT 29.3*    Assessment/Plan: Status post Cesarean section. Postoperative course complicated by right side pain and difficulty with movement secondary to right sided pain.   Physical therapy today. Will try Flexeril  today to address pain.Mirna Mires 09/16/2021, 10:54 AM

## 2021-09-17 ENCOUNTER — Encounter: Payer: Self-pay | Admitting: Obstetrics and Gynecology

## 2021-09-17 MED ORDER — CALCIUM CARBONATE ANTACID 500 MG PO CHEW: 2.0000 | CHEWABLE_TABLET | Freq: Every day | ORAL | Status: DC | PRN

## 2021-09-17 MED ORDER — IBUPROFEN 600 MG PO TABS: 600.0000 mg | ORAL_TABLET | Freq: Four times a day (QID) | ORAL | 0 refills | Status: DC

## 2021-09-17 MED ORDER — OXYCODONE-ACETAMINOPHEN 5-325 MG PO TABS: 1.0000 | ORAL_TABLET | ORAL | 0 refills | Status: DC | PRN

## 2021-09-17 NOTE — Progress Notes (Addendum)
Pt. discharged home in stable condition. Maternal and child discharge instructions and follow-up care reviewed-pt.verbalized understanding. Pt. discharged home in private vehicle with support person. Pt sent home with walker per PT and MD. Pt seen by PT before discharge- cleared to go home.Pt. stable at discharge

## 2021-09-17 NOTE — TOC Transition Note (Signed)
Transition of Care [facility]) - CM/SW Discharge Note   Patient Details  Name: LATISSA FRICK MRN: [id number] Date of Birth: 05-Sep-1996  [facility] CM/SW Contact:  Allayne Butcher, RN Phone Number: 09/17/2021, 10:27 AM   Clinical Narrative:    Patient is discharging home today.  PT saw her in the hospital and is recommending OP Therapy.  Patient is interested in OP Therapy.  Informed patient to follow up with her OB or [facility] and either of those can order the OP PT.  PT recommending DME RW and patient agreeable.  [employer] will deliver RW to the patient's room.  Patient's husband will be taking her home today.  Car Seat is in the car and ready for baby.     Final next level of care: OP Rehab Barriers to Discharge: Barriers Resolved   Patient Goals and CMS Choice Patient states their goals for this hospitalization and ongoing recovery are:: Patient excited to go home with new baby      Discharge Placement                       Discharge Plan and Services   Discharge Planning Services: CM Consult            DME Arranged: Dan Humphreys rolling DME Agency: [employer] Date DME Agency Contacted: 09/17/21 Time DME Agency Contacted: 1026 Representative spoke with at DME Agency: Bjorn Loser HH Arranged: NA HH Agency: NA        Social Determinants of Health (SDOH) Interventions     Readmission Risk Interventions No flowsheet data found.

## 2021-09-17 NOTE — Progress Notes (Signed)
Physical Therapy Treatment Patient Details Name: Nicole Hartman MRN: [id number] DOB: August 15, 1996 Today's Date: 09/17/2021   History of Present Illness Admitted s/p scheduled c-section for delivery of 3rd child (39 weeks, 0d); stay complicated by onset of R hip/thigh pain post resolution of spinal.    PT Comments    Pt resting in bed upon PT arrival; pt's husband present attending to their baby.  Mild R hip pain at rest but increased up to 9/10 with activity; pain R hip decreased to 5/10 at rest end of session resting in bed (pt reporting receiving pain medication prior to session). Tenderness noted R lateral/posterior hip (pt reporting pain shooting down R lateral thigh at times with activity but not past her knee).  Modified independent with bed mobility; SBA with transfers using RW; and SBA ambulating 20 feet with RW (limited distance per therapist d/t focus on stairs training during session).  Pt able to navigate 3 steps with L railing support (to simulate back entrance of home with 2 STE L railing).  Reviewed OP PT recommendations and to contact her MD if pain/symptoms worsened upon discharge home: pt verbalizing understanding and reporting no further PT questions for discharge home.   Recommendations for follow up therapy are one component of a multi-disciplinary discharge planning process, led by the attending physician.  Recommendations may be updated based on patient status, additional functional criteria and insurance authorization.  Follow Up Recommendations  Outpatient PT     Assistance Recommended at Discharge PRN  Equipment Recommendations  Rolling walker (2 wheels)    Recommendations for Other Services       Precautions / Restrictions Precautions Precautions: None Precaution Comments: recent c-section (09/14/21) Restrictions Weight Bearing Restrictions: No     Mobility  Bed Mobility Overal bed mobility: Modified Independent             General bed mobility  comments: HOB elevated; mild increased effort/time to perform on own    Transfers Overall transfer level: Needs assistance Equipment used: Rolling walker (2 wheels) Transfers: Sit to/from Stand Sit to Stand: Supervision           General transfer comment: use of RW; steady; decreased weight noted through R LE with transfers    Ambulation/Gait Ambulation/Gait assistance: Supervision Gait Distance (Feet): 20 Feet ((distance limited per therapist d/t focus on stairs training during session)) Assistive device: Rolling walker (2 wheels)   Gait velocity: decreased     General Gait Details: slow gait; decreased stance time R LE; mildly flexed posture R hip pain; partial step through gait pattern   Stairs Stairs: Yes Stairs assistance: Min guard Stair Management: One rail Left;Step to pattern Number of Stairs: 3 General stair comments: ascended 3 steps with L railing forward; descended 1 step with railing backwards and descended 2 steps with railing forwards; initial vc's for use of railing and positioning and LE sequencing on steps and then pt able to perform without any further cueing   Wheelchair Mobility    Modified Rankin (Stroke Patients Only)       Balance Overall balance assessment: Needs assistance Sitting-balance support: No upper extremity supported;Feet supported Sitting balance-Leahy Scale: Normal Sitting balance - Comments: steady sitting reaching outside BOS   Standing balance support: No upper extremity supported Standing balance-Leahy Scale: Fair Standing balance comment: steady static standing                            Cognition Arousal/Alertness: Awake/alert Behavior  During Therapy: WFL for tasks assessed/performed Overall Cognitive Status: Within Functional Limits for tasks assessed                                          Exercises      General Comments  Nursing cleared pt for participation in physical therapy.   Pt agreeable to PT session.      Pertinent Vitals/Pain Pain Assessment: 0-10 Pain Score: 5  Pain Location: R hip/thigh Pain Intervention(s): Limited activity within patient's tolerance;Monitored during session;Premedicated before session;Repositioned    Home Living   Living Arrangements: Spouse/significant other;Children                      Prior Function            PT Goals (current goals can now be found in the care plan section) Acute Rehab PT Goals Patient Stated Goal: to go home PT Goal Formulation: With patient Time For Goal Achievement: 09/30/21 Potential to Achieve Goals: Good Progress towards PT goals: Progressing toward goals    Frequency    Min 2X/week      PT Plan Current plan remains appropriate    Co-evaluation              AM-PAC PT "6 Clicks" Mobility   Outcome Measure  Help needed turning from your back to your side while in a flat bed without using bedrails?: None Help needed moving from lying on your back to sitting on the side of a flat bed without using bedrails?: None Help needed moving to and from a bed to a chair (including a wheelchair)?: None Help needed standing up from a chair using your arms (e.g., wheelchair or bedside chair)?: None Help needed to walk in hospital room?: None Help needed climbing 3-5 steps with a railing? : A Little 6 Click Score: 23    End of Session Equipment Utilized During Treatment: Gait belt Activity Tolerance: Patient limited by pain Patient left: in bed;with call bell/phone within reach Nurse Communication: Mobility status;Precautions;Other (comment) (pt's pain status) PT Visit Diagnosis: Muscle weakness (generalized) (M62.81);Difficulty in walking, not elsewhere classified (R26.2);Pain Pain - Right/Left: Right Pain - part of body: Hip     Time: 7824-2353 PT Time Calculation (min) (ACUTE ONLY): 25 min  Charges:  $Therapeutic Activity: 23-37 mins                    Hendricks Limes,  PT 09/17/21, 12:17 PM

## 2021-09-18 LAB — SURGICAL PATHOLOGY

## 2021-09-21 ENCOUNTER — Encounter: Payer: Self-pay | Admitting: Obstetrics and Gynecology

## 2021-09-21 ENCOUNTER — Other Ambulatory Visit: Payer: Self-pay

## 2021-09-21 ENCOUNTER — Ambulatory Visit (INDEPENDENT_AMBULATORY_CARE_PROVIDER_SITE_OTHER): Payer: Medicaid Other | Admitting: Obstetrics and Gynecology

## 2021-09-21 ENCOUNTER — Other Ambulatory Visit: Payer: Self-pay | Admitting: Obstetrics and Gynecology

## 2021-09-21 DIAGNOSIS — R300 Dysuria: Secondary | ICD-10-CM

## 2021-09-21 DIAGNOSIS — R262 Difficulty in walking, not elsewhere classified: Secondary | ICD-10-CM

## 2021-09-21 DIAGNOSIS — T148XXA Other injury of unspecified body region, initial encounter: Secondary | ICD-10-CM

## 2021-09-21 LAB — POCT URINALYSIS DIPSTICK
Bilirubin, UA: NEGATIVE
Blood, UA: POSITIVE
Glucose, UA: NEGATIVE
Ketones, UA: NEGATIVE
Nitrite, UA: NEGATIVE
Protein, UA: NEGATIVE
Spec Grav, UA: >=1.03 — AB (ref 1.010–1.025)
Urobilinogen, UA: 0.2 E.U./dL
pH, UA: 5 (ref 5.0–8.0)

## 2021-09-21 MED ORDER — NITROFURANTOIN MONOHYD MACRO 100 MG PO CAPS: 100.0000 mg | ORAL_CAPSULE | Freq: Two times a day (BID) | ORAL | 0 refills | Status: DC

## 2021-09-21 NOTE — Progress Notes (Signed)
Postpartum Visit  Chief Complaint:  Chief Complaint  Patient presents with   Postpartum Care    History of Present Illness: Patient is a 25 y.o. [id number] presents for postpartum visit.  Date of delivery: 09/14/2021 Type of delivery: Cesarean Section and BTL  Post partum depression/anxiety noted:  no Edinburgh Post-Partum Depression Score: 3  Date of last PAP: 2021 NIL       Any problems since the delivery: Reports increased difficulty walking.  She was seen by physical therapy in the hospital and discharged home with a walker.  She reports that she has diminished strength on her right side.  She has numbness on her right lateral aspect of her thigh.  She reports when she wipes sometimes she will have tingling and shooting pain on her right side by her vulva wraps around the top of her thigh.  She finds standing for long periods of time challenging because she will have increasing pain and numbness and diminished strength.  Newborn Details:  SINGLETON :  1.Infant Status: Infant doing well at home with mother.   Review of Systems: Review of Systems  Constitutional:  Negative for chills, fever, malaise/fatigue and weight loss.  HENT:  Negative for congestion, hearing loss and sinus pain.   Eyes:  Negative for blurred vision and double vision.  Respiratory:  Negative for cough, sputum production, shortness of breath and wheezing.   Cardiovascular:  Negative for chest pain, palpitations, orthopnea and leg swelling.  Gastrointestinal:  Negative for abdominal pain, constipation, diarrhea, nausea and vomiting.  Genitourinary:  Negative for dysuria, flank pain, frequency, hematuria and urgency.  Musculoskeletal:  Negative for back pain, falls and joint pain.  Skin:  Negative for itching and rash.  Neurological:  Positive for tingling, sensory change, focal weakness and weakness. Negative for dizziness and headaches.  Psychiatric/Behavioral:  Negative for depression, substance abuse and  suicidal ideas. The patient is not nervous/anxious.    Past Medical History:  Past Medical History:  Diagnosis Date   Anemia    Asthma    exercise induced   Cesarean delivery delivered 03/02/2016   Depression 2014   GERD (gastroesophageal reflux disease)    Headache    h/o migraines   PTSD (post-traumatic stress disorder) 2014   Thrombocytopenia (HCC)     Past Surgical History:  Past Surgical History:  Procedure Laterality Date   CESAREAN SECTION N/A 03/01/2016   Procedure: CESAREAN SECTION;  Surgeon: Conard Novak, MD;  Location: [facility];  Service: Obstetrics;  Laterality: N/A;   CESAREAN SECTION N/A 02/11/2020   Procedure: CESAREAN SECTION;  Surgeon: Conard Novak, MD;  Location: [facility];  Service: Obstetrics;  Laterality: N/A;  RNFA   CESAREAN SECTION WITH BILATERAL TUBAL LIGATION N/A 09/14/2021   Procedure: CESAREAN SECTION WITH BILATERAL TUBAL LIGATION;  Surgeon: Conard Novak, MD;  Location: [facility];  Service: Obstetrics;  Laterality: N/A;    Family History:  Family History  Problem Relation Age of Onset   Breast cancer Maternal Aunt        not sure of age   Cancer - Colon Maternal Grandfather    Breast cancer Paternal Grandmother        38s    Social History:  Social History   Socioeconomic History   Marital status: Married    Spouse name: Rolm Gala   Number of children: Not on file   Years of education: Not on file   Highest education level: Not on file  Occupational History  Not on file  Tobacco Use   Smoking status: Never   Smokeless tobacco: Never  Vaping Use   Vaping Use: Never used  Substance and Sexual Activity   Alcohol use: No   Drug use: No   Sexual activity: Yes    Partners: Male    Birth control/protection: Surgical  Other Topics Concern   Not on file  Social History Narrative   Not on file   Social Determinants of Health   Financial Resource Strain: Not on file  Food Insecurity: Not on file  Transportation Needs: Not  on file  Physical Activity: Not on file  Stress: Not on file  Social Connections: Not on file  Intimate Partner Violence: Not on file    Allergies:  No Known Allergies  Medications: Prior to Admission medications   Medication Sig Start Date End Date Taking? Authorizing Provider  albuterol (VENTOLIN HFA) 108 (90 Base) MCG/ACT inhaler Inhale 2 puffs into the lungs 3 (three) times daily as needed for wheezing (for cough).    [provider]  calcium carbonate (TUMS - DOSED IN MG ELEMENTAL CALCIUM) 500 MG chewable tablet Chew 2 tablets (400 mg of elemental calcium total) by mouth daily as needed for indigestion or heartburn. 09/17/21   Malachy Mood, MD  Ferrous Sulfate (IRON PO) Take 1 tablet by mouth daily.    [provider]  ibuprofen (ADVIL) 600 MG tablet Take 1 tablet (600 mg total) by mouth every 6 (six) hours. 09/17/21   Malachy Mood, MD  oxyCODONE-acetaminophen (PERCOCET/ROXICET) 5-325 MG tablet Take 1 tablet by mouth every 4 (four) hours as needed for moderate pain (pain score 4-7/10). 09/17/21   Malachy Mood, MD  Prenatal Vit-Fe Fumarate-FA (PNV PRENATAL PLUS MULTIVITAMIN) 27-1 MG TABS Take 1 tablet by mouth daily.     [provider]    Physical Exam Vitals:  Vitals:   09/21/21 1148  BP: 110/70    Physical Exam Constitutional:      Appearance: Normal appearance. She is well-developed.  HENT:     Head: Normocephalic and atraumatic.  Eyes:     Extraocular Movements: Extraocular movements intact.     Pupils: Pupils are equal, round, and reactive to light.  Neck:     Thyroid: No thyromegaly.  Cardiovascular:     Rate and Rhythm: Normal rate and regular rhythm.     Heart sounds: Normal heart sounds.  Pulmonary:     Effort: Pulmonary effort is normal.     Breath sounds: Normal breath sounds.  Abdominal:     General: Bowel sounds are normal. There is no distension.     Palpations: Abdomen is soft. There is no mass.     Comments:  Incision is clean, dry, and intact, On-q pump was removed today, intact catheters.   Musculoskeletal:     Cervical back: Neck supple.  Neurological:     Mental Status: She is alert and oriented to person, place, and time.     Sensory: Sensory deficit present.     Motor: Weakness present.     Deep Tendon Reflexes:     Reflex Scores:      Patellar reflexes are 1+ on the right side and 2+ on the left side. Skin:    General: Skin is warm and dry.  Psychiatric:        Behavior: Behavior normal.        Thought Content: Thought content normal.        Judgment: Judgment normal.  Vitals reviewed.  Assessment: 25 y.o. [id number] presenting for 6 week postpartum visit  Plan: Problem List Items Addressed This Visit   None Visit Diagnoses     Nerve injury    -  Primary   Relevant Orders   Ambulatory referral to Neurology   Ambulatory referral to Physical Therapy   Difficulty walking       Relevant Orders   Ambulatory referral to Physical Therapy       1) Contraception-  Tubal ligation  2)  Pap: up to date  3) Patient underwent screening for postpartum depression with no concerns noted.  4) Right sided pain, decreased strength in right leg, would suspect a nerve injury, referral to neurology and physical therapy made.     - Follow up in 2 weeks   Adelene Idler MD, [facility], [facility] 09/21/2021 12:20 PM

## 2021-09-21 NOTE — Addendum Note (Signed)
Addended by: Clement Husbands A on: 09/21/2021 01:43 PM   Modules accepted: Orders

## 2021-09-21 NOTE — Progress Notes (Signed)
Macrobid rx

## 2021-09-25 LAB — URINE CULTURE

## 2021-09-27 NOTE — Progress Notes (Signed)
[facility]  PATIENT: Nicole Hartman DOB: 02/14/1996  REFERRING CLINICIAN: Schuman, Christanna R, * HISTORY FROM: self, husband REASON FOR VISIT: numbness   HISTORICAL  CHIEF COMPLAINT:  Chief Complaint  Patient presents with   New Patient (Initial Visit)    Room 1 w/ husband, Rolm Gala and two-week old son. Referred for evaluation of nerve injury. Concerns of numbness, tingling and balance. Using a walker to assist with ambulation. Symptoms started after her recent childbirth.     HISTORY OF PRESENT ILLNESS:  The patient presents for evaluation of pain and numbness following the birth of her child on 09/14/21. When she stood up to start walking she developed a sharp, tingling pain in her right leg. Pain is primarily in her lower back which radiates to her right hip and lateral thigh. Right leg will go numb and feel weaker as well. She is now able to walk without excruciating pain but still cannot stand for prolonged periods of time and is currently walking with a cane.  She denies fevers, saddle anesthesia, or loss of continence. She is currently taking OTCs and oxycodone as needed for pain which does help somewhat.  She did have an epidural when she gave birth. Epidural took multiple tries and she felt pain on her right side when epidural was put in. Had a scheduled C-section without complications during the birth. Baby was 7 lbs 2 oz. She is currently breastfeeding.   OTHER MEDICAL CONDITIONS: B12 deficiency   REVIEW OF SYSTEMS: Full 14 system review of systems performed and negative with exception of: leg weakness, numbness  ALLERGIES: No Known Allergies  HOME MEDICATIONS: Outpatient Medications Prior to Visit  Medication Sig Dispense Refill   albuterol (VENTOLIN HFA) 108 (90 Base) MCG/ACT inhaler Inhale 2 puffs into the lungs 3 (three) times daily as needed for wheezing (for cough).     calcium carbonate (TUMS - DOSED IN MG ELEMENTAL CALCIUM) 500 MG  chewable tablet Chew 2 tablets (400 mg of elemental calcium total) by mouth daily as needed for indigestion or heartburn.     Ferrous Sulfate (IRON PO) Take 1 tablet by mouth daily.     ibuprofen (ADVIL) 600 MG tablet Take 1 tablet (600 mg total) by mouth every 6 (six) hours. 30 tablet 0   nitrofurantoin, macrocrystal-monohydrate, (MACROBID) 100 MG capsule Take 1 capsule (100 mg total) by mouth 2 (two) times daily. 14 capsule 0   oxyCODONE-acetaminophen (PERCOCET/ROXICET) 5-325 MG tablet Take 1 tablet by mouth every 4 (four) hours as needed for moderate pain (pain score 4-7/10). 30 tablet 0   Prenatal Vit-Fe Fumarate-FA (PNV PRENATAL PLUS MULTIVITAMIN) 27-1 MG TABS Take 1 tablet by mouth daily.      No facility-administered medications prior to visit.    PAST MEDICAL HISTORY: Past Medical History:  Diagnosis Date   Anemia    Asthma    exercise induced   Cesarean delivery delivered 03/02/2016   Depression 2014   GERD (gastroesophageal reflux disease)    Headache    h/o migraines   PTSD (post-traumatic stress disorder) 2014   Thrombocytopenia (HCC)     PAST SURGICAL HISTORY: Past Surgical History:  Procedure Laterality Date   CESAREAN SECTION N/A 03/01/2016   Procedure: CESAREAN SECTION;  Surgeon: Conard Novak, MD;  Location: [facility];  Service: Obstetrics;  Laterality: N/A;   CESAREAN SECTION N/A 02/11/2020   Procedure: CESAREAN SECTION;  Surgeon: Conard Novak, MD;  Location: [facility];  Service: Obstetrics;  Laterality: N/A;  RNFA   CESAREAN SECTION WITH BILATERAL TUBAL LIGATION N/A 09/14/2021   Procedure: CESAREAN SECTION WITH BILATERAL TUBAL LIGATION;  Surgeon: Conard Novak, MD;  Location: [facility];  Service: Obstetrics;  Laterality: N/A;    FAMILY HISTORY: Family History  Problem Relation Age of Onset   Healthy Mother    Healthy Father    Cancer - Colon Maternal Grandfather    Breast cancer Paternal Grandmother        60s   Breast cancer Maternal Aunt         not sure of age    SOCIAL HISTORY: Social History   Socioeconomic History   Marital status: Married    Spouse name: Rolm Gala   Number of children: 3   Years of education: college   Highest education level: Not on file  Occupational History   Occupation: Sales executive  Tobacco Use   Smoking status: Never   Smokeless tobacco: Never  Vaping Use   Vaping Use: Never used  Substance and Sexual Activity   Alcohol use: No   Drug use: No   Sexual activity: Yes    Partners: Male    Birth control/protection: Surgical  Other Topics Concern   Not on file  Social History Narrative   Lives at home with husband and three children.   Right-handed.   One cup caffeine per day.   Social Determinants of Health   Financial Resource Strain: Not on file  Food Insecurity: Not on file  Transportation Needs: Not on file  Physical Activity: Not on file  Stress: Not on file  Social Connections: Not on file  Intimate Partner Violence: Not on file     PHYSICAL EXAM  GENERAL EXAM/CONSTITUTIONAL: Vitals:  Vitals:   09/28/21 0843  BP: 98/63  Pulse: 85  Weight: 143 lb 6.4 oz (65 kg)  Height: 5' (1.524 m)   Body mass index is 28.01 kg/m. Wt Readings from Last 3 Encounters:  09/28/21 143 lb 6.4 oz (65 kg)  09/21/21 153 lb (69.4 kg)  09/14/21 158 lb 11.7 oz (72 kg)   Patient is in no distress; well developed, nourished and groomed; neck is supple  CARDIOVASCULAR: Examination of peripheral vascular system by observation and palpation is normal  EYES: Pupils round and reactive to light, Visual fields full to confrontation, Extraocular movements intact  MUSCULOSKELETAL: Gait, strength, tone, movements noted in Neurologic exam below  NEUROLOGIC: MENTAL STATUS:  awake, alert, oriented to person, place and time recent and remote memory intact normal attention and concentration language fluent, comprehension intact, naming intact fund of knowledge appropriate  CRANIAL NERVE:   2nd, 3rd, 4th, 6th - pupils equal and reactive to light, visual fields full to confrontation, extraocular muscles intact, no nystagmus 5th - facial sensation symmetric 7th - facial strength symmetric 8th - hearing intact 9th - palate elevates symmetrically, uvula midline 11th - shoulder shrug symmetric 12th - tongue protrusion midline  MOTOR:  4/5 strength in RLE hip flexion, adduction, knee extension, knee flexion, and 4+/5 plantar flexion. Otherwise 5/5 strength throughout  SENSORY:  Decreased sensation to light touch and pinprick over frontal and lateral thigh, medial shin, and medial dorsum of foot on RLE. Sensation intact to light touch and pinprick in RUE, LUE, and LLE.  COORDINATION:  finger-nose-finger intact bilaterally  REFLEXES:  deep tendon reflexes present and symmetric  GAIT/STATION:  Slow gait, right knee buckles with decreased lift of right foot     DIAGNOSTIC DATA (LABS, IMAGING, TESTING) - I reviewed patient  records, labs, notes, testing and imaging myself where available.  Lab Results  Component Value Date   WBC 5.5 09/15/2021   HGB 9.4 (L) 09/15/2021   HCT 29.3 (L) 09/15/2021   MCV 90.4 09/15/2021   PLT 157 09/15/2021      Component Value Date/Time   NA 139 02/09/2013 0954   K 3.7 02/09/2013 0954   CL 108 (H) 02/09/2013 0954   CO2 24 02/09/2013 0954   GLUCOSE 74 02/09/2013 0954   BUN 12 02/09/2013 0954   CREATININE 0.85 02/09/2013 0954   CALCIUM 8.9 (L) 02/09/2013 0954   PROT 8.2 02/09/2013 0954   ALBUMIN 4.2 02/09/2013 0954   AST 25 02/09/2013 0954   ALT 19 02/09/2013 0954   ALKPHOS 62 (L) 02/09/2013 0954   BILITOT 0.2 02/09/2013 0954   Lab Results  Component Value Date   CHOL 202 (H) 01/08/2013   HDL 63 01/08/2013   LDLCALC 126 (H) 01/08/2013   TRIG 66 01/08/2013   CHOLHDL 3.2 01/08/2013   Lab Results  Component Value Date   HGBA1C 5.9 (H) 01/08/2013   Lab Results  Component Value Date   VITAMINB12 298 09/11/2021   Lab  Results  Component Value Date   TSH 1.54 02/09/2013    ASSESSMENT AND PLAN  25 y.o. year old female who presents for evaluation of 2 weeks of right leg numbness and weakness following childbirth. Her exam is significant for decreased strength and sensation of the RLE which is most prominent in L2-L4 distribution. Will order MRI lumbar spine to assess for structural causes of new radiculopathy including epidural hematoma. May consider EMG in the future if MRI is unrevealing and symptoms do not begin to resolve on their own.   1. Weakness of right lower extremity       PLAN: -MRI L-spine -next steps: consider EMG if MRI unrevealing and symptoms persist  Orders Placed This Encounter  Procedures   MR LUMBAR SPINE WO CONTRAST    Return in about 3 months (around 12/27/2021).    Ocie Doyne, MD 09/28/21 10:16 AM  I spent an average of 45 minutes chart reviewing and counseling the patient, with at least 50% of the time face to face with the patient.   [facility] [address], [address] [place], [place] [address] [phone]

## 2021-09-28 ENCOUNTER — Encounter: Payer: Self-pay | Admitting: Psychiatry

## 2021-09-28 ENCOUNTER — Other Ambulatory Visit: Payer: Self-pay

## 2021-09-28 ENCOUNTER — Ambulatory Visit: Payer: Medicaid Other | Admitting: Psychiatry

## 2021-09-28 VITALS — BP 98/63 | HR 85 | Ht 60.0 in | Wt 143.4 lb

## 2021-09-28 DIAGNOSIS — R29898 Other symptoms and signs involving the musculoskeletal system: Secondary | ICD-10-CM | POA: Diagnosis not present

## 2021-09-28 NOTE — Patient Instructions (Signed)
MRI lumbar spine (lower back)

## 2021-10-04 ENCOUNTER — Ambulatory Visit
Admission: RE | Admit: 2021-10-04 | Discharge: 2021-10-04 | Disposition: A | Discharge status: Home / Self Care | Payer: Medicaid Other | Source: Ambulatory Visit | Attending: Psychiatry | Admitting: Psychiatry

## 2021-10-04 ENCOUNTER — Encounter: Payer: Self-pay | Admitting: Physical Therapy

## 2021-10-04 ENCOUNTER — Other Ambulatory Visit: Payer: Self-pay

## 2021-10-04 ENCOUNTER — Ambulatory Visit (INDEPENDENT_AMBULATORY_CARE_PROVIDER_SITE_OTHER): Payer: Medicaid Other | Admitting: Obstetrics and Gynecology

## 2021-10-04 ENCOUNTER — Ambulatory Visit: Payer: Medicaid Other | Attending: Obstetrics and Gynecology | Admitting: Physical Therapy

## 2021-10-04 DIAGNOSIS — T148XXA Other injury of unspecified body region, initial encounter: Secondary | ICD-10-CM | POA: Insufficient documentation

## 2021-10-04 DIAGNOSIS — M6281 Muscle weakness (generalized): Secondary | ICD-10-CM | POA: Insufficient documentation

## 2021-10-04 DIAGNOSIS — R262 Difficulty in walking, not elsewhere classified: Secondary | ICD-10-CM | POA: Diagnosis not present

## 2021-10-04 DIAGNOSIS — R29898 Other symptoms and signs involving the musculoskeletal system: Secondary | ICD-10-CM

## 2021-10-04 NOTE — Therapy (Signed)
[facility] [address]. [place], [place], [address] Phone: [phone]   Fax:  [phone]  Physical Therapy Evaluation  Patient Details  Name: Nicole Hartman MRN: [id number] Date of Birth: 1995-10-15 Referring Provider (PT): [place], [place]   Encounter Date: 10/04/2021   PT End of Session - 10/04/21 1334     Visit Number 1    Number of Visits 12    Date for PT Re-Evaluation 11/15/21    PT Start Time 1335    PT Stop Time 1415    PT Time Calculation (min) 40 min    Activity Tolerance Patient tolerated treatment well    Behavior During Therapy [facility] for tasks assessed/performed             Past Medical History:  Diagnosis Date   Anemia    Asthma    exercise induced   Cesarean delivery delivered 03/02/2016   Depression 2014   GERD (gastroesophageal reflux disease)    Headache    h/o migraines   PTSD (post-traumatic stress disorder) 2014   Thrombocytopenia (HCC)     Past Surgical History:  Procedure Laterality Date   CESAREAN SECTION N/A 03/01/2016   Procedure: CESAREAN SECTION;  Surgeon: Conard Novak, MD;  Location: [facility] ORS;  Service: Obstetrics;  Laterality: N/A;   CESAREAN SECTION N/A 02/11/2020   Procedure: CESAREAN SECTION;  Surgeon: Conard Novak, MD;  Location: [facility] ORS;  Service: Obstetrics;  Laterality: N/A;  RNFA   CESAREAN SECTION WITH BILATERAL TUBAL LIGATION N/A 09/14/2021   Procedure: CESAREAN SECTION WITH BILATERAL TUBAL LIGATION;  Surgeon: Conard Novak, MD;  Location: [facility] ORS;  Service: Obstetrics;  Laterality: N/A;    There were no vitals filed for this visit.        [facility] PT Assessment - 10/04/21 1332       Assessment   Medical Diagnosis Nerve injury; difficulty walking    Referring Provider (PT) Jerene Pitch, [place]    Onset Date/Surgical Date 09/14/21    Hand Dominance Right    Prior Therapy None for this dx      Balance Screen   Has the patient fallen in the  past 6 months No            SUBJECTIVE Denies red flag symptoms.  Chief complaint:  Patient notes that at this time she is able to walk without excruciating pain. Earlier this week she was able to walk without RW around the house. She tried to remove some glue over the incision but had increased numbness in the RLE. Patient endorses buckling of RLE, but is able to support self with RW or walls, etc. Patient has limitations with hygiene, dressing, cooking, washing dishes.   Pain location:  c-section incision with radiation to RLE Pain: Present 4/10, Best 4/10, Worst 8/10: hip flexion, increased activity, squatting Pain quality: pain quality: sharp and tingling; sudden. Lingers for a few minutes. Radiating pain: Yes  Numbness/Tingling: Yes  24 hour pain behavior: depends on movement; has noticed it more in the evening this week Aggravating factors: pivoting, rotating, hip flexion Easing factors: gentle lateral hip manual pressure How long can you sit: if reclined, tolerable for long duration How long can you stand: 5 minutes How long can you walk: RW, limited to household distances  Imaging: Yes ; MRI scheduled this afternoon (10/04/21)  Occupational demands: dental assistant    OBJECTIVE MUSCULOSKELETAL: Tremor: Absent Bulk: Normal Tone: Normal, no clonus  Posture No gross abnormalities  noted in standing or seated posture. Antalgic standing posture with limited WB on RLE (<25%).  Gait Patient ambulates with RW, limited foot clearance and stance time on RLE. Increased use of BUE for WB.   Strength R/L 3-*/5 Hip flexion 3*/5 Hip external rotation 3*/5 Hip internal rotation 3/5 Knee extension 3/5 Knee flexion  NEUROLOGICAL: Mental Status Patient is oriented to person, place and time.  Recent memory is intact.  Remote memory is intact.  Attention span and concentration are intact.  Expressive speech is intact.  Patient's fund of knowledge is within normal limits  for educational level.  Sensation Grossly intact to light touch bilateral UEs/LEs as determined by testing dermatomes L2-S2 respectively. Patient noting decreased sensation comparatively to L at RLQ, and L2-L4 dermatomal patterns. Proprioception and hot/cold testing deferred on this date  FUNCTIONAL OUTCOME MEASURES   Results Comments  TUG seconds Deferred 2/2 to time and pain  5TSTS 63.04 seconds Limited WB on RLE, no use of BUE; 5/10 pain  2 or 6 Minute Walk Test  Deferred 2/2 to time and pain    ASSESSMENT Patient is a 25 year old presenting to clinic with chief complaints of RLQ pain and RLE weakness with numbness and pain. Upon examination, patient demonstrates deficits in RLE strength, gait, pain, and function as evidenced by 5TSTS of 63.4 sec with limited WB on RLE, gait with RW and limited RLE foot clearance and stance time, pain with trunk rotation, trunk flexion, hip flexion, hip ER and IR, and grossly 3/5 RLE strength. Patient's responses on FOTO outcome measures (50) indicate severe functional limitations/disability/distress. Patient's progress may be limited dependent on etiology and nerve injury; however, patient's motivation is advantageous. Today's evaluation was limited by pain and in consideration of pending MRI as well as potential for aggrvating positioning for the imaging. Further assessment and functional testing to be completed on return to clinic. Patient will benefit from continued skilled therapeutic intervention to address deficits in RLE strength, gait, pain, and function in order to increase function and improve overall QOL.         Objective measurements completed on examination: See above findings.                     PT Long Term Goals - 10/04/21 1509       PT LONG TERM GOAL #1   Title Patient will be independent with HEP in order to improve strength and balance in order to decrease fall risk and improve function at home and in the  community.    Baseline IE: not initiated    Time 6    Period Weeks    Status New    Target Date 11/15/21      PT LONG TERM GOAL #2   Title Patient will decrease 5TSTS to < 12 seconds without UE support nor compensatory patterns in order to demonstrate clinically significant improvement in LE strength and to approach peer normative values.    Baseline IE: 63.4 sec (LLE bias)    Time 6    Period Weeks    Status New    Target Date 11/15/21      PT LONG TERM GOAL #3   Title Patient will decrease TUG to below 14 seconds/decrease in order to demonstrate decreased fall risk.    Baseline IE: assess at next visit    Time 6    Period Weeks    Status New    Target Date 11/15/21  PT LONG TERM GOAL #4   Title Patient will increase by at least 44m (123ft) in order to demonstrate clinically significant improvement in cardiopulmonary endurance and community ambulation    Baseline IE: assess at next visit    Time 6    Period Weeks    Status New    Target Date 11/15/21      PT LONG TERM GOAL #5   Title Patient will demonstrate improved function as evidenced by a score of 68 on FOTO measure for full participation in activities at home and in the community.    Baseline IE: 50    Time 6    Period Weeks    Status New    Target Date 11/15/21                    Plan - 10/04/21 1336     Clinical Impression Statement Patient is a 25 year old presenting to clinic with chief complaints of RLQ pain and RLE weakness with numbness and pain. Upon examination, patient demonstrates deficits in RLE strength, gait, pain, and function as evidenced by 5TSTS of 63.4 sec with limited WB on RLE, gait with RW and limited RLE foot clearance and stance time, pain with trunk rotation, trunk flexion, hip flexion, hip ER and IR, and grossly 3/5 RLE strength. Patient's responses on FOTO outcome measures (50) indicate severe functional limitations/disability/distress. Patient's progress may be limited  dependent on etiology and nerve injury; however, patient's motivation is advantageous. Today's evaluation was limited by pain and in consideration of pending MRI as well as potential for aggrvating positioning for the imaging. Further assessment and functional testing to be completed on return to clinic. Patient will benefit from continued skilled therapeutic intervention to address deficits in RLE strength, gait, pain, and function in order to increase function and improve overall QOL.    Personal Factors and Comorbidities Comorbidity 3+;Time since onset of injury/illness/exacerbation    Comorbidities anemia, asthma, depression, GERD, migraines, PTSD    Examination-Activity Limitations Bathing;Hygiene/Grooming;Toileting;Squat;Sleep;Transfers;Dressing;Stairs;Bend;Lift    Examination-Participation Restrictions Community Activity;Occupation;Driving;Meal Prep;Cleaning;Shop;Laundry    Stability/Clinical Decision Making Evolving/Moderate complexity    Clinical Decision Making Moderate    Rehab Potential Good    PT Frequency 2x / week    PT Duration 6 weeks    PT Treatment/Interventions ADLs/Self Care Home Management;Cryotherapy;Electrical Stimulation;Moist Heat;Neuromuscular re-education;Therapeutic exercise;Stair training;Gait training;Patient/family education;Orthotic Fit/Training;Manual techniques;Scar mobilization;Taping;Joint Manipulations;Spinal Manipulations    PT Next Visit Plan complete physical assessment    PT Home Exercise Plan not initiated    Consulted and Agree with Plan of Care Patient             Patient will benefit from skilled therapeutic intervention in order to improve the following deficits and impairments:  Abnormal gait, Decreased balance, Decreased endurance, Decreased mobility, Difficulty walking, Pain, Decreased strength, Decreased coordination, Decreased activity tolerance, Decreased scar mobility  Visit Diagnosis: Difficulty in walking, not elsewhere  classified  Muscle weakness (generalized)     Problem List Patient Active Problem List   Diagnosis Date Noted   History of cesarean delivery 09/14/2021   Encounter for sterilization 09/14/2021   [redacted] weeks gestation of pregnancy 09/14/2021   Back pain affecting pregnancy in third trimester 08/27/2021   Anemia    B12 deficiency 07/13/2021   Absolute anemia 07/13/2021   Thrombocytopenia affecting pregnancy, antepartum (HCC) 07/10/2021   Depressed mood 06/14/2021   Pelvic pain in pregnancy 06/14/2021   History of cesarean delivery affecting pregnancy 06/14/2021   Prenatal care,  subsequent pregnancy, second trimester 02/23/2021   Nausea/vomiting in pregnancy 02/23/2021   Supervision of high risk pregnancy, antepartum 07/02/2019   PTSD (post-traumatic stress disorder) 01/08/2013   MDD (major depressive disorder), recurrent episode, severe (HCC) 01/08/2013    Sheria Lang PT, DPT [phone]  10/04/2021, 3:13 PM  [facility] [address] [place], [place], [address] Phone: [phone]   Fax:  [phone]  Name: CHRISTNE PLATTS MRN: [id number] Date of Birth: 02-08-1996

## 2021-10-04 NOTE — Progress Notes (Signed)
Postpartum Visit  Chief Complaint:  Chief Complaint  Patient presents with   Postpartum Care    History of Present Illness: Patient is a [name] presents for postpartum visit.  Date of delivery: 09/14/2021 Type of delivery: cesarean section   Post partum depression/anxiety noted:  no Edinburgh Post-Partum Depression Score: 0  Date of last PAP: 2921 NIL   She reports that she is able to follow-up with neurology.  They ordered her spinal MRI.  She was feeling slightly better but then was picking the glue off of the right side of her incision and had sharp shooting pain and weakness again in her right leg.  Newborn Details:  SINGLETON :  1. Infant Status: Infant doing well at home with mother.   Review of Systems: Review of Systems  Constitutional:  Positive for malaise/fatigue. Negative for chills, fever and weight loss.  HENT:  Negative for congestion, hearing loss and sinus pain.   Eyes:  Negative for blurred vision and double vision.  Respiratory:  Negative for cough, sputum production, shortness of breath and wheezing.   Cardiovascular:  Negative for chest pain, palpitations, orthopnea and leg swelling.  Gastrointestinal:  Negative for abdominal pain, constipation, diarrhea, nausea and vomiting.  Genitourinary:  Negative for dysuria, flank pain, frequency, hematuria and urgency.  Musculoskeletal:  Negative for back pain, falls and joint pain.  Skin:  Negative for itching and rash.  Neurological:  Negative for dizziness and headaches.  Psychiatric/Behavioral:  Negative for depression, substance abuse and suicidal ideas. The patient is not nervous/anxious.    Past Medical History:  Past Medical History:  Diagnosis Date   Anemia    Asthma    exercise induced   Cesarean delivery delivered 03/02/2016   Depression 2014   GERD (gastroesophageal reflux disease)    Headache    h/o migraines   PTSD (post-traumatic stress disorder) 2014   Thrombocytopenia (HCC)      Past Surgical History:  Past Surgical History:  Procedure Laterality Date   CESAREAN SECTION N/A 03/01/2016   Procedure: CESAREAN SECTION;  Surgeon: Conard Novak, MD;  Location: [facility] ORS;  Service: Obstetrics;  Laterality: N/A;   CESAREAN SECTION N/A 02/11/2020   Procedure: CESAREAN SECTION;  Surgeon: Conard Novak, MD;  Location: [facility] ORS;  Service: Obstetrics;  Laterality: N/A;  RNFA   CESAREAN SECTION WITH BILATERAL TUBAL LIGATION N/A 09/14/2021   Procedure: CESAREAN SECTION WITH BILATERAL TUBAL LIGATION;  Surgeon: Conard Novak, MD;  Location: [facility] OR[facility];  Service: Obstetrics;  Laterality: N/A;    Family History:  Family History  Problem Relation Age of Onset   Healthy Mother    Healthy Father    Cancer - Colon Maternal Grandfather    Breast cancer Paternal Grandmother        40s   Breast cancer Maternal Aunt        not sure of age    Social History:  Social History   Socioeconomic History   Marital status: Married    Spouse name: Rolm Gala   Number of children: 3   Years of education: college   Highest education level: Not on file  Occupational History   Occupation: Sales executive  Tobacco Use   Smoking status: Never   Smokeless tobacco: Never  Vaping Use   Vaping Use: Never used  Substance and Sexual Activity   Alcohol use: No   Drug use: No   Sexual activity: Yes    Partners: Male    Birth  control/protection: Surgical  Other Topics Concern   Not on file  Social History Narrative   Lives at home with husband and three children.   Right-handed.   One cup caffeine per day.   Social Determinants of Health   Financial Resource Strain: Not on file  Food Insecurity: Not on file  Transportation Needs: Not on file  Physical Activity: Not on file  Stress: Not on file  Social Connections: Not on file  Intimate Partner Violence: Not on file    Allergies:  No Known Allergies  Medications: Prior to Admission medications   Medication Sig Start  Date End Date Taking? Authorizing Provider  albuterol (VENTOLIN HFA) 108 (90 Base) MCG/ACT inhaler Inhale 2 puffs into the lungs 3 (three) times daily as needed for wheezing (for cough).   Yes [provider]  calcium carbonate (TUMS - DOSED IN MG ELEMENTAL CALCIUM) 500 MG chewable tablet Chew 2 tablets (400 mg of elemental calcium total) by mouth daily as needed for indigestion or heartburn. 09/17/21  Yes Malachy Mood, MD  Ferrous Sulfate (IRON PO) Take 1 tablet by mouth daily.   Yes [provider]  ibuprofen (ADVIL) 600 MG tablet Take 1 tablet (600 mg total) by mouth every 6 (six) hours. 09/17/21  Yes Malachy Mood, MD  oxyCODONE-acetaminophen (PERCOCET/ROXICET) 5-325 MG tablet Take 1 tablet by mouth every 4 (four) hours as needed for moderate pain (pain score 4-7/10). Patient not taking: Reported on 10/04/2021 09/17/21  Yes Malachy Mood, MD  Prenatal Vit-Fe Fumarate-FA (PNV PRENATAL PLUS MULTIVITAMIN) 27-1 MG TABS Take 1 tablet by mouth daily.    Yes [provider]  nitrofurantoin, macrocrystal-monohydrate, (MACROBID) 100 MG capsule Take 1 capsule (100 mg total) by mouth 2 (two) times daily. 09/21/21   Homero Fellers, MD    Physical Exam Vitals:  Vitals:   10/04/21 1145  BP: 116/70    Physical Exam Constitutional:      Appearance: Normal appearance. She is well-developed.  HENT:     Head: Normocephalic and atraumatic.  Eyes:     Extraocular Movements: Extraocular movements intact.     Pupils: Pupils are equal, round, and reactive to light.  Neck:     Thyroid: No thyromegaly.  Cardiovascular:     Rate and Rhythm: Normal rate and regular rhythm.     Heart sounds: Normal heart sounds.  Pulmonary:     Effort: Pulmonary effort is normal.     Breath sounds: Normal breath sounds.  Abdominal:     General: Bowel sounds are normal. There is no distension.     Palpations: Abdomen is soft. There is no mass.  Musculoskeletal:     Cervical  back: Neck supple.  Neurological:     Mental Status: She is alert and oriented to person, place, and time.  Skin:    General: Skin is warm and dry.  Psychiatric:        Behavior: Behavior normal.        Thought Content: Thought content normal.        Judgment: Judgment normal.  Vitals reviewed.    Assessment: 25 y.o. [id number] presenting for 6 week postpartum visit  Plan: Problem List Items Addressed This Visit   None Visit Diagnoses     Postpartum care and examination    -  Primary       1) Contraception-  tubal ligation  2)  Pap smear is up to date: 2021 NIL   3) Patient underwent screening for postpartum depression with  no concerns noted.  4) Patient has follow-up with PT and neurology planned for further evaluation of her right sided weakness.  - Follow up in 2 weeks   Adrian Prows MD, [place], [facility] 10/05/2021 2:18 PM

## 2021-10-09 ENCOUNTER — Other Ambulatory Visit: Payer: Self-pay

## 2021-10-09 ENCOUNTER — Ambulatory Visit: Payer: Medicaid Other | Admitting: Physical Therapy

## 2021-10-09 ENCOUNTER — Encounter: Payer: Self-pay | Admitting: Physical Therapy

## 2021-10-09 DIAGNOSIS — R262 Difficulty in walking, not elsewhere classified: Secondary | ICD-10-CM | POA: Diagnosis not present

## 2021-10-09 DIAGNOSIS — M6281 Muscle weakness (generalized): Secondary | ICD-10-CM

## 2021-10-09 NOTE — Therapy (Signed)
[facility] [address]. [place], [place], [address] Phone: [phone]   Fax:  [phone]  Physical Therapy Treatment  Patient Details  Name: Nicole Hartman MRN: [id number] Date of Birth: [birth year] Referring Provider (PT): [place], [place]   Encounter Date: 10/09/2021    Past Medical History:  Diagnosis Date   Anemia    Asthma    exercise induced   Cesarean delivery delivered 03/02/2016   Depression 2014   GERD (gastroesophageal reflux disease)    Headache    h/o migraines   PTSD (post-traumatic stress disorder) 2014   Thrombocytopenia (HCC)     Past Surgical History:  Procedure Laterality Date   CESAREAN SECTION N/A 03/01/2016   Procedure: CESAREAN SECTION;  Surgeon: Conard Novak, MD;  Location: [facility];  Service: Obstetrics;  Laterality: N/A;   CESAREAN SECTION N/A 02/11/2020   Procedure: CESAREAN SECTION;  Surgeon: Conard Novak, MD;  Location: [facility];  Service: Obstetrics;  Laterality: N/A;  RNFA   CESAREAN SECTION WITH BILATERAL TUBAL LIGATION N/A 09/14/2021   Procedure: CESAREAN SECTION WITH BILATERAL TUBAL LIGATION;  Surgeon: Conard Novak, MD;  Location: [facility];  Service: Obstetrics;  Laterality: N/A;    There were no vitals filed for this visit.   Subjective Assessment - 10/09/21 1338     Subjective Patient notes that she had her MRI and is awaiting results. Patient notes her leg pain has been improved but has more numbness.    Currently in Pain? Yes    Pain Score 5     Pain Location Leg    Pain Orientation Right    Pain Descriptors / Indicators Numbness                                             PT Long Term Goals - 10/04/21 1509       PT LONG TERM GOAL #1   Title Patient will be independent with HEP in order to improve strength and balance in order to decrease fall risk and improve function at home and in the community.    Baseline IE:  not initiated    Time 6    Period Weeks    Status New    Target Date 11/15/21      PT LONG TERM GOAL #2   Title Patient will decrease 5TSTS to < 12 seconds without UE support nor compensatory patterns in order to demonstrate clinically significant improvement in LE strength and to approach peer normative values.    Baseline IE: 63.4 sec (LLE bias)    Time 6    Period Weeks    Status New    Target Date 11/15/21      PT LONG TERM GOAL #3   Title Patient will decrease TUG to below 14 seconds/decrease in order to demonstrate decreased fall risk.    Baseline IE: assess at next visit    Time 6    Period Weeks    Status New    Target Date 11/15/21      PT LONG TERM GOAL #4   Title Patient will increase by at least 13m (130ft) in order to demonstrate clinically significant improvement in cardiopulmonary endurance and community ambulation    Baseline IE: assess at next visit    Time 6    Period Weeks    Status  New    Target Date 11/15/21      PT LONG TERM GOAL #5   Title Patient will demonstrate improved function as evidenced by a score of 68 on FOTO measure for full participation in activities at home and in the community.    Baseline IE: 50    Time 6    Period Weeks    Status New    Target Date 11/15/21                   Plan - 10/09/21 1528     Personal Factors and Comorbidities Comorbidity 3+;Time since onset of injury/illness/exacerbation    Comorbidities anemia, asthma, depression, GERD, migraines, PTSD    Examination-Activity Limitations Bathing;Hygiene/Grooming;Toileting;Squat;Sleep;Transfers;Dressing;Stairs;Bend;Lift    Examination-Participation Restrictions Community Activity;Occupation;Driving;Meal Prep;Cleaning;Shop;Laundry    Stability/Clinical Decision Making Evolving/Moderate complexity    Rehab Potential Good    PT Frequency 2x / week    PT Duration 6 weeks    PT Treatment/Interventions ADLs/Self Care Home Management;Cryotherapy;Electrical  Stimulation;Moist Heat;Neuromuscular re-education;Therapeutic exercise;Stair training;Gait training;Patient/family education;Orthotic Fit/Training;Manual techniques;Scar mobilization;Taping;Joint Manipulations;Spinal Manipulations    PT Next Visit Plan complete physical assessment    PT Home Exercise Plan not initiated    Consulted and Agree with Plan of Care Patient             Patient will benefit from skilled therapeutic intervention in order to improve the following deficits and impairments:  Abnormal gait, Decreased balance, Decreased endurance, Decreased mobility, Difficulty walking, Pain, Decreased strength, Decreased coordination, Decreased activity tolerance, Decreased scar mobility  Visit Diagnosis: Difficulty in walking, not elsewhere classified  Muscle weakness (generalized)     Problem List Patient Active Problem List   Diagnosis Date Noted   History of cesarean delivery 09/14/2021   Encounter for sterilization 09/14/2021   [redacted] weeks gestation of pregnancy 09/14/2021   Back pain affecting pregnancy in third trimester 08/27/2021   Anemia    B12 deficiency 07/13/2021   Absolute anemia 07/13/2021   Thrombocytopenia affecting pregnancy, antepartum (HCC) 07/10/2021   Depressed mood 06/14/2021   Pelvic pain in pregnancy 06/14/2021   History of cesarean delivery affecting pregnancy 06/14/2021   Prenatal care, subsequent pregnancy, second trimester 02/23/2021   Nausea/vomiting in pregnancy 02/23/2021   Supervision of high risk pregnancy, antepartum 07/02/2019   PTSD (post-traumatic stress disorder) 01/08/2013   MDD (major depressive disorder), recurrent episode, severe (HCC) 01/08/2013    Kathryne Eriksson, PT 10/09/2021, 3:28 PM  [facility] [address] [place], [place], [address] Phone: [phone]   Fax:  [phone]  Name: Nicole Hartman MRN: [id number] Date of Birth: [birth year]

## 2021-10-09 NOTE — Therapy (Signed)
[facility] [address] [place], [place], [address] Phone: [phone]   Fax:  [phone]  Physical Therapy Treatment  Patient Details  Name: Nicole Hartman MRN: [id number] Date of Birth: 1996-08-25 Referring Provider (PT): [place], [place]   Encounter Date: 10/09/2021   PT End of Session - 10/09/21 1530     Visit Number 2    Number of Visits 12    Date for PT Re-Evaluation 11/15/21    PT Start Time 1335    PT Stop Time 1415    PT Time Calculation (min) 40 min    Activity Tolerance Patient tolerated treatment well    Behavior During Therapy [facility] for tasks assessed/performed             Past Medical History:  Diagnosis Date   Anemia    Asthma    exercise induced   Cesarean delivery delivered 03/02/2016   Depression 2014   GERD (gastroesophageal reflux disease)    Headache    h/o migraines   PTSD (post-traumatic stress disorder) 2014   Thrombocytopenia (HCC)     Past Surgical History:  Procedure Laterality Date   CESAREAN SECTION N/A 03/01/2016   Procedure: CESAREAN SECTION;  Surgeon: Conard Novak, MD;  Location: [facility] ORS;  Service: Obstetrics;  Laterality: N/A;   CESAREAN SECTION N/A 02/11/2020   Procedure: CESAREAN SECTION;  Surgeon: Conard Novak, MD;  Location: [facility] ORS;  Service: Obstetrics;  Laterality: N/A;  RNFA   CESAREAN SECTION WITH BILATERAL TUBAL LIGATION N/A 09/14/2021   Procedure: CESAREAN SECTION WITH BILATERAL TUBAL LIGATION;  Surgeon: Conard Novak, MD;  Location: [facility] ORS;  Service: Obstetrics;  Laterality: N/A;    There were no vitals filed for this visit.   Subjective Assessment - 10/09/21 1338     Subjective Patient notes that she had her MRI and is awaiting results. Patient notes her leg pain has been improved but has more numbness.    Currently in Pain? Yes    Pain Score 5     Pain Location Leg    Pain Orientation Right    Pain Descriptors / Indicators Numbness              TREATMENT Neuromuscular Re-education: 2 min walk test: 56 ft, 5/10 pain (burning), RW, SBA Sidelying femoral nerve glide for improved pain and mobility Prone press up for improved posture and neural mobility Prone femoral nerve glide for improved pain and neural mobility  Patient educated throughout session on appropriate technique and form using multi-modal cueing, HEP, and activity modification. Patient articulated understanding and returned demonstration.  Patient Response to interventions: 4/10 burning sensation  ASSESSMENT Patient presents to clinic with excellent motivation to participate in therapy. Patient demonstrates deficits in RLE strength, gait, pain, and function. Patient able to achieve prone position for femoral nerve glides during today's session and responded positively to active interventions. Patient will benefit from continued skilled therapeutic intervention to address remaining deficits in RLE strength, gait, pain, and function in order to increase function and improve overall QOL.      PT Long Term Goals - 10/04/21 1509       PT LONG TERM GOAL #1   Title Patient will be independent with HEP in order to improve strength and balance in order to decrease fall risk and improve function at home and in the community.    Baseline IE: not initiated    Time 6    Period Weeks  Status New    Target Date 11/15/21      PT LONG TERM GOAL #2   Title Patient will decrease 5TSTS to < 12 seconds without UE support nor compensatory patterns in order to demonstrate clinically significant improvement in LE strength and to approach peer normative values.    Baseline IE: 63.4 sec (LLE bias)    Time 6    Period Weeks    Status New    Target Date 11/15/21      PT LONG TERM GOAL #3   Title Patient will decrease TUG to below 14 seconds/decrease in order to demonstrate decreased fall risk.    Baseline IE: assess at next visit    Time 6    Period Weeks     Status New    Target Date 11/15/21      PT LONG TERM GOAL #4   Title Patient will increase by at least 87m (124ft) in order to demonstrate clinically significant improvement in cardiopulmonary endurance and community ambulation    Baseline IE: assess at next visit    Time 6    Period Weeks    Status New    Target Date 11/15/21      PT LONG TERM GOAL #5   Title Patient will demonstrate improved function as evidenced by a score of 68 on FOTO measure for full participation in activities at home and in the community.    Baseline IE: 50    Time 6    Period Weeks    Status New    Target Date 11/15/21                   Plan - 10/09/21 1528     Clinical Impression Statement Patient presents to clinic with excellent motivation to participate in therapy. Patient demonstrates deficits in RLE strength, gait, pain, and function. Patient able to achieve prone position for femoral nerve glides during today's session and responded positively to active interventions. Patient will benefit from continued skilled therapeutic intervention to address remaining deficits in RLE strength, gait, pain, and function in order to increase function and improve overall QOL.    Personal Factors and Comorbidities Comorbidity 3+;Time since onset of injury/illness/exacerbation    Comorbidities anemia, asthma, depression, GERD, migraines, PTSD    Examination-Activity Limitations Bathing;Hygiene/Grooming;Toileting;Squat;Sleep;Transfers;Dressing;Stairs;Bend;Lift    Examination-Participation Restrictions Community Activity;Occupation;Driving;Meal Prep;Cleaning;Shop;Laundry    Stability/Clinical Decision Making Evolving/Moderate complexity    Rehab Potential Good    PT Frequency 2x / week    PT Duration 6 weeks    PT Treatment/Interventions ADLs/Self Care Home Management;Cryotherapy;Electrical Stimulation;Moist Heat;Neuromuscular re-education;Therapeutic exercise;Stair training;Gait training;Patient/family  education;Orthotic Fit/Training;Manual techniques;Scar mobilization;Taping;Joint Manipulations;Spinal Manipulations    PT Next Visit Plan complete physical assessment    PT Home Exercise Plan not initiated    Consulted and Agree with Plan of Care Patient             Patient will benefit from skilled therapeutic intervention in order to improve the following deficits and impairments:  Abnormal gait, Decreased balance, Decreased endurance, Decreased mobility, Difficulty walking, Pain, Decreased strength, Decreased coordination, Decreased activity tolerance, Decreased scar mobility  Visit Diagnosis: Difficulty in walking, not elsewhere classified  Muscle weakness (generalized)     Problem List Patient Active Problem List   Diagnosis Date Noted   History of cesarean delivery 09/14/2021   Encounter for sterilization 09/14/2021   [redacted] weeks gestation of pregnancy 09/14/2021   Back pain affecting pregnancy in third trimester 08/27/2021   Anemia  B12 deficiency 07/13/2021   Absolute anemia 07/13/2021   Thrombocytopenia affecting pregnancy, antepartum (HCC) 07/10/2021   Depressed mood 06/14/2021   Pelvic pain in pregnancy 06/14/2021   History of cesarean delivery affecting pregnancy 06/14/2021   Prenatal care, subsequent pregnancy, second trimester 02/23/2021   Nausea/vomiting in pregnancy 02/23/2021   Supervision of high risk pregnancy, antepartum 07/02/2019   PTSD (post-traumatic stress disorder) 01/08/2013   MDD (major depressive disorder), recurrent episode, severe (HCC) 01/08/2013    Sheria Lang PT, DPT [phone]  10/09/2021, 3:34 PM  [facility] [address] [place], [place], [address] Phone: [phone]   Fax:  [phone]  Name: ANOLA MCGOUGH MRN: [id number] Date of Birth: 1995/10/31

## 2021-10-11 ENCOUNTER — Encounter: Payer: Self-pay | Admitting: Physical Therapy

## 2021-10-11 ENCOUNTER — Other Ambulatory Visit: Payer: Self-pay

## 2021-10-11 ENCOUNTER — Ambulatory Visit: Payer: Medicaid Other | Admitting: Physical Therapy

## 2021-10-11 DIAGNOSIS — M6281 Muscle weakness (generalized): Secondary | ICD-10-CM

## 2021-10-11 DIAGNOSIS — R262 Difficulty in walking, not elsewhere classified: Secondary | ICD-10-CM | POA: Diagnosis not present

## 2021-10-11 NOTE — Therapy (Signed)
[facility] [address] [place], [place], [address] Phone: [phone]   Fax:  [phone]  Physical Therapy Treatment  Patient Details  Name: Nicole Hartman MRN: [id number] Date of Birth: 11-May-1996 Referring Provider (PT): [place], [place]   Encounter Date: 10/11/2021   PT End of Session - 10/11/21 1336     Visit Number 3    Number of Visits 12    Date for PT Re-Evaluation 11/15/21    PT Start Time 1330    PT Stop Time 1410    PT Time Calculation (min) 40 min    Activity Tolerance Patient tolerated treatment well    Behavior During Therapy [facility] for tasks assessed/performed             Past Medical History:  Diagnosis Date   Anemia    Asthma    exercise induced   Cesarean delivery delivered 03/02/2016   Depression 2014   GERD (gastroesophageal reflux disease)    Headache    h/o migraines   PTSD (post-traumatic stress disorder) 2014   Thrombocytopenia (HCC)     Past Surgical History:  Procedure Laterality Date   CESAREAN SECTION N/A 03/01/2016   Procedure: CESAREAN SECTION;  Surgeon: Conard Novak, MD;  Location: [facility];  Service: Obstetrics;  Laterality: N/A;   CESAREAN SECTION N/A 02/11/2020   Procedure: CESAREAN SECTION;  Surgeon: Conard Novak, MD;  Location: [facility];  Service: Obstetrics;  Laterality: N/A;  RNFA   CESAREAN SECTION WITH BILATERAL TUBAL LIGATION N/A 09/14/2021   Procedure: CESAREAN SECTION WITH BILATERAL TUBAL LIGATION;  Surgeon: Conard Novak, MD;  Location: [facility] ORS;  Service: Obstetrics;  Laterality: N/A;    There were no vitals filed for this visit.   Subjective Assessment - 10/11/21 1333     Subjective Patient presents to clinic with RW; notes she is in more pain today. Patient states that she was a little sore after last session, but it resolved within a short amount of time. SHe did do her HEP including basic femoral nerve glides/flossing with good effect at  home. Patient states she has been on her feet a lot already today and is feeling 6/10 pain at RLE/R inguinal region.    Currently in Pain? Yes    Pain Score 6     Pain Location Hip    Pain Orientation Right;Anterior;Medial    Pain Descriptors / Indicators Sharp;Burning             TREATMENT Neuromuscular Re-education: Supine R hip rotations, PT assisted for graded exposure to movement and improved neural mobility Supine B hip adduction isometric with pillow for improved neuromuscular communication from the lumbar plexus Supine R heel slides for improved neuromuscular communication from the lumbar plexus Gait with SPC, cues for sequencing, CGA/SBA.  RW adjustment to appropriate height.  Manual Therapy: STM and TPR performed to R gastroc, R quad, and R adductor complex mm to allow for decreased tension and pain and improved posture and function  Treatments unbilled: MHP to R inguinal and RLQ during manual interventions  Patient educated throughout session on appropriate technique and form using multi-modal cueing, HEP, and activity modification. Patient articulated understanding and returned demonstration.  Patient Response to interventions: 4/10 pain  ASSESSMENT Patient presents to clinic with excellent motivation to participate in therapy. Patient demonstrates deficits in RLE strength, gait, pain, and function. Patient able to ambulate with [facility] for short distance in clinic, but requiring SBA/CGA and demonstrating increased reliance  on LUE making SPC use still an unsafe option during today's session and responded positively to manual and active interventions. Patient will benefit from continued skilled therapeutic intervention to address remaining deficits in RLE strength, gait, pain, and function in order to increase function and improve overall QOL.    PT Long Term Goals - 10/04/21 1509       PT LONG TERM GOAL #1   Title Patient will be independent with HEP in order to  improve strength and balance in order to decrease fall risk and improve function at home and in the community.    Baseline IE: not initiated    Time 6    Period Weeks    Status New    Target Date 11/15/21      PT LONG TERM GOAL #2   Title Patient will decrease 5TSTS to < 12 seconds without UE support nor compensatory patterns in order to demonstrate clinically significant improvement in LE strength and to approach peer normative values.    Baseline IE: 63.4 sec (LLE bias)    Time 6    Period Weeks    Status New    Target Date 11/15/21      PT LONG TERM GOAL #3   Title Patient will decrease TUG to below 14 seconds/decrease in order to demonstrate decreased fall risk.    Baseline IE: assess at next visit    Time 6    Period Weeks    Status New    Target Date 11/15/21      PT LONG TERM GOAL #4   Title Patient will increase by at least 5m (111ft) in order to demonstrate clinically significant improvement in cardiopulmonary endurance and community ambulation    Baseline IE: assess at next visit    Time 6    Period Weeks    Status New    Target Date 11/15/21      PT LONG TERM GOAL #5   Title Patient will demonstrate improved function as evidenced by a score of 68 on FOTO measure for full participation in activities at home and in the community.    Baseline IE: 50    Time 6    Period Weeks    Status New    Target Date 11/15/21                   Plan - 10/11/21 1423     Clinical Impression Statement Patient presents to clinic with excellent motivation to participate in therapy. Patient demonstrates deficits in RLE strength, gait, pain, and function. Patient able to ambulate with [facility] for short distance in clinic, but requiring SBA/CGA and demonstrating increased reliance on LUE making SPC use still an unsafe option during today's session and responded positively to manual and active interventions. Patient will benefit from continued skilled therapeutic intervention to  address remaining deficits in RLE strength, gait, pain, and function in order to increase function and improve overall QOL.    Personal Factors and Comorbidities Comorbidity 3+;Time since onset of injury/illness/exacerbation    Comorbidities anemia, asthma, depression, GERD, migraines, PTSD    Examination-Activity Limitations Bathing;Hygiene/Grooming;Toileting;Squat;Sleep;Transfers;Dressing;Stairs;Bend;Lift    Examination-Participation Restrictions Community Activity;Occupation;Driving;Meal Prep;Cleaning;Shop;Laundry    Stability/Clinical Decision Making Evolving/Moderate complexity    Rehab Potential Good    PT Frequency 2x / week    PT Duration 6 weeks    PT Treatment/Interventions ADLs/Self Care Home Management;Cryotherapy;Electrical Stimulation;Moist Heat;Neuromuscular re-education;Therapeutic exercise;Stair training;Gait training;Patient/family education;Orthotic Fit/Training;Manual techniques;Scar mobilization;Taping;Joint Manipulations;Spinal Manipulations    PT Next  Visit Plan --    PT Home Exercise Plan [email or web address]    Consulted and Agree with Plan of Care Patient             Patient will benefit from skilled therapeutic intervention in order to improve the following deficits and impairments:  Abnormal gait, Decreased balance, Decreased endurance, Decreased mobility, Difficulty walking, Pain, Decreased strength, Decreased coordination, Decreased activity tolerance, Decreased scar mobility  Visit Diagnosis: Difficulty in walking, not elsewhere classified  Muscle weakness (generalized)     Problem List Patient Active Problem List   Diagnosis Date Noted   History of cesarean delivery 09/14/2021   Encounter for sterilization 09/14/2021   [redacted] weeks gestation of pregnancy 09/14/2021   Back pain affecting pregnancy in third trimester 08/27/2021   Anemia    B12 deficiency 07/13/2021   Absolute anemia 07/13/2021   Thrombocytopenia affecting pregnancy, antepartum (HCC) 07/10/2021    Depressed mood 06/14/2021   Pelvic pain in pregnancy 06/14/2021   History of cesarean delivery affecting pregnancy 06/14/2021   Prenatal care, subsequent pregnancy, second trimester 02/23/2021   Nausea/vomiting in pregnancy 02/23/2021   Supervision of high risk pregnancy, antepartum 07/02/2019   PTSD (post-traumatic stress disorder) 01/08/2013   MDD (major depressive disorder), recurrent episode, severe (HCC) 01/08/2013    Sheria Lang PT, DPT [phone]  10/11/2021, 2:45 PM  [facility] [address] [place], [place], [address] Phone: [phone]   Fax:  [phone]  Name: Nicole Hartman MRN: [id number] Date of Birth: 04/27/1996

## 2021-10-16 ENCOUNTER — Encounter: Payer: Self-pay | Admitting: Oncology

## 2021-10-16 ENCOUNTER — Ambulatory Visit: Payer: Medicaid Other | Admitting: Physical Therapy

## 2021-10-17 ENCOUNTER — Other Ambulatory Visit: Payer: Self-pay | Admitting: Psychiatry

## 2021-10-17 ENCOUNTER — Encounter: Payer: Self-pay | Admitting: Obstetrics and Gynecology

## 2021-10-17 ENCOUNTER — Encounter: Payer: Self-pay | Admitting: Oncology

## 2021-10-17 ENCOUNTER — Other Ambulatory Visit: Payer: Self-pay

## 2021-10-17 ENCOUNTER — Ambulatory Visit (INDEPENDENT_AMBULATORY_CARE_PROVIDER_SITE_OTHER): Payer: Medicaid Other | Admitting: Obstetrics and Gynecology

## 2021-10-17 DIAGNOSIS — R29898 Other symptoms and signs involving the musculoskeletal system: Secondary | ICD-10-CM

## 2021-10-17 NOTE — Progress Notes (Signed)
Postpartum Visit  Chief Complaint:  Chief Complaint  Patient presents with   Post-op Follow-up    History of Present Illness: Patient is a 26 y.o. [id number] presents for postpartum visit.  Date of delivery: 09/14/2021 Type of delivery: Cesarean section  Breast Feeding:  yes Lochia:  normal Post partum depression/anxiety noted:  no  Date of last PAP: 2021 NIL   Any problems since the delivery:  Difficulty walking- Has been to physical therapy.  Reports she has had some improvement with walking since physical therapy and is using her walker slightly less often.  MRI has not resulted yet.  I spoke to [facility] radiology who changed the status of the report and requested a stat read.  Newborn Details:  SINGLETON :  Infant Status: Infant doing well at home with mother.   Review of Systems: Review of Systems  Constitutional:  Negative for chills, fever, malaise/fatigue and weight loss.  HENT:  Negative for congestion, hearing loss and sinus pain.   Eyes:  Negative for blurred vision and double vision.  Respiratory:  Negative for cough, sputum production, shortness of breath and wheezing.   Cardiovascular:  Negative for chest pain, palpitations, orthopnea and leg swelling.  Gastrointestinal:  Negative for abdominal pain, constipation, diarrhea, nausea and vomiting.  Genitourinary:  Negative for dysuria, flank pain, frequency, hematuria and urgency.  Musculoskeletal:  Negative for back pain, falls and joint pain.  Skin:  Negative for itching and rash.  Neurological:  Positive for weakness. Negative for dizziness and headaches.  Psychiatric/Behavioral:  Negative for depression, substance abuse and suicidal ideas. The patient is not nervous/anxious.    Past Medical History:  Past Medical History:  Diagnosis Date   Anemia    Asthma    exercise induced   Cesarean delivery delivered 03/02/2016   Depression 2014   GERD (gastroesophageal reflux disease)    Headache    h/o migraines    PTSD (post-traumatic stress disorder) 2014   Thrombocytopenia ([place])     Past Surgical History:  Past Surgical History:  Procedure Laterality Date   CESAREAN SECTION N/A 03/01/2016   Procedure: CESAREAN SECTION;  Surgeon: Will Bonnet, MD;  Location: [facility];  Service: Obstetrics;  Laterality: N/A;   CESAREAN SECTION N/A 02/11/2020   Procedure: CESAREAN SECTION;  Surgeon: Will Bonnet, MD;  Location: [facility];  Service: Obstetrics;  Laterality: N/A;  RNFA   CESAREAN SECTION WITH BILATERAL TUBAL LIGATION N/A 09/14/2021   Procedure: CESAREAN SECTION WITH BILATERAL TUBAL LIGATION;  Surgeon: Will Bonnet, MD;  Location: [facility] OR[facility];  Service: Obstetrics;  Laterality: N/A;    Family History:  Family History  Problem Relation Age of Onset   Healthy Mother    Healthy Father    Cancer - Colon Maternal Grandfather    Breast cancer Paternal Grandmother        59s   Breast cancer Maternal Aunt        not sure of age    Social History:  Social History   Socioeconomic History   Marital status: Married    Spouse name: Cindee Salt   Number of children: 3   Years of education: college   Highest education level: Not on file  Occupational History   Occupation: Art therapist  Tobacco Use   Smoking status: Never   Smokeless tobacco: Never  Vaping Use   Vaping Use: Never used  Substance and Sexual Activity   Alcohol use: No   Drug use: No   Sexual activity:  Yes    Partners: Male    Birth control/protection: Surgical  Other Topics Concern   Not on file  Social History Narrative   Lives at home with husband and three children.   Right-handed.   One cup caffeine per day.   Social Determinants of Health   Financial Resource Strain: Not on file  Food Insecurity: Not on file  Transportation Needs: Not on file  Physical Activity: Not on file  Stress: Not on file  Social Connections: Not on file  Intimate Partner Violence: Not on file    Allergies:  No Known  Allergies  Medications: Prior to Admission medications   Medication Sig Start Date End Date Taking? Authorizing Provider  albuterol (VENTOLIN HFA) 108 (90 Base) MCG/ACT inhaler Inhale 2 puffs into the lungs 3 (three) times daily as needed for wheezing (for cough).   Yes [provider]  calcium carbonate (TUMS - DOSED IN MG ELEMENTAL CALCIUM) 500 MG chewable tablet Chew 2 tablets (400 mg of elemental calcium total) by mouth daily as needed for indigestion or heartburn. 09/17/21  Yes Malachy Mood, MD  Ferrous Sulfate (IRON PO) Take 1 tablet by mouth daily.   Yes [provider]  ibuprofen (ADVIL) 600 MG tablet Take 1 tablet (600 mg total) by mouth every 6 (six) hours. 09/17/21  Yes Malachy Mood, MD  Prenatal Vit-Fe Fumarate-FA (PNV PRENATAL PLUS MULTIVITAMIN) 27-1 MG TABS Take 1 tablet by mouth daily.    Yes [provider]    Physical Exam Vitals:  Vitals:   10/17/21 1100  BP: 114/70    Physical Exam Constitutional:      Appearance: Normal appearance. She is well-developed.  HENT:     Head: Normocephalic and atraumatic.  Eyes:     Extraocular Movements: Extraocular movements intact.     Pupils: Pupils are equal, round, and reactive to light.  Neck:     Thyroid: No thyromegaly.  Cardiovascular:     Rate and Rhythm: Normal rate and regular rhythm.     Heart sounds: Normal heart sounds.  Pulmonary:     Effort: Pulmonary effort is normal.     Breath sounds: Normal breath sounds.  Abdominal:     General: Bowel sounds are normal. There is no distension.     Palpations: Abdomen is soft. There is no mass.     Comments: Incision is clean dry and intact  Musculoskeletal:     Cervical back: Neck supple.  Neurological:     Mental Status: She is alert and oriented to person, place, and time.     Motor: Weakness present.     Gait: Gait abnormal.  Skin:    General: Skin is warm and dry.  Psychiatric:        Behavior: Behavior normal.        Thought  Content: Thought content normal.        Judgment: Judgment normal.  Vitals reviewed.    Assessment: 26 y.o. [id number] presenting for 6 week postpartum visit  Plan: Problem List Items Addressed This Visit   None Visit Diagnoses     Postpartum care and examination    -  Primary       1) Contraception-  tubal ligation  2)  Pap: up to date  3) Patient underwent screening for postpartum depression with no concerns noted.  4) Continue follow up with Neurology and physical therapy for difficulties with ambulation. MRI report pending, should result today.   - Follow up in 2 months.  Adrian Prows MD, [facility], [facility] 10/17/2021 11:33 AM

## 2021-10-18 ENCOUNTER — Ambulatory Visit: Payer: Medicaid Other | Admitting: Physical Therapy

## 2021-10-18 ENCOUNTER — Encounter (INDEPENDENT_AMBULATORY_CARE_PROVIDER_SITE_OTHER): Payer: Medicaid Other | Admitting: Diagnostic Neuroimaging

## 2021-10-18 ENCOUNTER — Ambulatory Visit: Payer: Medicaid Other | Admitting: Diagnostic Neuroimaging

## 2021-10-18 DIAGNOSIS — R29898 Other symptoms and signs involving the musculoskeletal system: Secondary | ICD-10-CM | POA: Diagnosis not present

## 2021-10-18 DIAGNOSIS — Z0289 Encounter for other administrative examinations: Secondary | ICD-10-CM

## 2021-10-18 NOTE — Procedures (Signed)
[facility]  NCS (NERVE CONDUCTION STUDY) WITH EMG (ELECTROMYOGRAPHY) REPORT   STUDY DATE: 10/17/21 PATIENT NAME: [name] DOB: 1996-08-13 MRN: [id number]  ORDERING CLINICIAN: Ocie Doyne, MD   TECHNOLOGIST: Durenda Age ELECTROMYOGRAPHER: Glenford Bayley. Phillipe Clemon, MD  CLINICAL INFORMATION: 26 year old female with post-partum right leg pain, numbness and weakness.  FINDINGS: NERVE CONDUCTION STUDY:  Bilateral tibial and peroneal motor responses are normal.  Bilateral tibial F wave latency is normal.  Bilateral sural and superficial peroneal sensory responses are normal.    NEEDLE ELECTROMYOGRAPHY:  Needle examination of right lower extremity is normal.    IMPRESSION:   Normal study.  No electrodiagnostic evidence of large fiber neuropathy at this time.   INTERPRETING PHYSICIAN:  Suanne Marker, MD Certified in Neurology, Neurophysiology and Neuroimaging  [facility] [address] [place], [place] [address] [phone]   [facility]    Nerve / Sites Muscle Latency Ref. Amplitude Ref. Rel Amp Segments Distance Velocity Ref. Area    ms ms mV mV %  cm m/s m/s mVms  R Peroneal - EDB     Ankle EDB 3.5 ?6.5 6.1 ?2.0 100 Ankle - EDB 9   20.9     Fib head EDB 8.5  5.6  92.3 Fib head - Ankle 26 52 ?44 19.7     Pop fossa EDB 10.5  5.6  100 Pop fossa - Fib head 10 49 ?44 20.1         Pop fossa - Ankle      L Peroneal - EDB     Ankle EDB 3.6 ?6.5 5.4 ?2.0 100 Ankle - EDB 9   23.0     Fib head EDB 8.3  5.1  94.5 Fib head - Ankle 27 58 ?44 20.2     Pop fossa EDB 10.0  4.1  81 Pop fossa - Fib head 10 58 ?44 18.2         Pop fossa - Ankle      R Tibial - AH     Ankle AH 3.4 ?5.8 18.2 ?4.0 100 Ankle - AH 9   44.5     Pop fossa AH 10.1  16.2  89 Pop fossa - Ankle 36 53 ?41 43.4  L Tibial - AH     Ankle AH 2.8 ?5.8 14.1 ?4.0 100 Ankle - AH 9   34.6     Pop fossa AH 9.9  12.5  89.2 Pop fossa - Ankle 36 51 ?41 33.3              SNC    Nerve / Sites Rec. Site Peak Lat Ref.  Amp Ref. Segments Distance    ms ms V V  cm  R Sural - Ankle (Calf)     Calf Ankle 3.0 ?4.4 29 ?6 Calf - Ankle 14  L Sural - Ankle (Calf)     Calf Ankle 2.7 ?4.4 31 ?6 Calf - Ankle 14  R Superficial peroneal - Ankle     Lat leg Ankle 3.3 ?4.4 12 ?6 Lat leg - Ankle 14  L Superficial peroneal - Ankle     Lat leg Ankle 3.2 ?4.4 22 ?6 Lat leg - Ankle 14             F  Wave    Nerve F Lat Ref.   ms ms  R Tibial - AH 41.4 ?56.0  L Tibial - AH 40.4 ?56.0  EMG Summary Table    Spontaneous MUAP Recruitment  Muscle IA Fib PSW Fasc Other Amp Dur. Poly Pattern  R. Vastus medialis Normal None None None _______ Normal Normal Normal Normal  R. Tibialis anterior Normal None None None _______ Normal Normal Normal Normal  R. Gastrocnemius (Medial head) Normal None None None _______ Normal Normal Normal Normal

## 2021-10-23 ENCOUNTER — Encounter: Payer: Self-pay | Admitting: Physical Therapy

## 2021-10-23 ENCOUNTER — Other Ambulatory Visit: Payer: Self-pay

## 2021-10-23 ENCOUNTER — Ambulatory Visit: Payer: Medicaid Other | Attending: Obstetrics and Gynecology | Admitting: Physical Therapy

## 2021-10-23 DIAGNOSIS — R262 Difficulty in walking, not elsewhere classified: Secondary | ICD-10-CM | POA: Insufficient documentation

## 2021-10-23 DIAGNOSIS — M6281 Muscle weakness (generalized): Secondary | ICD-10-CM | POA: Diagnosis present

## 2021-10-23 NOTE — Therapy (Signed)
[facility] [address]. [place], [place], [address] Phone: [phone]   Fax:  [phone]  Physical Therapy Treatment  Patient Details  Name: Nicole Hartman MRN: [id number] Date of Birth: [birth year] Referring Provider (PT): [place], [place]   Encounter Date: 10/23/2021   PT End of Session - 10/23/21 0856     Visit Number 4    Number of Visits 12    Date for PT Re-Evaluation 11/15/21    PT Start Time 0900    PT Stop Time 0930    PT Time Calculation (min) 30 min    Activity Tolerance Patient tolerated treatment well;Patient limited by fatigue    Behavior During Therapy [facility] for tasks assessed/performed             Past Medical History:  Diagnosis Date   Anemia    Asthma    exercise induced   Cesarean delivery delivered 03/02/2016   Depression 2014   GERD (gastroesophageal reflux disease)    Headache    h/o migraines   PTSD (post-traumatic stress disorder) 2014   Thrombocytopenia (HCC)     Past Surgical History:  Procedure Laterality Date   CESAREAN SECTION N/A 03/01/2016   Procedure: CESAREAN SECTION;  Surgeon: Conard Novak, MD;  Location: [facility] ORS;  Service: Obstetrics;  Laterality: N/A;   CESAREAN SECTION N/A 02/11/2020   Procedure: CESAREAN SECTION;  Surgeon: Conard Novak, MD;  Location: [facility] ORS;  Service: Obstetrics;  Laterality: N/A;  RNFA   CESAREAN SECTION WITH BILATERAL TUBAL LIGATION N/A 09/14/2021   Procedure: CESAREAN SECTION WITH BILATERAL TUBAL LIGATION;  Surgeon: Conard Novak, MD;  Location: [facility] ORS;  Service: Obstetrics;  Laterality: N/A;    There were no vitals filed for this visit.   Subjective Assessment - 10/23/21 0903     Subjective Patient presents to clinic with RW; notes she is in more pain today. Patient states that she continues to have sharp pain with certain R lateral trunk flexion tasks like donnign pants. Patient also has sharp pain with activation of TrA  muscle.    Currently in Pain? Yes    Pain Score 3              TREATMENT Therapeutic Exercise: Supine R heel slides with strap assist to fatigue Gait with SPC, CGA/SBA. 70 ft x multiple bouts   Manual Therapy: STM and MFR performed to abdominal wall superior to scar site to allow for decreased tension and pain and improved posture and function Patient educated on scar mobilization for desensitization and improved mobility.  Treatments unbilled: MHP to R inguinal and RLQ during manual interventions  Patient educated throughout session on appropriate technique and form using multi-modal cueing, HEP, and activity modification. Patient articulated understanding and returned demonstration.  Patient Response to interventions: 4/10 pain  ASSESSMENT Patient presents to clinic with excellent motivation to participate in therapy. Patient demonstrates deficits in RLE strength, gait, pain, and function. Patient able to ambulate with [facility] for increased distance with improved load acceptance on RLE during today's session but had one instance of R knee buckling with independent recovery and responded positively to manual and active interventions. Patient will benefit from continued skilled therapeutic intervention to address remaining deficits in RLE strength, gait, pain, and function in order to increase function and improve overall QOL.    PT Long Term Goals - 10/04/21 1509       PT LONG TERM GOAL #1   Title Patient will  be independent with HEP in order to improve strength and balance in order to decrease fall risk and improve function at home and in the community.    Baseline IE: not initiated    Time 6    Period Weeks    Status New    Target Date 11/15/21      PT LONG TERM GOAL #2   Title Patient will decrease 5TSTS to < 12 seconds without UE support nor compensatory patterns in order to demonstrate clinically significant improvement in LE strength and to approach peer normative  values.    Baseline IE: 63.4 sec (LLE bias)    Time 6    Period Weeks    Status New    Target Date 11/15/21      PT LONG TERM GOAL #3   Title Patient will decrease TUG to below 14 seconds/decrease in order to demonstrate decreased fall risk.    Baseline IE: assess at next visit    Time 6    Period Weeks    Status New    Target Date 11/15/21      PT LONG TERM GOAL #4   Title Patient will increase 6MWT by at least 586m (15864ft) in order to demonstrate clinically significant improvement in cardiopulmonary endurance and community ambulation    Baseline IE: assess at next visit    Time 6    Period Weeks    Status New    Target Date 11/15/21      PT LONG TERM GOAL #5   Title Patient will demonstrate improved function as evidenced by a score of 68 on FOTO measure for full participation in activities at home and in the community.    Baseline IE: 50    Time 6    Period Weeks    Status New    Target Date 11/15/21                   Plan - 10/23/21 0857     Clinical Impression Statement Patient presents to clinic with excellent motivation to participate in therapy. Patient demonstrates deficits in RLE strength, gait, pain, and function. Patient able to ambulate with [facility] for increased distance with improved load acceptance on RLE during today's session but had one instance of R knee buckling with independent recovery and responded positively to manual and active interventions. Patient will benefit from continued skilled therapeutic intervention to address remaining deficits in RLE strength, gait, pain, and function in order to increase function and improve overall QOL.    Personal Factors and Comorbidities Comorbidity 3+;Time since onset of injury/illness/exacerbation    Comorbidities anemia, asthma, depression, GERD, migraines, PTSD    Examination-Activity Limitations Bathing;Hygiene/Grooming;Toileting;Squat;Sleep;Transfers;Dressing;Stairs;Bend;Lift    Examination-Participation  Restrictions Community Activity;Occupation;Driving;Meal Prep;Cleaning;Shop;Laundry    Stability/Clinical Decision Making Evolving/Moderate complexity    Rehab Potential Good    PT Frequency 2x / week    PT Duration 6 weeks    PT Treatment/Interventions ADLs/Self Care Home Management;Cryotherapy;Electrical Stimulation;Moist Heat;Neuromuscular re-education;Therapeutic exercise;Stair training;Gait training;Patient/family education;Orthotic Fit/Training;Manual techniques;Scar mobilization;Taping;Joint Manipulations;Spinal Manipulations    PT Home Exercise Plan [email or web address]    Consulted and Agree with Plan of Care Patient             Patient will benefit from skilled therapeutic intervention in order to improve the following deficits and impairments:  Abnormal gait, Decreased balance, Decreased endurance, Decreased mobility, Difficulty walking, Pain, Decreased strength, Decreased coordination, Decreased activity tolerance, Decreased scar mobility  Visit Diagnosis: Difficulty in walking, not elsewhere classified  Muscle  weakness (generalized)     Problem List Patient Active Problem List   Diagnosis Date Noted   History of cesarean delivery 09/14/2021   Encounter for sterilization 09/14/2021   [redacted] weeks gestation of pregnancy 09/14/2021   Back pain affecting pregnancy in third trimester 08/27/2021   Anemia    B12 deficiency 07/13/2021   Absolute anemia 07/13/2021   Thrombocytopenia affecting pregnancy, antepartum (HCC) 07/10/2021   Depressed mood 06/14/2021   Pelvic pain in pregnancy 06/14/2021   History of cesarean delivery affecting pregnancy 06/14/2021   Prenatal care, subsequent pregnancy, second trimester 02/23/2021   Nausea/vomiting in pregnancy 02/23/2021   Supervision of high risk pregnancy, antepartum 07/02/2019   PTSD (post-traumatic stress disorder) 01/08/2013   MDD (major depressive disorder), recurrent episode, severe (HCC) 01/08/2013    Sheria Lang PT, DPT [phone]   10/23/2021, 1:12 PM  [facility] [address] [place], [place], [address] Phone: [phone]   Fax:  [phone]  Name: Nicole Hartman MRN: [id number] Date of Birth: 1996-04-21

## 2021-10-25 ENCOUNTER — Encounter: Payer: Medicaid Other | Admitting: Physical Therapy

## 2021-10-30 ENCOUNTER — Encounter: Payer: Medicaid Other | Admitting: Physical Therapy

## 2021-10-31 ENCOUNTER — Other Ambulatory Visit: Payer: Self-pay | Admitting: *Deleted

## 2021-10-31 DIAGNOSIS — D508 Other iron deficiency anemias: Secondary | ICD-10-CM

## 2021-11-01 ENCOUNTER — Other Ambulatory Visit: Payer: Self-pay

## 2021-11-01 ENCOUNTER — Ambulatory Visit: Payer: Medicaid Other | Admitting: Physical Therapy

## 2021-11-01 ENCOUNTER — Encounter: Payer: Self-pay | Admitting: Physical Therapy

## 2021-11-01 DIAGNOSIS — R262 Difficulty in walking, not elsewhere classified: Secondary | ICD-10-CM | POA: Diagnosis not present

## 2021-11-01 DIAGNOSIS — M6281 Muscle weakness (generalized): Secondary | ICD-10-CM

## 2021-11-01 NOTE — Therapy (Signed)
[facility] [address] [place], [place], [address] Phone: [phone]   Fax:  [phone]  Physical Therapy Treatment  Patient Details  Name: Nicole Hartman MRN: [id number] Date of Birth: 08/09/1996 Referring Provider (PT): [place], [place]   Encounter Date: 11/01/2021   PT End of Session - 11/01/21 1329     Visit Number 5    Number of Visits 12    Date for PT Re-Evaluation 11/15/21    PT Start Time 1330    PT Stop Time 1410    PT Time Calculation (min) 40 min    Activity Tolerance Patient tolerated treatment well;Patient limited by fatigue    Behavior During Therapy [facility] for tasks assessed/performed             Past Medical History:  Diagnosis Date   Anemia    Asthma    exercise induced   Cesarean delivery delivered 03/02/2016   Depression 2014   GERD (gastroesophageal reflux disease)    Headache    h/o migraines   PTSD (post-traumatic stress disorder) 2014   Thrombocytopenia (HCC)     Past Surgical History:  Procedure Laterality Date   CESAREAN SECTION N/A 03/01/2016   Procedure: CESAREAN SECTION;  Surgeon: Conard Novak, MD;  Location: [facility] ORS;  Service: Obstetrics;  Laterality: N/A;   CESAREAN SECTION N/A 02/11/2020   Procedure: CESAREAN SECTION;  Surgeon: Conard Novak, MD;  Location: [facility] ORS;  Service: Obstetrics;  Laterality: N/A;  RNFA   CESAREAN SECTION WITH BILATERAL TUBAL LIGATION N/A 09/14/2021   Procedure: CESAREAN SECTION WITH BILATERAL TUBAL LIGATION;  Surgeon: Conard Novak, MD;  Location: [facility] ORS;  Service: Obstetrics;  Laterality: N/A;    There were no vitals filed for this visit.   Subjective Assessment - 11/01/21 1421     Subjective Patient presents to clinic without RW and notes she has been able to do more walking without the RW. Patient was able to attend church this past weekend and used stroller as AD for walking without difficulty.    Currently in Pain? Yes               TREATMENT Therapeutic Exercise: SciFit, seated elliptical for warm-up, during history taking.  6" stair taps to fatigue, BUE support, BLE 6" stair taps with 3# CW to fatigue, BUE support, BLE LAQ 3# CW x15, BLE, tactile cues as needed Standing hamstring curl, x10 BLE, 3# CW, tactile cueing as needed  Neuromuscular Re-education: Supine hooklying, TrA activation with exhalation. VCs and TCs to decrease compensatory patterns and minimize aggravation of the lumbar paraspinals. Supine hooklying TrA activation with UE OH reach for postural control challenge. VCs and TCs to decrease compensatory patterns Supine hooklying TrA activation with alternating bent knee fall out for postural control challenge. VCs and TCs to decrease compensatory patterns   Patient educated throughout session on appropriate technique and form using multi-modal cueing, HEP, and activity modification. Patient articulated understanding and returned demonstration.  Patient Response to interventions: 5/10 pain  ASSESSMENT Patient presents to clinic with excellent motivation to participate in therapy. Patient demonstrates deficits in RLE strength, gait, pain, and function. Patient with excellent effort with all active interventions during today's session and responded positively to tactile cueing during active interventions. Patient will benefit from continued skilled therapeutic intervention to address remaining deficits in RLE strength, gait, pain, and function in order to increase function and improve overall QOL.    PT Long Term Goals - 10/04/21  1509       PT LONG TERM GOAL #1   Title Patient will be independent with HEP in order to improve strength and balance in order to decrease fall risk and improve function at home and in the community.    Baseline IE: not initiated    Time 6    Period Weeks    Status New    Target Date 11/15/21      PT LONG TERM GOAL #2   Title Patient will decrease 5TSTS to <  12 seconds without UE support nor compensatory patterns in order to demonstrate clinically significant improvement in LE strength and to approach peer normative values.    Baseline IE: 63.4 sec (LLE bias)    Time 6    Period Weeks    Status New    Target Date 11/15/21      PT LONG TERM GOAL #3   Title Patient will decrease TUG to below 14 seconds/decrease in order to demonstrate decreased fall risk.    Baseline IE: assess at next visit    Time 6    Period Weeks    Status New    Target Date 11/15/21      PT LONG TERM GOAL #4   Title Patient will increase by at least 81m (150ft) in order to demonstrate clinically significant improvement in cardiopulmonary endurance and community ambulation    Baseline IE: assess at next visit    Time 6    Period Weeks    Status New    Target Date 11/15/21      PT LONG TERM GOAL #5   Title Patient will demonstrate improved function as evidenced by a score of 68 on FOTO measure for full participation in activities at home and in the community.    Baseline IE: 50    Time 6    Period Weeks    Status New    Target Date 11/15/21                   Plan - 11/01/21 1329     Clinical Impression Statement Patient presents to clinic with excellent motivation to participate in therapy. Patient demonstrates deficits in RLE strength, gait, pain, and function. Patient with excellent effort with all active interventions during today's session and responded positively to tactile cueing during active interventions. Patient will benefit from continued skilled therapeutic intervention to address remaining deficits in RLE strength, gait, pain, and function in order to increase function and improve overall QOL.    Personal Factors and Comorbidities Comorbidity 3+;Time since onset of injury/illness/exacerbation    Comorbidities anemia, asthma, depression, GERD, migraines, PTSD    Examination-Activity Limitations  Bathing;Hygiene/Grooming;Toileting;Squat;Sleep;Transfers;Dressing;Stairs;Bend;Lift    Examination-Participation Restrictions Community Activity;Occupation;Driving;Meal Prep;Cleaning;Shop;Laundry    Stability/Clinical Decision Making Evolving/Moderate complexity    Rehab Potential Good    PT Frequency 2x / week    PT Duration 6 weeks    PT Treatment/Interventions ADLs/Self Care Home Management;Cryotherapy;Electrical Stimulation;Moist Heat;Neuromuscular re-education;Therapeutic exercise;Stair training;Gait training;Patient/family education;Orthotic Fit/Training;Manual techniques;Scar mobilization;Taping;Joint Manipulations;Spinal Manipulations    PT Home Exercise Plan [email or web address]    Consulted and Agree with Plan of Care Patient             Patient will benefit from skilled therapeutic intervention in order to improve the following deficits and impairments:  Abnormal gait, Decreased balance, Decreased endurance, Decreased mobility, Difficulty walking, Pain, Decreased strength, Decreased coordination, Decreased activity tolerance, Decreased scar mobility  Visit Diagnosis: Difficulty in walking, not elsewhere classified  Muscle  weakness (generalized)     Problem List Patient Active Problem List   Diagnosis Date Noted   History of cesarean delivery 09/14/2021   Encounter for sterilization 09/14/2021   [redacted] weeks gestation of pregnancy 09/14/2021   Back pain affecting pregnancy in third trimester 08/27/2021   Anemia    B12 deficiency 07/13/2021   Absolute anemia 07/13/2021   Thrombocytopenia affecting pregnancy, antepartum (HCC) 07/10/2021   Depressed mood 06/14/2021   Pelvic pain in pregnancy 06/14/2021   History of cesarean delivery affecting pregnancy 06/14/2021   Prenatal care, subsequent pregnancy, second trimester 02/23/2021   Nausea/vomiting in pregnancy 02/23/2021   Supervision of high risk pregnancy, antepartum 07/02/2019   PTSD (post-traumatic stress disorder) 01/08/2013    MDD (major depressive disorder), recurrent episode, severe (HCC) 01/08/2013    Sheria LangKatlin Xan Sparkman PT, DPT [phone]  11/01/2021, 2:31 PM  [place] [facility] [address] [place], [place], [address] Phone: [phone]   Fax:  [phone]  Name: Nicole Hartman MRN: [id number] Date of Birth: 18-Aug-1996

## 2021-11-06 ENCOUNTER — Other Ambulatory Visit: Payer: Self-pay

## 2021-11-06 ENCOUNTER — Encounter: Payer: Self-pay | Admitting: Physical Therapy

## 2021-11-06 ENCOUNTER — Ambulatory Visit: Payer: Medicaid Other | Admitting: Physical Therapy

## 2021-11-06 DIAGNOSIS — R262 Difficulty in walking, not elsewhere classified: Secondary | ICD-10-CM | POA: Diagnosis not present

## 2021-11-06 DIAGNOSIS — M6281 Muscle weakness (generalized): Secondary | ICD-10-CM

## 2021-11-06 NOTE — Therapy (Signed)
[facility] [address] [place], [place], [address] Phone: [phone]   Fax:  [phone]  Physical Therapy Treatment  Patient Details  Name: Nicole KickCecilia N Mesa MRN: [id number] Date of Birth: Mar 29, 1996 Referring Provider (PT): [place], [place]   Encounter Date: 11/06/2021   PT End of Session - 11/06/21 1434     Visit Number 6    Number of Visits 12    Date for PT Re-Evaluation 11/15/21    PT Start Time 1330    PT Stop Time 1410    PT Time Calculation (min) 40 min    Activity Tolerance Patient tolerated treatment well    Behavior During Therapy [facility] for tasks assessed/performed             Past Medical History:  Diagnosis Date   Anemia    Asthma    exercise induced   Cesarean delivery delivered 03/02/2016   Depression 2014   GERD (gastroesophageal reflux disease)    Headache    h/o migraines   PTSD (post-traumatic stress disorder) 2014   Thrombocytopenia (HCC)     Past Surgical History:  Procedure Laterality Date   CESAREAN SECTION N/A 03/01/2016   Procedure: CESAREAN SECTION;  Surgeon: Conard NovakStephen D Jackson, MD;  Location: [facility]S;  Service: Obstetrics;  Laterality: N/A;   CESAREAN SECTION N/A 02/11/2020   Procedure: CESAREAN SECTION;  Surgeon: Conard NovakJackson, Stephen D, MD;  Location: [facility] ORS;  Service: Obstetrics;  Laterality: N/A;  RNFA   CESAREAN SECTION WITH BILATERAL TUBAL LIGATION N/A 09/14/2021   Procedure: CESAREAN SECTION WITH BILATERAL TUBAL LIGATION;  Surgeon: Conard NovakJackson, Stephen D, MD;  Location: [facility] ORS;  Service: Obstetrics;  Laterality: N/A;    There were no vitals filed for this visit.   Subjective Assessment - 11/06/21 1433     Subjective Patient presents to clinic without RW. Patient states that she overdid a bit since the last session when she was holding her 9211 month old nephew. Patient notes that she continues to have paresthesias in RLE with prolonged standing postures.    Currently in Pain? Yes     Pain Score 3     Pain Location Hip    Pain Orientation Right;Anterior;Medial    Pain Descriptors / Indicators Tingling;Numbness            TREATMENT Therapeutic Exercise: SciFit, seated elliptical for warm-up, during history taking.  6" stair taps to fatigue, BUE support, BLE Seated hip abduction/ER, GTB, x15, x10  Neuromuscular Re-education: Postural education on sleeping/resting postures for improved spinal alignment.  Sidelying R QL stretch with bolster under flank for improved spinal alignment; gentle PT assist at IC Doorway R QL stretch for improved posture Seated mermaid stretch R for improved postural awareness; performed in front of mirror for improved independence Standing postural centering with mirror feedback   Patient educated throughout session on appropriate technique and form using multi-modal cueing, HEP, and activity modification. Patient articulated understanding and returned demonstration.  Patient Response to interventions: 2/10 pain  ASSESSMENT Patient presents to clinic with excellent motivation to participate in therapy. Patient demonstrates deficits in RLE strength, gait, pain, and function. Patient able to tolerate postural corrective interventions without adverse pain response during today's session and responded positively to all active interventions. Patient will benefit from continued skilled therapeutic intervention to address remaining deficits in RLE strength, gait, pain, and function in order to increase function and improve overall QOL.   PT Long Term Goals - 10/04/21 [id number]  PT LONG TERM GOAL #1   Title Patient will be independent with HEP in order to improve strength and balance in order to decrease fall risk and improve function at home and in the community.    Baseline IE: not initiated    Time 6    Period Weeks    Status New    Target Date 11/15/21      PT LONG TERM GOAL #2   Title Patient will decrease 5TSTS to < 12 seconds  without UE support nor compensatory patterns in order to demonstrate clinically significant improvement in LE strength and to approach peer normative values.    Baseline IE: 63.4 sec (LLE bias)    Time 6    Period Weeks    Status New    Target Date 11/15/21      PT LONG TERM GOAL #3   Title Patient will decrease TUG to below 14 seconds/decrease in order to demonstrate decreased fall risk.    Baseline IE: assess at next visit    Time 6    Period Weeks    Status New    Target Date 11/15/21      PT LONG TERM GOAL #4   Title Patient will increase by at least 57m (170ft) in order to demonstrate clinically significant improvement in cardiopulmonary endurance and community ambulation    Baseline IE: assess at next visit    Time 6    Period Weeks    Status New    Target Date 11/15/21      PT LONG TERM GOAL #5   Title Patient will demonstrate improved function as evidenced by a score of 68 on FOTO measure for full participation in activities at home and in the community.    Baseline IE: 50    Time 6    Period Weeks    Status New    Target Date 11/15/21                   Plan - 11/06/21 1435     Clinical Impression Statement Patient presents to clinic with excellent motivation to participate in therapy. Patient demonstrates deficits in RLE strength, gait, pain, and function. Patient able to tolerate postural corrective interventions without adverse pain response during today's session and responded positively to all active interventions. Patient will benefit from continued skilled therapeutic intervention to address remaining deficits in RLE strength, gait, pain, and function in order to increase function and improve overall QOL.    Personal Factors and Comorbidities Comorbidity 3+;Time since onset of injury/illness/exacerbation    Comorbidities anemia, asthma, depression, GERD, migraines, PTSD    Examination-Activity Limitations  Bathing;Hygiene/Grooming;Toileting;Squat;Sleep;Transfers;Dressing;Stairs;Bend;Lift    Examination-Participation Restrictions Community Activity;Occupation;Driving;Meal Prep;Cleaning;Shop;Laundry    Stability/Clinical Decision Making Evolving/Moderate complexity    Rehab Potential Good    PT Frequency 2x / week    PT Duration 6 weeks    PT Treatment/Interventions ADLs/Self Care Home Management;Cryotherapy;Electrical Stimulation;Moist Heat;Neuromuscular re-education;Therapeutic exercise;Stair training;Gait training;Patient/family education;Orthotic Fit/Training;Manual techniques;Scar mobilization;Taping;Joint Manipulations;Spinal Manipulations    PT Home Exercise Plan [id number]; [id number]    Consulted and Agree with Plan of Care Patient             Patient will benefit from skilled therapeutic intervention in order to improve the following deficits and impairments:  Abnormal gait, Decreased balance, Decreased endurance, Decreased mobility, Difficulty walking, Pain, Decreased strength, Decreased coordination, Decreased activity tolerance, Decreased scar mobility  Visit Diagnosis: Difficulty in walking, not elsewhere classified  Muscle weakness (generalized)  Problem List Patient Active Problem List   Diagnosis Date Noted   History of cesarean delivery 09/14/2021   Encounter for sterilization 09/14/2021   [redacted] weeks gestation of pregnancy 09/14/2021   Back pain affecting pregnancy in third trimester 08/27/2021   Anemia    B12 deficiency 07/13/2021   Absolute anemia 07/13/2021   Thrombocytopenia affecting pregnancy, antepartum (HCC) 07/10/2021   Depressed mood 06/14/2021   Pelvic pain in pregnancy 06/14/2021   History of cesarean delivery affecting pregnancy 06/14/2021   Prenatal care, subsequent pregnancy, second trimester 02/23/2021   Nausea/vomiting in pregnancy 02/23/2021   Supervision of high risk pregnancy, antepartum 07/02/2019   PTSD (post-traumatic stress disorder)  01/08/2013   MDD (major depressive disorder), recurrent episode, severe (HCC) 01/08/2013    Sheria Lang PT, DPT [phone]  11/06/2021, 2:44 PM  [facility] [place] [address] [place], [place], [address] Phone: [phone]   Fax:  [phone]  Name: YAMILEE HARMES MRN: [id number] Date of Birth: Feb 20, 1996

## 2021-11-08 ENCOUNTER — Ambulatory Visit: Payer: Medicaid Other | Admitting: Physical Therapy

## 2021-11-08 ENCOUNTER — Encounter: Payer: Medicaid Other | Admitting: Physical Therapy

## 2021-11-08 ENCOUNTER — Other Ambulatory Visit: Payer: Self-pay

## 2021-11-08 DIAGNOSIS — M6281 Muscle weakness (generalized): Secondary | ICD-10-CM

## 2021-11-08 DIAGNOSIS — R262 Difficulty in walking, not elsewhere classified: Secondary | ICD-10-CM

## 2021-11-08 NOTE — Therapy (Signed)
[facility] [address] [place], [place], [address] Phone: [phone]   Fax:  [phone]  Physical Therapy Treatment  Patient Details  Name: Nicole Hartman MRN: [id number] Date of Birth: 10-Sep-1996 Referring Provider (PT): [place], [place]   Encounter Date: 11/08/2021   PT End of Session - 11/08/21 1531     Visit Number 7    Number of Visits 12    Date for PT Re-Evaluation 11/15/21    PT Start Time [id number]    PT Stop Time [id number]    PT Time Calculation (min) 38 min    Activity Tolerance Patient tolerated treatment well    Behavior During Therapy [facility] for tasks assessed/performed             Past Medical History:  Diagnosis Date   Anemia    Asthma    exercise induced   Cesarean delivery delivered 03/02/2016   Depression 2014   GERD (gastroesophageal reflux disease)    Headache    h/o migraines   PTSD (post-traumatic stress disorder) 2014   Thrombocytopenia ([place])     Past Surgical History:  Procedure Laterality Date   CESAREAN SECTION N/A 03/01/2016   Procedure: CESAREAN SECTION;  Surgeon: Will Bonnet, MD;  Location: [facility];  Service: Obstetrics;  Laterality: N/A;   CESAREAN SECTION N/A 02/11/2020   Procedure: CESAREAN SECTION;  Surgeon: Will Bonnet, MD;  Location: [facility];  Service: Obstetrics;  Laterality: N/A;  RNFA   CESAREAN SECTION WITH BILATERAL TUBAL LIGATION N/A 09/14/2021   Procedure: CESAREAN SECTION WITH BILATERAL TUBAL LIGATION;  Surgeon: Will Bonnet, MD;  Location: [facility];  Service: Obstetrics;  Laterality: N/A;    There were no vitals filed for this visit.   Subjective Assessment - 11/08/21 1528     Subjective Patient presents to clinic without RW; arrives late but ready for therapy. Patient notes that she did work on postural HEP a bit yesterday and that she was fairly sore from last session.    Currently in Pain? Yes            TREATMENT Therapeutic  Exercise: SciFit, seated elliptical for warm-up, during history taking.  6" lateral step up x10 each, TCs on RLE for quad control, BUE support Total gym squat, x10 TCs on RLE for quad control Total gym heel raises, x10 Total sym sumo squat, x5, VCs for exhalation with BLE extension Supine R quads sets, 5 sec hold x5 L SLR x10 Modified UE dead bug for core stability TrA activation with BUE OH reach for core stability Modified ipsilateral deadbug isometric, 5 sec x10 each side   Patient educated throughout session on appropriate technique and form using multi-modal cueing, HEP, and activity modification. Patient articulated understanding and returned demonstration.  Patient Response to interventions: 5/10 pain  ASSESSMENT Patient presents to clinic with excellent motivation to participate in therapy. Patient demonstrates deficits in RLE strength, gait, pain, and function. Patient with much improved quad strength and control performing total gym squats with external cueing and breath cueing during today's session and responded positively to all active interventions. Patient will benefit from continued skilled therapeutic intervention to address remaining deficits in RLE strength, gait, pain, and function in order to increase function and improve overall QOL.    PT Long Term Goals - 10/04/21 1509       PT LONG TERM GOAL #1   Title Patient will be independent with HEP in order to improve strength and  balance in order to decrease fall risk and improve function at home and in the community.    Baseline IE: not initiated    Time 6    Period Weeks    Status New    Target Date 11/15/21      PT LONG TERM GOAL #2   Title Patient will decrease 5TSTS to < 12 seconds without UE support nor compensatory patterns in order to demonstrate clinically significant improvement in LE strength and to approach peer normative values.    Baseline IE: 63.4 sec (LLE bias)    Time 6    Period Weeks    Status  New    Target Date 11/15/21      PT LONG TERM GOAL #3   Title Patient will decrease TUG to below 14 seconds/decrease in order to demonstrate decreased fall risk.    Baseline IE: assess at next visit    Time 6    Period Weeks    Status New    Target Date 11/15/21      PT LONG TERM GOAL #4   Title Patient will increase 6MWT by at least 54m (177ft) in order to demonstrate clinically significant improvement in cardiopulmonary endurance and community ambulation    Baseline IE: assess at next visit    Time 6    Period Weeks    Status New    Target Date 11/15/21      PT LONG TERM GOAL #5   Title Patient will demonstrate improved function as evidenced by a score of 68 on FOTO measure for full participation in activities at home and in the community.    Baseline IE: 50    Time 6    Period Weeks    Status New    Target Date 11/15/21                   Plan - 11/08/21 1532     Clinical Impression Statement Patient presents to clinic with excellent motivation to participate in therapy. Patient demonstrates deficits in RLE strength, gait, pain, and function. Patient with much improved quad strength and control performing total gym squats with external cueing and breath cueing during today's session and responded positively to all active interventions. Patient will benefit from continued skilled therapeutic intervention to address remaining deficits in RLE strength, gait, pain, and function in order to increase function and improve overall QOL.    Personal Factors and Comorbidities Comorbidity 3+;Time since onset of injury/illness/exacerbation    Comorbidities anemia, asthma, depression, GERD, migraines, PTSD    Examination-Activity Limitations Bathing;Hygiene/Grooming;Toileting;Squat;Sleep;Transfers;Dressing;Stairs;Bend;Lift    Examination-Participation Restrictions Community Activity;Occupation;Driving;Meal Prep;Cleaning;Shop;Laundry    Stability/Clinical Decision Making  Evolving/Moderate complexity    Rehab Potential Good    PT Frequency 2x / week    PT Duration 6 weeks    PT Treatment/Interventions ADLs/Self Care Home Management;Cryotherapy;Electrical Stimulation;Moist Heat;Neuromuscular re-education;Therapeutic exercise;Stair training;Gait training;Patient/family education;Orthotic Fit/Training;Manual techniques;Scar mobilization;Taping;Joint Manipulations;Spinal Manipulations    PT Home Exercise Plan [email or web address]; [id number]    Consulted and Agree with Plan of Care Patient             Patient will benefit from skilled therapeutic intervention in order to improve the following deficits and impairments:  Abnormal gait, Decreased balance, Decreased endurance, Decreased mobility, Difficulty walking, Pain, Decreased strength, Decreased coordination, Decreased activity tolerance, Decreased scar mobility  Visit Diagnosis: Difficulty in walking, not elsewhere classified  Muscle weakness (generalized)     Problem List Patient Active Problem List   Diagnosis Date Noted  History of cesarean delivery 09/14/2021   Encounter for sterilization 09/14/2021   [redacted] weeks gestation of pregnancy 09/14/2021   Back pain affecting pregnancy in third trimester 08/27/2021   Anemia    B12 deficiency 07/13/2021   Absolute anemia 07/13/2021   Thrombocytopenia affecting pregnancy, antepartum ([place]) 07/10/2021   Depressed mood 06/14/2021   Pelvic pain in pregnancy 06/14/2021   History of cesarean delivery affecting pregnancy 06/14/2021   Prenatal care, subsequent pregnancy, second trimester 02/23/2021   Nausea/vomiting in pregnancy 02/23/2021   Supervision of high risk pregnancy, antepartum 07/02/2019   PTSD (post-traumatic stress disorder) 01/08/2013   MDD (major depressive disorder), recurrent episode, severe (HCC) 01/08/2013    Myles Gip PT, DPT [phone]  11/08/2021, 3:39 PM  [place] [facility] [address] [place],  [place], [address] Phone: [phone]   Fax:  [phone]  Name: Nicole Hartman MRN: [id number] Date of Birth: 03/03/1996

## 2021-11-09 ENCOUNTER — Inpatient Hospital Stay: Payer: Medicaid Other | Attending: Oncology

## 2021-11-09 ENCOUNTER — Encounter: Payer: Self-pay | Admitting: Oncology

## 2021-11-09 DIAGNOSIS — E538 Deficiency of other specified B group vitamins: Secondary | ICD-10-CM | POA: Diagnosis not present

## 2021-11-09 DIAGNOSIS — D508 Other iron deficiency anemias: Secondary | ICD-10-CM | POA: Diagnosis present

## 2021-11-09 DIAGNOSIS — D696 Thrombocytopenia, unspecified: Secondary | ICD-10-CM | POA: Diagnosis not present

## 2021-11-09 DIAGNOSIS — O99280 Endocrine, nutritional and metabolic diseases complicating pregnancy, unspecified trimester: Secondary | ICD-10-CM | POA: Diagnosis not present

## 2021-11-09 DIAGNOSIS — O99013 Anemia complicating pregnancy, third trimester: Secondary | ICD-10-CM | POA: Insufficient documentation

## 2021-11-09 DIAGNOSIS — O99119 Other diseases of the blood and blood-forming organs and certain disorders involving the immune mechanism complicating pregnancy, unspecified trimester: Secondary | ICD-10-CM | POA: Diagnosis not present

## 2021-11-09 LAB — CBC WITH DIFFERENTIAL/PLATELET
Abs Immature Granulocytes: 0.01 10*3/uL (ref 0.00–0.07)
Basophils Absolute: 0 10*3/uL (ref 0.0–0.1)
Basophils Relative: 1 %
Eosinophils Absolute: 0.3 10*3/uL (ref 0.0–0.5)
Eosinophils Relative: 6 %
HCT: 38.6 % (ref 36.0–46.0)
Hemoglobin: 12.7 g/dL (ref 12.0–15.0)
Immature Granulocytes: 0 %
Lymphocytes Relative: 34 %
Lymphs Abs: 1.7 10*3/uL (ref 0.7–4.0)
MCH: 29.6 pg (ref 26.0–34.0)
MCHC: 32.9 g/dL (ref 30.0–36.0)
MCV: 90 fL (ref 80.0–100.0)
Monocytes Absolute: 0.3 10*3/uL (ref 0.1–1.0)
Monocytes Relative: 5 %
Neutro Abs: 2.8 10*3/uL (ref 1.7–7.7)
Neutrophils Relative %: 54 %
Platelets: 155 10*3/uL (ref 150–400)
RBC: 4.29 MIL/uL (ref 3.87–5.11)
RDW: 12.3 % (ref 11.5–15.5)
WBC: 5.2 10*3/uL (ref 4.0–10.5)
nRBC: 0 % (ref 0.0–0.2)

## 2021-11-09 LAB — IRON AND TIBC
Iron: 128 ug/dL (ref 28–170)
Saturation Ratios: 32 % — ABNORMAL HIGH (ref 10.4–31.8)
TIBC: 398 ug/dL (ref 250–450)
UIBC: 270 ug/dL

## 2021-11-09 LAB — FERRITIN: Ferritin: 40 ng/mL (ref 11–307)

## 2021-11-11 LAB — INTRINSIC FACTOR ANTIBODIES: Intrinsic Factor: 1.2 AU/mL — ABNORMAL HIGH (ref 0.0–1.1)

## 2021-11-12 ENCOUNTER — Other Ambulatory Visit: Payer: Self-pay

## 2021-11-12 ENCOUNTER — Ambulatory Visit: Payer: Medicaid Other | Admitting: Physical Therapy

## 2021-11-12 ENCOUNTER — Encounter: Payer: Self-pay | Admitting: Physical Therapy

## 2021-11-12 DIAGNOSIS — R262 Difficulty in walking, not elsewhere classified: Secondary | ICD-10-CM | POA: Diagnosis not present

## 2021-11-12 DIAGNOSIS — M6281 Muscle weakness (generalized): Secondary | ICD-10-CM

## 2021-11-12 LAB — ANTI-PARIETAL ANTIBODY: Parietal Cell Antibody-IgG: 61.5 Units — ABNORMAL HIGH (ref 0.0–20.0)

## 2021-11-12 NOTE — Therapy (Signed)
[facility] [address] [place], [place], [address] Phone: [phone]   Fax:  [phone]  Physical Therapy Treatment  Patient Details  Name: Nicole Hartman MRN: [id number] Date of Birth: Apr 27, 1996 Referring Provider (PT): [place], [place]   Encounter Date: 11/12/2021   PT End of Session - 11/12/21 1204     Visit Number 8    Number of Visits 12    Date for PT Re-Evaluation 11/15/21    PT Start Time 1200    PT Stop Time 1240    PT Time Calculation (min) 40 min    Activity Tolerance Patient tolerated treatment well    Behavior During Therapy [facility] for tasks assessed/performed             Past Medical History:  Diagnosis Date   Anemia    Asthma    exercise induced   Cesarean delivery delivered 03/02/2016   Depression 2014   GERD (gastroesophageal reflux disease)    Headache    h/o migraines   PTSD (post-traumatic stress disorder) 2014   Thrombocytopenia (HCC)     Past Surgical History:  Procedure Laterality Date   CESAREAN SECTION N/A 03/01/2016   Procedure: CESAREAN SECTION;  Surgeon: Conard Novak, MD;  Location: [facility];  Service: Obstetrics;  Laterality: N/A;   CESAREAN SECTION N/A 02/11/2020   Procedure: CESAREAN SECTION;  Surgeon: Conard Novak, MD;  Location: [facility];  Service: Obstetrics;  Laterality: N/A;  RNFA   CESAREAN SECTION WITH BILATERAL TUBAL LIGATION N/A 09/14/2021   Procedure: CESAREAN SECTION WITH BILATERAL TUBAL LIGATION;  Surgeon: Conard Novak, MD;  Location: [facility];  Service: Obstetrics;  Laterality: N/A;    There were no vitals filed for this visit.   Subjective Assessment - 11/12/21 1202     Subjective Patient states that she had a misstep when exiting the car and had loss of balance but was able to recover. However she does have some increased discomfort in RLE as a result.    Currently in Pain? Yes    Pain Score 5     Pain Location Leg    Pain Orientation  Right            TREATMENT Neuromuscular Re-education: Supine SAQ sets with NMES for improved motor recruitment at R quadriceps. (50pps, 10 sec on/50 sec off, x10 min, 20.0 mVa) Modified dead bug, with PT assist at RLE for improved motor control Supine proximal sciatic nerve glide with TrA activation Supine distal sciatic nerve glide with TrA activation Supine combined sciatic nerve glide with TrA activation Sidelying RLE hip adduction against gravity for improved VMO activation  Patient educated throughout session on appropriate technique and form using multi-modal cueing, HEP, and activity modification. Patient articulated understanding and returned demonstration.  Patient Response to interventions: 5/10 pain  ASSESSMENT Patient presents to clinic with excellent motivation to participate in therapy. Patient demonstrates deficits in RLE strength, gait, pain, and function. Patient with muscular fasciculations and lack of knee extension control on standing after interventions during today's session, but was able to ambulate out of clinic with RW and responded positively to all active interventions. Patient will benefit from continued skilled therapeutic intervention to address remaining deficits in RLE strength, gait, pain, and function in order to increase function and improve overall QOL.   PT Long Term Goals - 10/04/21 1509       PT LONG TERM GOAL #1   Title Patient will be independent with HEP  in order to improve strength and balance in order to decrease fall risk and improve function at home and in the community.    Baseline IE: not initiated    Time 6    Period Weeks    Status New    Target Date 11/15/21      PT LONG TERM GOAL #2   Title Patient will decrease 5TSTS to < 12 seconds without UE support nor compensatory patterns in order to demonstrate clinically significant improvement in LE strength and to approach peer normative values.    Baseline IE: 63.4 sec (LLE bias)     Time 6    Period Weeks    Status New    Target Date 11/15/21      PT LONG TERM GOAL #3   Title Patient will decrease TUG to below 14 seconds/decrease in order to demonstrate decreased fall risk.    Baseline IE: assess at next visit    Time 6    Period Weeks    Status New    Target Date 11/15/21      PT LONG TERM GOAL #4   Title Patient will increase by at least 45m (162ft) in order to demonstrate clinically significant improvement in cardiopulmonary endurance and community ambulation    Baseline IE: assess at next visit    Time 6    Period Weeks    Status New    Target Date 11/15/21      PT LONG TERM GOAL #5   Title Patient will demonstrate improved function as evidenced by a score of 68 on FOTO measure for full participation in activities at home and in the community.    Baseline IE: 50    Time 6    Period Weeks    Status New    Target Date 11/15/21                   Plan - 11/12/21 1204     Clinical Impression Statement Patient presents to clinic with excellent motivation to participate in therapy. Patient demonstrates deficits in RLE strength, gait, pain, and function. Patient with muscular fasciculations and lack of knee extension control on standing after interventions during today's session, but was able to ambulate out of clinic with RW and responded positively to all active interventions. Patient will benefit from continued skilled therapeutic intervention to address remaining deficits in RLE strength, gait, pain, and function in order to increase function and improve overall QOL.    Personal Factors and Comorbidities Comorbidity 3+;Time since onset of injury/illness/exacerbation    Comorbidities anemia, asthma, depression, GERD, migraines, PTSD    Examination-Activity Limitations Bathing;Hygiene/Grooming;Toileting;Squat;Sleep;Transfers;Dressing;Stairs;Bend;Lift    Examination-Participation Restrictions Community Activity;Occupation;Driving;Meal  Prep;Cleaning;Shop;Laundry    Stability/Clinical Decision Making Evolving/Moderate complexity    Rehab Potential Good    PT Frequency 2x / week    PT Duration 6 weeks    PT Treatment/Interventions ADLs/Self Care Home Management;Cryotherapy;Electrical Stimulation;Moist Heat;Neuromuscular re-education;Therapeutic exercise;Stair training;Gait training;Patient/family education;Orthotic Fit/Training;Manual techniques;Scar mobilization;Taping;Joint Manipulations;Spinal Manipulations    PT Home Exercise Plan [id number]; [id number]    Consulted and Agree with Plan of Care Patient             Patient will benefit from skilled therapeutic intervention in order to improve the following deficits and impairments:  Abnormal gait, Decreased balance, Decreased endurance, Decreased mobility, Difficulty walking, Pain, Decreased strength, Decreased coordination, Decreased activity tolerance, Decreased scar mobility  Visit Diagnosis: Difficulty in walking, not elsewhere classified  Muscle weakness (generalized)  Problem List Patient Active Problem List   Diagnosis Date Noted   History of cesarean delivery 09/14/2021   Encounter for sterilization 09/14/2021   [redacted] weeks gestation of pregnancy 09/14/2021   Back pain affecting pregnancy in third trimester 08/27/2021   Anemia    B12 deficiency 07/13/2021   Absolute anemia 07/13/2021   Thrombocytopenia affecting pregnancy, antepartum (HCC) 07/10/2021   Depressed mood 06/14/2021   Pelvic pain in pregnancy 06/14/2021   History of cesarean delivery affecting pregnancy 06/14/2021   Prenatal care, subsequent pregnancy, second trimester 02/23/2021   Nausea/vomiting in pregnancy 02/23/2021   Supervision of high risk pregnancy, antepartum 07/02/2019   PTSD (post-traumatic stress disorder) 01/08/2013   MDD (major depressive disorder), recurrent episode, severe (HCC) 01/08/2013    Sheria LangKatlin Avrom Robarts PT, DPT [phone]  11/12/2021, 1:01 PM  [place] [address] [place], [place], [address] Phone: [phone]   Fax:  [phone]  Name: Nicole Hartman MRN: [id number] Date of Birth: [birth year]

## 2021-11-13 ENCOUNTER — Encounter: Payer: Self-pay | Admitting: Oncology

## 2021-11-13 ENCOUNTER — Inpatient Hospital Stay (HOSPITAL_BASED_OUTPATIENT_CLINIC_OR_DEPARTMENT_OTHER): Payer: Medicaid Other | Admitting: Oncology

## 2021-11-13 ENCOUNTER — Inpatient Hospital Stay: Payer: Medicaid Other

## 2021-11-13 VITALS — BP 102/69 | HR 94 | Temp 96.7°F | Wt 145.0 lb

## 2021-11-13 DIAGNOSIS — D508 Other iron deficiency anemias: Secondary | ICD-10-CM | POA: Diagnosis not present

## 2021-11-13 DIAGNOSIS — E538 Deficiency of other specified B group vitamins: Secondary | ICD-10-CM

## 2021-11-13 DIAGNOSIS — D510 Vitamin B12 deficiency anemia due to intrinsic factor deficiency: Secondary | ICD-10-CM | POA: Insufficient documentation

## 2021-11-13 LAB — VITAMIN B12: Vitamin B-12: 549 pg/mL (ref 180–914)

## 2021-11-13 NOTE — Progress Notes (Signed)
Hematology/Oncology follow up note Telephone:([phone]) [phone] Fax:([phone]   Patient Care Team: Center, [facility] as PCP - General  REFERRING PROVIDER: Center, [facility]  CHIEF COMPLAINTS/REASON FOR VISIT:  Follow up for anemia and thrombocytopenia  HISTORY OF PRESENTING ILLNESS:  Nicole Hartman is a 26 y.o. female who was seen in consultation at the request of [facility]* for evaluation of thrombocytopenia, and anemia.   Reviewed patient's labs done previously.  07/05/2021 Labs showed decreased platelet counts at 86,000 wbc is normal  hemoglobin 10.1 Reviewed patient's previous labs. Thrombocytopenia is new onset.  Anemia is chronic, since 2017.   Associated symptoms or signs:  Denies weight loss, fever, chills, fatigue, night sweats.  Denies hematochezia, hematuria, hematemesis, epistaxis, black tarry stool or easy bruising.  Patient reports that she has been on iron tablets, most recently 4 time per week.  She is currently pregnant, in third trimester.   INTERVAL HISTORY Nicole Hartman is a 26 y.o. female who has above history reviewed by me today presents for follow up visit for management of iron deficiency anemia in pregnancy and vitamin B12 deficiency Problems and complaints are listed below: She had blood work done since last visit and she presents to discuss results.  She has started on B12 injections.  Still feel fatigued.   Review of Systems  Constitutional:  Positive for fatigue. Negative for appetite change, chills and fever.  HENT:   Negative for hearing loss and voice change.   Eyes:  Negative for eye problems.  Respiratory:  Negative for chest tightness and cough.   Cardiovascular:  Negative for chest pain.  Gastrointestinal:  Negative for abdominal distention, abdominal pain and blood in stool.  Endocrine: Negative for hot flashes.  Genitourinary:  Negative for difficulty urinating and frequency.    Musculoskeletal:  Negative for arthralgias.  Skin:  Negative for itching and rash.  Neurological:  Negative for extremity weakness.  Hematological:  Negative for adenopathy.  Psychiatric/Behavioral:  Negative for confusion.    MEDICAL HISTORY:  Past Medical History:  Diagnosis Date   Anemia    Asthma    exercise induced   Cesarean delivery delivered 03/02/2016   Depression 2014   GERD (gastroesophageal reflux disease)    Headache    h/o migraines   PTSD (post-traumatic stress disorder) 2014   Thrombocytopenia ([place])     SURGICAL HISTORY: Past Surgical History:  Procedure Laterality Date   CESAREAN SECTION N/A 03/01/2016   Procedure: CESAREAN SECTION;  Surgeon: Will Bonnet, MD;  Location: [facility] ORS;  Service: Obstetrics;  Laterality: N/A;   CESAREAN SECTION N/A 02/11/2020   Procedure: CESAREAN SECTION;  Surgeon: Will Bonnet, MD;  Location: [facility] ORS;  Service: Obstetrics;  Laterality: N/A;  RNFA   CESAREAN SECTION WITH BILATERAL TUBAL LIGATION N/A 09/14/2021   Procedure: CESAREAN SECTION WITH BILATERAL TUBAL LIGATION;  Surgeon: Will Bonnet, MD;  Location: [facility] ORS;  Service: Obstetrics;  Laterality: N/A;    SOCIAL HISTORY: Social History   Socioeconomic History   Marital status: Married    Spouse name: Cindee Salt   Number of children: 3   Years of education: college   Highest education level: Not on file  Occupational History   Occupation: Art therapist  Tobacco Use   Smoking status: Never   Smokeless tobacco: Never  Vaping Use   Vaping Use: Never used  Substance and Sexual Activity   Alcohol use: No   Drug use: No   Sexual activity: Yes  Partners: Male    Birth control/protection: Surgical  Other Topics Concern   Not on file  Social History Narrative   Lives at home with husband and three children.   Right-handed.   One cup caffeine per day.   Social Determinants of Health   Financial Resource Strain: Not on file  Food Insecurity: Not on  file  Transportation Needs: Not on file  Physical Activity: Not on file  Stress: Not on file  Social Connections: Not on file  Intimate Partner Violence: Not on file    FAMILY HISTORY: Family History  Problem Relation Age of Onset   Healthy Mother    Healthy Father    Cancer - Colon Maternal Grandfather    Breast cancer Paternal Grandmother        59s   Breast cancer Maternal Aunt        not sure of age    ALLERGIES:  has No Known Allergies.  MEDICATIONS:  Current Outpatient Medications  Medication Sig Dispense Refill   albuterol (VENTOLIN HFA) 108 (90 Base) MCG/ACT inhaler Inhale 2 puffs into the lungs 3 (three) times daily as needed for wheezing (for cough).     calcium carbonate (TUMS - DOSED IN MG ELEMENTAL CALCIUM) 500 MG chewable tablet Chew 2 tablets (400 mg of elemental calcium total) by mouth daily as needed for indigestion or heartburn.     Ferrous Sulfate (IRON PO) Take 1 tablet by mouth daily.     ibuprofen (ADVIL) 600 MG tablet Take 1 tablet (600 mg total) by mouth every 6 (six) hours. 30 tablet 0   Prenatal Vit-Fe Fumarate-FA (PNV PRENATAL PLUS MULTIVITAMIN) 27-1 MG TABS Take 1 tablet by mouth daily.      No current facility-administered medications for this visit.     PHYSICAL EXAMINATION:  There were no vitals filed for this visit.  There were no vitals filed for this visit.   Physical Exam Constitutional:      General: She is not in acute distress. HENT:     Head: Normocephalic and atraumatic.  Eyes:     General: No scleral icterus. Cardiovascular:     Rate and Rhythm: Normal rate and regular rhythm.     Heart sounds: Normal heart sounds.  Pulmonary:     Effort: Pulmonary effort is normal. No respiratory distress.     Breath sounds: No wheezing.  Abdominal:     General: Bowel sounds are normal. There is no distension.     Palpations: Abdomen is soft.  Musculoskeletal:        General: No deformity. Normal range of motion.     Cervical back:  Normal range of motion and neck supple.  Skin:    General: Skin is warm and dry.     Findings: No erythema or rash.  Neurological:     Mental Status: She is alert and oriented to person, place, and time. Mental status is at baseline.     Cranial Nerves: No cranial nerve deficit.     Coordination: Coordination normal.  Psychiatric:        Mood and Affect: Mood normal.     LABORATORY DATA:  I have reviewed the data as listed Lab Results  Component Value Date   WBC 5.2 11/09/2021   HGB 12.7 11/09/2021   HCT 38.6 11/09/2021   MCV 90.0 11/09/2021   PLT 155 11/09/2021   No results for input(s): NA, K, CL, CO2, GLUCOSE, BUN, CREATININE, CALCIUM, GFRNONAA, GFRAA, PROT, ALBUMIN, AST, ALT, ALKPHOS,  BILITOT, BILIDIR, IBILI in the last 8760 hours. Iron/TIBC/Ferritin/ %Sat    Component Value Date/Time   IRON 128 11/09/2021 1120   TIBC 398 11/09/2021 1120   FERRITIN 40 11/09/2021 1120   IRONPCTSAT 32 (H) 11/09/2021 1120      RADIOGRAPHIC STUDIES: I have personally reviewed the radiological images as listed and agreed with the findings in the report.  NCV with EMG(electromyography)  Result Date: 10/18/2021 Penni Bombard, MD     10/18/2021  4:54 PM  [facility] NCS (NERVE CONDUCTION STUDY) WITH EMG (ELECTROMYOGRAPHY) REPORT STUDY DATE: 10/17/21 PATIENT NAME: Nicole Hartman DOB: 06-14-1996 MRN: [id number] ORDERING CLINICIAN: Genia Harold, MD TECHNOLOGIST: Sherre Scarlet ELECTROMYOGRAPHER: Earlean Polka. Penumalli, MD CLINICAL INFORMATION: 26 year old female with post-partum right leg pain, numbness and weakness. FINDINGS: NERVE CONDUCTION STUDY: Bilateral tibial and peroneal motor responses are normal. Bilateral tibial F wave latency is normal. Bilateral sural and superficial peroneal sensory responses are normal. NEEDLE ELECTROMYOGRAPHY: Needle examination of right lower extremity is normal. IMPRESSION: Normal study.  No electrodiagnostic evidence of large fiber neuropathy at this  time. INTERPRETING PHYSICIAN: Penni Bombard, MD Certified in Neurology, Neurophysiology and Neuroimaging [facility] [address], [place], [place] [address] [phone] [facility]   Nerve / Sites Muscle Latency Ref. Amplitude Ref. Rel Amp Segments Distance Velocity Ref. Area   ms ms mV mV %  cm m/s m/s mVms R Peroneal - EDB    Ankle EDB 3.5 ?6.5 6.1 ?2.0 100 Ankle - EDB 9   20.9    Fib head EDB 8.5  5.6  92.3 Fib head - Ankle 26 52 ?44 19.7    Pop fossa EDB 10.5  5.6  100 Pop fossa - Fib head 10 49 ?44 20.1        Pop fossa - Ankle     L Peroneal - EDB    Ankle EDB 3.6 ?6.5 5.4 ?2.0 100 Ankle - EDB 9   23.0    Fib head EDB 8.3  5.1  94.5 Fib head - Ankle 27 58 ?44 20.2    Pop fossa EDB 10.0  4.1  81 Pop fossa - Fib head 10 58 ?44 18.2        Pop fossa - Ankle     R Tibial - AH    Ankle AH 3.4 ?5.8 18.2 ?4.0 100 Ankle - AH 9   44.5    Pop fossa AH 10.1  16.2  89 Pop fossa - Ankle 36 53 ?41 43.4 L Tibial - AH    Ankle AH 2.8 ?5.8 14.1 ?4.0 100 Ankle - AH 9   34.6    Pop fossa AH 9.9  12.5  89.2 Pop fossa - Ankle 36 51 ?41 33.3           SNC   Nerve / Sites Rec. Site Peak Lat Ref.  Amp Ref. Segments Distance   ms ms V V  cm R Sural - Ankle (Calf)    Calf Ankle 3.0 ?4.4 29 ?6 Calf - Ankle 14 L Sural - Ankle (Calf)    Calf Ankle 2.7 ?4.4 31 ?6 Calf - Ankle 14 R Superficial peroneal - Ankle    Lat leg Ankle 3.3 ?4.4 12 ?6 Lat leg - Ankle 14 L Superficial peroneal - Ankle    Lat leg Ankle 3.2 ?4.4 22 ?6 Lat leg - Ankle 14           F  Wave   Nerve  F Lat Ref.  ms ms R Tibial - AH 41.4 ?56.0 L Tibial - AH 40.4 ?56.0       EMG Summary Table   Spontaneous MUAP Recruitment Muscle IA Fib PSW Fasc Other Amp Dur. Poly Pattern R. Vastus medialis Normal None None None _______ Normal Normal Normal Normal R. Tibialis anterior Normal None None None _______ Normal Normal Normal Normal R. Gastrocnemius (Medial head) Normal None None None _______ Normal Normal Normal Normal      ASSESSMENT & PLAN:  1.  Other iron deficiency anemia   2. B12 deficiency    Iron deficiency anemia during pregnancy.  Labs are reviewed and discussed with patient. Ferritin 4, iron saturation 5%, Hb 9.8 Plan IV iron with Venofer 200mg  weekly x 4 doses. Allergy reactions/infusion reaction including anaphylactic reaction discussed with patient. Other side effects include but not limited to high blood pressure, skin rash, weight gain, leg swelling, etc. Patient voices understanding and willing to proceed.  # Vitamin B12 deficiency Recommend daily B12 injection x 5 followed by weekly B12 injection x 4.  Check intrinsic factor and anti parietal antibody.   # Thrombocytopenia has resolved.    No orders of the defined types were placed in this encounter.   All questions were answered. The patient knows to call the clinic with any problems questions or concerns.  [place]  Follow up in 6 weeks. Lab MD +/- Venofer +/- B12  Earlie Server, MD, PhD 11/13/2021

## 2021-11-13 NOTE — Progress Notes (Signed)
Patient here for follow up. Denies any concerns.  

## 2021-11-13 NOTE — Progress Notes (Signed)
Hematology/Oncology Progress note Telephone:([phone] Fax:(336) [phone]      Patient Care Team: Center, [facility] as PCP - General  REFERRING PROVIDER: [facility], [facility]  CHIEF COMPLAINTS/REASON FOR VISIT:  Follow up for anemia and thrombocytopenia  HISTORY OF PRESENTING ILLNESS:  Nicole Hartman is a 26 y.o. female who was seen in consultation at the request of Center, [employer]* for evaluation of thrombocytopenia, and anemia.   Reviewed patient's labs done previously.  07/05/2021 Labs showed decreased platelet counts at 86,000 wbc is normal  hemoglobin 10.1 Reviewed patient's previous labs. Thrombocytopenia is new onset.  Anemia is chronic, since 2017.   Associated symptoms or signs:  Denies weight loss, fever, chills, fatigue, night sweats.  Denies hematochezia, hematuria, hematemesis, epistaxis, black tarry stool or easy bruising.  Patient reports that she has been on iron tablets, most recently 4 time per week.  She is currently pregnant, in third trimester.   INTERVAL HISTORY Nicole Hartman is a 26 y.o. female who has above history reviewed by me today presents for follow up visit for management of iron deficiency anemia in pregnancy and vitamin B12 deficiency Patient underwent a C-section in December 2022.  Post partum, patient developed a right lower extremity weakness.  Now she walks with a walker.  She follows up with neurology and physical therapist. Patient takes prenatal vitamin. Review of Systems  Constitutional:  Negative for appetite change, chills, fatigue and fever.  HENT:   Negative for hearing loss and voice change.   Eyes:  Negative for eye problems.  Respiratory:  Negative for chest tightness and cough.   Cardiovascular:  Negative for chest pain.  Gastrointestinal:  Negative for abdominal distention, abdominal pain and blood in stool.  Endocrine: Negative for hot flashes.  Genitourinary:  Negative for  difficulty urinating and frequency.   Musculoskeletal:  Negative for arthralgias.  Skin:  Negative for itching and rash.  Neurological:  Negative for extremity weakness.  Hematological:  Negative for adenopathy.  Psychiatric/Behavioral:  Negative for confusion.    MEDICAL HISTORY:  Past Medical History:  Diagnosis Date   Anemia    Asthma    exercise induced   Cesarean delivery delivered 03/02/2016   Depression 2014   GERD (gastroesophageal reflux disease)    Headache    h/o migraines   PTSD (post-traumatic stress disorder) 2014   Thrombocytopenia ([facility])     SURGICAL HISTORY: Past Surgical History:  Procedure Laterality Date   CESAREAN SECTION N/A 03/01/2016   Procedure: CESAREAN SECTION;  Surgeon: Conard Novak, MD;  Location: [facility] ORS;  Service: Obstetrics;  Laterality: N/A;   CESAREAN SECTION N/A 02/11/2020   Procedure: CESAREAN SECTION;  Surgeon: Conard Novak, MD;  Location: [facility] ORS;  Service: Obstetrics;  Laterality: N/A;  RNFA   CESAREAN SECTION WITH BILATERAL TUBAL LIGATION N/A 09/14/2021   Procedure: CESAREAN SECTION WITH BILATERAL TUBAL LIGATION;  Surgeon: Conard Novak, MD;  Location: [facility] ORS;  Service: Obstetrics;  Laterality: N/A;    SOCIAL HISTORY: Social History   Socioeconomic History   Marital status: Married    Spouse name: Rolm Gala   Number of children: 3   Years of education: college   Highest education level: Not on file  Occupational History   Occupation: Sales executive  Tobacco Use   Smoking status: Never   Smokeless tobacco: Never  Vaping Use   Vaping Use: Never used  Substance and Sexual Activity   Alcohol use: No   Drug use: No  Sexual activity: Yes    Partners: Male    Birth control/protection: Surgical  Other Topics Concern   Not on file  Social History Narrative   Lives at home with husband and three children.   Right-handed.   One cup caffeine per day.   Social Determinants of Health   Financial Resource Strain:  Not on file  Food Insecurity: Not on file  Transportation Needs: Not on file  Physical Activity: Not on file  Stress: Not on file  Social Connections: Not on file  Intimate Partner Violence: Not on file    FAMILY HISTORY: Family History  Problem Relation Age of Onset   Healthy Mother    Healthy Father    Cancer - Colon Maternal Grandfather    Breast cancer Paternal Grandmother        83s   Breast cancer Maternal Aunt        not sure of age    ALLERGIES:  has No Known Allergies.  MEDICATIONS:  Current Outpatient Medications  Medication Sig Dispense Refill   albuterol (VENTOLIN HFA) 108 (90 Base) MCG/ACT inhaler Inhale 2 puffs into the lungs 3 (three) times daily as needed for wheezing (for cough).     calcium carbonate (TUMS - DOSED IN MG ELEMENTAL CALCIUM) 500 MG chewable tablet Chew 2 tablets (400 mg of elemental calcium total) by mouth daily as needed for indigestion or heartburn.     Ferrous Sulfate (IRON PO) Take 1 tablet by mouth daily.     ibuprofen (ADVIL) 600 MG tablet Take 1 tablet (600 mg total) by mouth every 6 (six) hours. 30 tablet 0   Prenatal Vit-Fe Fumarate-FA (PNV PRENATAL PLUS MULTIVITAMIN) 27-1 MG TABS Take 1 tablet by mouth daily.      No current facility-administered medications for this visit.     PHYSICAL EXAMINATION:  Vitals:   11/13/21 1319  BP: 102/69  Pulse: 94  Temp: (!) 96.7 F (35.9 C)   Filed Weights   11/13/21 1319  Weight: 145 lb (65.8 kg)    Physical Exam Constitutional:      General: She is not in acute distress.    Comments: Patient ambulates with a walker  HENT:     Head: Normocephalic and atraumatic.  Eyes:     General: No scleral icterus. Cardiovascular:     Rate and Rhythm: Normal rate and regular rhythm.     Heart sounds: Normal heart sounds.  Pulmonary:     Effort: Pulmonary effort is normal. No respiratory distress.     Breath sounds: No wheezing.  Abdominal:     General: Bowel sounds are normal. There is no  distension.     Palpations: Abdomen is soft.  Musculoskeletal:        General: No deformity. Normal range of motion.     Cervical back: Normal range of motion and neck supple.  Skin:    General: Skin is warm and dry.     Findings: No erythema or rash.  Neurological:     Mental Status: She is alert and oriented to person, place, and time. Mental status is at baseline.     Cranial Nerves: No cranial nerve deficit.     Comments: Right lower extremity weakness, 3/5  Psychiatric:        Mood and Affect: Mood normal.     LABORATORY DATA:  I have reviewed the data as listed Lab Results  Component Value Date   WBC 5.2 11/09/2021   HGB 12.7 11/09/2021  HCT 38.6 11/09/2021   MCV 90.0 11/09/2021   PLT 155 11/09/2021   No results for input(s): NA, K, CL, CO2, GLUCOSE, BUN, CREATININE, CALCIUM, GFRNONAA, GFRAA, PROT, ALBUMIN, AST, ALT, ALKPHOS, BILITOT, BILIDIR, IBILI in the last 8760 hours. Iron/TIBC/Ferritin/ %Sat    Component Value Date/Time   IRON 128 11/09/2021 1120   TIBC 398 11/09/2021 1120   FERRITIN 40 11/09/2021 1120   IRONPCTSAT 32 (H) 11/09/2021 1120      RADIOGRAPHIC STUDIES: I have personally reviewed the radiological images as listed and agreed with the findings in the report.  NCV with EMG(electromyography)  Result Date: 10/18/2021 Penni Bombard, MD     10/18/2021  4:54 PM  [facility] NCS (NERVE CONDUCTION STUDY) WITH EMG (ELECTROMYOGRAPHY) REPORT STUDY DATE: 10/17/21 PATIENT NAME: MARIJOSE LEMELLE DOB: 10/01/1996 MRN: [id number] ORDERING CLINICIAN: Genia Harold, MD TECHNOLOGIST: Sherre Scarlet ELECTROMYOGRAPHER: Earlean Polka. Penumalli, MD CLINICAL INFORMATION: 26 year old female with post-partum right leg pain, numbness and weakness. FINDINGS: NERVE CONDUCTION STUDY: Bilateral tibial and peroneal motor responses are normal. Bilateral tibial F wave latency is normal. Bilateral sural and superficial peroneal sensory responses are normal. NEEDLE  ELECTROMYOGRAPHY: Needle examination of right lower extremity is normal. IMPRESSION: Normal study.  No electrodiagnostic evidence of large fiber neuropathy at this time. INTERPRETING PHYSICIAN: Penni Bombard, MD Certified in Neurology, Neurophysiology and Neuroimaging [facility] [address] [place], [place] [address] [phone] [facility]   Nerve / Sites Muscle Latency Ref. Amplitude Ref. Rel Amp Segments Distance Velocity Ref. Area   ms ms mV mV %  cm m/s m/s mVms R Peroneal - EDB    Ankle EDB 3.5 ?6.5 6.1 ?2.0 100 Ankle - EDB 9   20.9    Fib head EDB 8.5  5.6  92.3 Fib head - Ankle 26 52 ?44 19.7    Pop fossa EDB 10.5  5.6  100 Pop fossa - Fib head 10 49 ?44 20.1        Pop fossa - Ankle     L Peroneal - EDB    Ankle EDB 3.6 ?6.5 5.4 ?2.0 100 Ankle - EDB 9   23.0    Fib head EDB 8.3  5.1  94.5 Fib head - Ankle 27 58 ?44 20.2    Pop fossa EDB 10.0  4.1  81 Pop fossa - Fib head 10 58 ?44 18.2        Pop fossa - Ankle     R Tibial - AH    Ankle AH 3.4 ?5.8 18.2 ?4.0 100 Ankle - AH 9   44.5    Pop fossa AH 10.1  16.2  89 Pop fossa - Ankle 36 53 ?41 43.4 L Tibial - AH    Ankle AH 2.8 ?5.8 14.1 ?4.0 100 Ankle - AH 9   34.6    Pop fossa AH 9.9  12.5  89.2 Pop fossa - Ankle 36 51 ?41 33.3           SNC   Nerve / Sites Rec. Site Peak Lat Ref.  Amp Ref. Segments Distance   ms ms V V  cm R Sural - Ankle (Calf)    Calf Ankle 3.0 ?4.4 29 ?6 Calf - Ankle 14 L Sural - Ankle (Calf)    Calf Ankle 2.7 ?4.4 31 ?6 Calf - Ankle 14 R Superficial peroneal - Ankle    Lat leg Ankle 3.3 ?4.4 12 ?6 Lat leg - Ankle 14 L Superficial  peroneal - Ankle    Lat leg Ankle 3.2 ?4.4 22 ?6 Lat leg - Ankle 14           F  Wave   Nerve F Lat Ref.  ms ms R Tibial - AH 41.4 ?56.0 L Tibial - AH 40.4 ?56.0       EMG Summary Table   Spontaneous MUAP Recruitment Muscle IA Fib PSW Fasc Other Amp Dur. Poly Pattern R. Vastus medialis Normal None None None _______ Normal Normal Normal Normal R. Tibialis anterior Normal None None  None _______ Normal Normal Normal Normal R. Gastrocnemius (Medial head) Normal None None None _______ Normal Normal Normal Normal      ASSESSMENT & PLAN:  1. Other iron deficiency anemia   2. B12 deficiency   3. Pernicious anemia    Iron deficiency anemia  Status post Venofer treatment previously Labs reviewed and discussed with patient Hemoglobin and iron panel have both improved.  No need for additional IV Venofer treatment at this point.  # Vitamin B12 deficiency Status post parenteral vitamin B12 injections Repeat B12 level today. Patient has positive parietal cell antibody and antiintrinsic antibody.  This is consistent with pernicious anemia.  Recommend patient establish care with gastroenterology.  Will refer to Dr. Vicente Males.   Hartman Placed This Encounter  Procedures   Vitamin B12    Standing Status:   Future    Number of Occurrences:   1    Standing Expiration Date:   11/13/2022   Ambulatory referral to Gastroenterology    Referral Priority:   Routine    Referral Type:   Consultation    Referral Reason:   Specialty Services Required    Number of Visits Requested:   1    All questions were answered. The patient knows to call the clinic with any problems questions or concerns.  [place]  Follow up in 6 months. Lab MD +/- Venofer +/- B12  Earlie Server, MD, PhD 11/13/2021

## 2021-11-14 ENCOUNTER — Telehealth: Payer: Self-pay

## 2021-11-14 NOTE — Telephone Encounter (Signed)
Scheduled at 040/09/2022

## 2021-11-15 ENCOUNTER — Ambulatory Visit: Payer: Medicaid Other | Attending: Obstetrics and Gynecology | Admitting: Physical Therapy

## 2021-11-15 ENCOUNTER — Encounter: Payer: Self-pay | Admitting: Physical Therapy

## 2021-11-15 ENCOUNTER — Other Ambulatory Visit: Payer: Self-pay

## 2021-11-15 DIAGNOSIS — R262 Difficulty in walking, not elsewhere classified: Secondary | ICD-10-CM | POA: Diagnosis not present

## 2021-11-15 DIAGNOSIS — M6281 Muscle weakness (generalized): Secondary | ICD-10-CM | POA: Diagnosis present

## 2021-11-15 NOTE — Therapy (Signed)
[place] [facility] [address] [place], [place], [address] Phone: [phone]   Fax:  [phone]  Physical Therapy Treatment  Patient Details  Name: Nicole Hartman MRN: [id number] Date of Birth: 1996/05/21 Referring Provider (PT): [place], [place]   Encounter Date: 11/15/2021   PT End of Session - 11/15/21 1616     Visit Number 9    Number of Visits 12    Date for PT Re-Evaluation 11/15/21    PT Start Time 1335    PT Stop Time 1415    PT Time Calculation (min) 40 min    Activity Tolerance Patient tolerated treatment well    Behavior During Therapy [facility] for tasks assessed/performed             Past Medical History:  Diagnosis Date   Anemia    Asthma    exercise induced   Cesarean delivery delivered 03/02/2016   Depression 2014   GERD (gastroesophageal reflux disease)    Headache    h/o migraines   PTSD (post-traumatic stress disorder) 2014   Thrombocytopenia (HCC)     Past Surgical History:  Procedure Laterality Date   CESAREAN SECTION N/A 03/01/2016   Procedure: CESAREAN SECTION;  Surgeon: Conard Novak, MD;  Location: [facility] ORS;  Service: Obstetrics;  Laterality: N/A;   CESAREAN SECTION N/A 02/11/2020   Procedure: CESAREAN SECTION;  Surgeon: Conard Novak, MD;  Location: [facility] OR[facility];  Service: Obstetrics;  Laterality: N/A;  RNFA   CESAREAN SECTION WITH BILATERAL TUBAL LIGATION N/A 09/14/2021   Procedure: CESAREAN SECTION WITH BILATERAL TUBAL LIGATION;  Surgeon: Conard Novak, MD;  Location: [facility] ORS;  Service: Obstetrics;  Laterality: N/A;    There were no vitals filed for this visit.   Subjective Assessment - 11/15/21 1615     Subjective Patient reports that she had increased pain and soreness with associated muscle fatigue after NMES use at last session. The discomfort lasted for between 36 and 48 hours. Patient denies any other significant changes.    Currently in Pain? Yes    Pain Score 5               TREATMENT Therapeutic Exercise: Sci-fit, seat 2, L0, for warm-up, during history taking, x5 min Total gym squats with VMO activation, with Glute med activation, at roughly 45 degree incline Total gym sumo squats, TCs for improved motor control Supine SAQ sets, PT assisted concentric, faded with PT resisted eccentric phase Supine quad sets, RLE, 5 sec holds Supine heel slides, RLE Supine hip adduction/abduction slides, RLE  Patient educated throughout session on appropriate technique and form using multi-modal cueing, HEP, and activity modification. Patient articulated understanding and returned demonstration.  Patient Response to interventions: 5/10 pain; decreased motor control on STS at end of session.  ASSESSMENT Patient presents to clinic with excellent motivation to participate in therapy. Patient demonstrates deficits in RLE strength, gait, pain, and function. Patient with good effort for all strengthening interventions during today's session and continues to have limited R hip flexion and knee extension power and endurance. Patient will benefit from continued skilled therapeutic intervention to address remaining deficits in RLE strength, gait, pain, and function in order to increase function and improve overall QOL.    PT Long Term Goals - 10/04/21 1509       PT LONG TERM GOAL #1   Title Patient will be independent with HEP in order to improve strength and balance in order to decrease fall risk and  improve function at home and in the community.    Baseline IE: not initiated    Time 6    Period Weeks    Status New    Target Date 11/15/21      PT LONG TERM GOAL #2   Title Patient will decrease 5TSTS to < 12 seconds without UE support nor compensatory patterns in order to demonstrate clinically significant improvement in LE strength and to approach peer normative values.    Baseline IE: 63.4 sec (LLE bias)    Time 6    Period Weeks    Status New    Target Date  11/15/21      PT LONG TERM GOAL #3   Title Patient will decrease TUG to below 14 seconds/decrease in order to demonstrate decreased fall risk.    Baseline IE: assess at next visit    Time 6    Period Weeks    Status New    Target Date 11/15/21      PT LONG TERM GOAL #4   Title Patient will increase by at least 72m (122ft) in order to demonstrate clinically significant improvement in cardiopulmonary endurance and community ambulation    Baseline IE: assess at next visit    Time 6    Period Weeks    Status New    Target Date 11/15/21      PT LONG TERM GOAL #5   Title Patient will demonstrate improved function as evidenced by a score of 68 on FOTO measure for full participation in activities at home and in the community.    Baseline IE: 50    Time 6    Period Weeks    Status New    Target Date 11/15/21                   Plan - 11/15/21 1617     Clinical Impression Statement Patient presents to clinic with excellent motivation to participate in therapy. Patient demonstrates deficits in RLE strength, gait, pain, and function. Patient with good effort for all strengthening interventions during today's session and continues to have limited R hip flexion and knee extension power and endurance. Patient will benefit from continued skilled therapeutic intervention to address remaining deficits in RLE strength, gait, pain, and function in order to increase function and improve overall QOL.    Personal Factors and Comorbidities Comorbidity 3+;Time since onset of injury/illness/exacerbation    Comorbidities anemia, asthma, depression, GERD, migraines, PTSD    Examination-Activity Limitations Bathing;Hygiene/Grooming;Toileting;Squat;Sleep;Transfers;Dressing;Stairs;Bend;Lift    Examination-Participation Restrictions Community Activity;Occupation;Driving;Meal Prep;Cleaning;Shop;Laundry    Stability/Clinical Decision Making Evolving/Moderate complexity    Rehab Potential Good    PT  Frequency 2x / week    PT Duration 6 weeks    PT Treatment/Interventions ADLs/Self Care Home Management;Cryotherapy;Electrical Stimulation;Moist Heat;Neuromuscular re-education;Therapeutic exercise;Stair training;Gait training;Patient/family education;Orthotic Fit/Training;Manual techniques;Scar mobilization;Taping;Joint Manipulations;Spinal Manipulations    PT Next Visit Plan GOALS    PT Home Exercise Plan [id number]; [id number]    Consulted and Agree with Plan of Care Patient             Patient will benefit from skilled therapeutic intervention in order to improve the following deficits and impairments:  Abnormal gait, Decreased balance, Decreased endurance, Decreased mobility, Difficulty walking, Pain, Decreased strength, Decreased coordination, Decreased activity tolerance, Decreased scar mobility  Visit Diagnosis: Difficulty in walking, not elsewhere classified  Muscle weakness (generalized)     Problem List Patient Active Problem List   Diagnosis Date Noted   Pernicious anemia  11/13/2021   History of cesarean delivery 09/14/2021   Encounter for sterilization 09/14/2021   [redacted] weeks gestation of pregnancy 09/14/2021   Back pain affecting pregnancy in third trimester 08/27/2021   Anemia    B12 deficiency 07/13/2021   Absolute anemia 07/13/2021   Thrombocytopenia affecting pregnancy, antepartum (HCC) 07/10/2021   Depressed mood 06/14/2021   Pelvic pain in pregnancy 06/14/2021   History of cesarean delivery affecting pregnancy 06/14/2021   Prenatal care, subsequent pregnancy, second trimester 02/23/2021   Nausea/vomiting in pregnancy 02/23/2021   Supervision of high risk pregnancy, antepartum 07/02/2019   PTSD (post-traumatic stress disorder) 01/08/2013   MDD (major depressive disorder), recurrent episode, severe (HCC) 01/08/2013    Sheria LangKatlin Kathi Dohn PT, DPT [phone]  11/15/2021, 6:34 PM  [facility] [address] [place],  [place], [id number] Phone: [phone]   Fax:  [phone]  Name: Nicole Hartman MRN: [id number] Date of Birth: [birth year]

## 2021-11-20 ENCOUNTER — Encounter: Payer: Self-pay | Admitting: Physical Therapy

## 2021-11-20 ENCOUNTER — Ambulatory Visit: Payer: Medicaid Other | Admitting: Physical Therapy

## 2021-11-20 ENCOUNTER — Other Ambulatory Visit: Payer: Self-pay

## 2021-11-20 DIAGNOSIS — M6281 Muscle weakness (generalized): Secondary | ICD-10-CM

## 2021-11-20 DIAGNOSIS — R262 Difficulty in walking, not elsewhere classified: Secondary | ICD-10-CM | POA: Diagnosis not present

## 2021-11-20 NOTE — Therapy (Signed)
[facility] [address] [place], [place], [address] Phone: [phone]   Fax:  [phone]  Physical Therapy Treatment Physical Therapy Progress Note   Dates of reporting period  10/09/2021   to   11/20/2021   Patient Details  Name: Nicole Hartman MRN: [id number] Date of Birth: Nov 24, 1995 Referring Provider (PT): [place], [place]   Encounter Date: 11/20/2021   PT End of Session - 11/20/21 1741     Visit Number 10    Number of Visits 20    Date for PT Re-Evaluation 12/18/21    PT Start Time 1330    PT Stop Time [id number]    PT Time Calculation (min) 45 min    Activity Tolerance Patient tolerated treatment well;Patient limited by fatigue    Behavior During Therapy [facility] for tasks assessed/performed             Past Medical History:  Diagnosis Date   Anemia    Asthma    exercise induced   Cesarean delivery delivered 03/02/2016   Depression 2014   GERD (gastroesophageal reflux disease)    Headache    h/o migraines   PTSD (post-traumatic stress disorder) 2014   Thrombocytopenia ([place])     Past Surgical History:  Procedure Laterality Date   CESAREAN SECTION N/A 03/01/2016   Procedure: CESAREAN SECTION;  Surgeon: Will Bonnet, MD;  Location: [facility] ORS;  Service: Obstetrics;  Laterality: N/A;   CESAREAN SECTION N/A 02/11/2020   Procedure: CESAREAN SECTION;  Surgeon: Will Bonnet, MD;  Location: [facility] ORS;  Service: Obstetrics;  Laterality: N/A;  RNFA   CESAREAN SECTION WITH BILATERAL TUBAL LIGATION N/A 09/14/2021   Procedure: CESAREAN SECTION WITH BILATERAL TUBAL LIGATION;  Surgeon: Will Bonnet, MD;  Location: [facility] ORS;  Service: Obstetrics;  Laterality: N/A;    There were no vitals filed for this visit.   Subjective Assessment - 11/20/21 1747     Subjective Patient states that she had a good weekend away. She notes that she was surprised at how well she was able to walk around the city, but did have  some increased unsteadiness and weakness after a lot of direction changes and navigating varying incline/decline. Patient did not have any falls. Notes she did have some decreased quad control on rising after dinner.    Currently in Pain? Yes    Pain Score 3     Pain Location Leg    Pain Orientation Right             TREATMENT Therapeutic Exercise: Sci-fit, seat 2, L0, for warm-up, during history taking, x5 min Total gym squats, hip width, x10 with faded assistance for initiation Total gym narrow squats, x10 with faded assistance for initiation Total gym sumo squats, x10 with faded assistance for initiation, TCs for improved motor control LAQ, 3# CW, x10, PT assistance for initiation of movement SLR, x5, PT assistance for initiation of movement Supine SAQ sets with NMES for improved motor recruitment at R quadriceps. (50pps, 10 sec on/50 sec off, x6 min, 28.0 mVa)  Patient educated throughout session on appropriate technique and form using multi-modal cueing, HEP, and activity modification. Patient articulated understanding and returned demonstration.  Patient Response to interventions: 5/10 pain; decreased motor control on STS at end of session.  ASSESSMENT Patient presents to clinic with excellent motivation to participate in therapy. Patient demonstrates deficits in RLE strength, gait, pain, and function. Patient has made significant progress toward goals; most notably patient reduced  5TSTS by ~20 seconds on testing during today's session. Patient now able to perform SLR with RLE while maintaining active knee extension. Patient continues to have RLE power and endurance with paresthesias. Patient's condition has the potential to improve in response to therapy. Maximum improvement is yet to be obtained. The anticipated improvement is attainable and reasonable in a generally predictable time. Patient will benefit from continued skilled therapeutic intervention to address remaining  deficits in RLE strength, gait, pain, and function in order to increase function and improve overall QOL.   PT Long Term Goals - 11/20/21 1742       PT LONG TERM GOAL #1   Title Patient will be independent with HEP in order to improve strength and balance in order to decrease fall risk and improve function at home and in the community.    Baseline IE: not initiated; 2/7: IND    Time 6    Period Weeks    Status Achieved    Target Date 11/15/21      PT LONG TERM GOAL #2   Title Patient will decrease 5TSTS to < 12 seconds without UE support nor compensatory patterns in order to demonstrate clinically significant improvement in LE strength and to approach peer normative values.    Baseline IE: 63.4 sec (LLE bias); 2/7: intermittent UE support no bias 45.5 sec, LLE bias 20.5 sec    Time 4    Period Weeks    Status On-going    Target Date 12/18/21      PT LONG TERM GOAL #3   Title Patient will decrease TUG to below 14 seconds/decrease in order to demonstrate decreased fall risk.    Baseline IE: assess at next visit    Time 6    Period Weeks    Status Deferred    Target Date --      PT LONG TERM GOAL #4   Title Patient will increase 6MWT by at least 31m (191ft) in order to demonstrate clinically significant improvement in cardiopulmonary endurance and community ambulation    Baseline IE: assess at next visit; 12/27: 2 min walk test: 56 ft, 5/10 pain (burning), RW, SBA, 2/7: deferred    Time 4    Period Weeks    Status On-going    Target Date 12/18/21      PT LONG TERM GOAL #5   Title Patient will demonstrate improved function as evidenced by a score of 68 on FOTO measure for full participation in activities at home and in the community.    Baseline IE: 50; 1/30: 72    Time 4    Period Weeks    Status On-going    Target Date 12/18/21                   Plan - 11/20/21 1742     Clinical Impression Statement Patient presents to clinic with excellent motivation to  participate in therapy. Patient demonstrates deficits in RLE strength, gait, pain, and function. Patient has made significant progress toward goals; most notably patient reduced 5TSTS by ~20 seconds on testing during today's session. Patient now able to perform SLR with RLE while maintaining active knee extension. Patient continues to have RLE power and endurance with paresthesias. Patient's condition has the potential to improve in response to therapy. Maximum improvement is yet to be obtained. The anticipated improvement is attainable and reasonable in a generally predictable time. Patient will benefit from continued skilled therapeutic intervention to address remaining deficits in RLE  strength, gait, pain, and function in order to increase function and improve overall QOL.    Personal Factors and Comorbidities Comorbidity 3+;Time since onset of injury/illness/exacerbation    Comorbidities anemia, asthma, depression, GERD, migraines, PTSD    Examination-Activity Limitations Bathing;Hygiene/Grooming;Toileting;Squat;Sleep;Transfers;Dressing;Stairs;Bend;Lift    Examination-Participation Restrictions Community Activity;Occupation;Driving;Meal Prep;Cleaning;Shop;Laundry    Stability/Clinical Decision Making Evolving/Moderate complexity    Rehab Potential Good    PT Frequency 2x / week    PT Duration 4 weeks    PT Treatment/Interventions ADLs/Self Care Home Management;Cryotherapy;Electrical Stimulation;Moist Heat;Neuromuscular re-education;Therapeutic exercise;Stair training;Gait training;Patient/family education;Orthotic Fit/Training;Manual techniques;Scar mobilization;Taping;Joint Manipulations;Spinal Manipulations    PT Home Exercise Plan [id number]; [id number]    Consulted and Agree with Plan of Care Patient             Patient will benefit from skilled therapeutic intervention in order to improve the following deficits and impairments:  Abnormal gait, Decreased balance, Decreased endurance, Decreased  mobility, Difficulty walking, Pain, Decreased strength, Decreased coordination, Decreased activity tolerance, Decreased scar mobility  Visit Diagnosis: Difficulty in walking, not elsewhere classified  Muscle weakness (generalized)     Problem List Patient Active Problem List   Diagnosis Date Noted   Pernicious anemia 11/13/2021   History of cesarean delivery 09/14/2021   Encounter for sterilization 09/14/2021   [redacted] weeks gestation of pregnancy 09/14/2021   Back pain affecting pregnancy in third trimester 08/27/2021   Anemia    B12 deficiency 07/13/2021   Absolute anemia 07/13/2021   Thrombocytopenia affecting pregnancy, antepartum ([place]) 07/10/2021   Depressed mood 06/14/2021   Pelvic pain in pregnancy 06/14/2021   History of cesarean delivery affecting pregnancy 06/14/2021   Prenatal care, subsequent pregnancy, second trimester 02/23/2021   Nausea/vomiting in pregnancy 02/23/2021   Supervision of high risk pregnancy, antepartum 07/02/2019   PTSD (post-traumatic stress disorder) 01/08/2013   MDD (major depressive disorder), recurrent episode, severe (HCC) 01/08/2013    Myles Gip PT, DPT [phone]  11/20/2021, 5:54 PM  [place] [facility] [address] [place], [place], [address] Phone: [phone]   Fax:  [phone]  Name: Nicole Hartman MRN: [id number] Date of Birth: [birth year]

## 2021-11-22 ENCOUNTER — Encounter: Payer: Medicaid Other | Admitting: Physical Therapy

## 2021-11-23 ENCOUNTER — Encounter: Payer: Self-pay | Admitting: Physical Therapy

## 2021-11-23 ENCOUNTER — Other Ambulatory Visit: Payer: Self-pay

## 2021-11-23 ENCOUNTER — Ambulatory Visit: Payer: Medicaid Other | Admitting: Physical Therapy

## 2021-11-23 DIAGNOSIS — R262 Difficulty in walking, not elsewhere classified: Secondary | ICD-10-CM

## 2021-11-23 DIAGNOSIS — M6281 Muscle weakness (generalized): Secondary | ICD-10-CM

## 2021-11-23 NOTE — Therapy (Signed)
[facility] [address] [place], [place], [address] Phone: [phone]   Fax:  [phone]  Physical Therapy Treatment  Patient Details  Name: Nicole Hartman MRN: [id number] Date of Birth: 1995-11-21 Referring Provider (PT): [place], [place]   Encounter Date: 11/23/2021   PT End of Session - 11/23/21 1206     Visit Number 11    Number of Visits 20    Date for PT Re-Evaluation 12/18/21    PT Start Time 0805    PT Stop Time 0845    PT Time Calculation (min) 40 min    Activity Tolerance Patient tolerated treatment well;Patient limited by fatigue    Behavior During Therapy [facility] for tasks assessed/performed             Past Medical History:  Diagnosis Date   Anemia    Asthma    exercise induced   Cesarean delivery delivered 03/02/2016   Depression 2014   GERD (gastroesophageal reflux disease)    Headache    h/o migraines   PTSD (post-traumatic stress disorder) 2014   Thrombocytopenia (HCC)     Past Surgical History:  Procedure Laterality Date   CESAREAN SECTION N/A 03/01/2016   Procedure: CESAREAN SECTION;  Surgeon: Conard Novak, MD;  Location: [facility];  Service: Obstetrics;  Laterality: N/A;   CESAREAN SECTION N/A 02/11/2020   Procedure: CESAREAN SECTION;  Surgeon: Conard Novak, MD;  Location: [facility];  Service: Obstetrics;  Laterality: N/A;  RNFA   CESAREAN SECTION WITH BILATERAL TUBAL LIGATION N/A 09/14/2021   Procedure: CESAREAN SECTION WITH BILATERAL TUBAL LIGATION;  Surgeon: Conard Novak, MD;  Location: [facility];  Service: Obstetrics;  Laterality: N/A;    There were no vitals filed for this visit.   Subjective Assessment - 11/23/21 1203     Subjective Patient reports that she was only sore/shaky in her RLE for an hour or so after last session NMES. Patient notes she continues to work on [employer]. Did have one instane of increased pain with descending stairs; does not report fall or buckling.  States that pain is mostly in the lateral R thigh at this time, but she will occasionally have low back pain upon laying down.    Currently in Pain? Yes    Pain Score 4     Pain Location Leg    Pain Orientation Right            TREATMENT Therapeutic Exercise: Sci-fit, seat 2, L0, for warm-up, during history taking, x5 min Total gym squats, hip width,2 x10 with faded assistance for initiation.TCs for improved motor control Total gym squats with hip adductor isometric, x10 with faded assistance for initiation.  SLR, x8, no PT assistance for initiation of movement Supine SAQ x15 with eccentric lowering R clamshell, YTB, x10 with TCs for improved motor control  Patient educated throughout session on appropriate technique and form using multi-modal cueing, HEP, and activity modification. Patient articulated understanding and returned demonstration.  Patient Response to interventions: Does not report increased pain.  ASSESSMENT Patient presents to clinic with excellent motivation to participate in therapy. Patient demonstrates deficits in RLE strength, gait, pain, and function. Patient with improved ability to perform SLR with knee extension maintained for increased repetitions during today's session and responded positively to active interventions with tactile cueing for maximum muscle contraction. Patient will benefit from continued skilled therapeutic intervention to address remaining deficits in RLE strength, gait, pain, and function in order to increase function  and improve overall QOL.   PT Long Term Goals - 11/20/21 1742       PT LONG TERM GOAL #1   Title Patient will be independent with HEP in order to improve strength and balance in order to decrease fall risk and improve function at home and in the community.    Baseline IE: not initiated; 2/7: IND    Time 6    Period Weeks    Status Achieved    Target Date 11/15/21      PT LONG TERM GOAL #2   Title Patient will decrease  5TSTS to < 12 seconds without UE support nor compensatory patterns in order to demonstrate clinically significant improvement in LE strength and to approach peer normative values.    Baseline IE: 63.4 sec (LLE bias); 2/7: intermittent UE support no bias 45.5 sec, LLE bias 20.5 sec    Time 4    Period Weeks    Status On-going    Target Date 12/18/21      PT LONG TERM GOAL #3   Title Patient will decrease TUG to below 14 seconds/decrease in order to demonstrate decreased fall risk.    Baseline IE: assess at next visit    Time 6    Period Weeks    Status Deferred    Target Date --      PT LONG TERM GOAL #4   Title Patient will increase by at least 83m (117ft) in order to demonstrate clinically significant improvement in cardiopulmonary endurance and community ambulation    Baseline IE: assess at next visit; 12/27: 2 min walk test: 56 ft, 5/10 pain (burning), RW, SBA, 2/7: deferred    Time 4    Period Weeks    Status On-going    Target Date 12/18/21      PT LONG TERM GOAL #5   Title Patient will demonstrate improved function as evidenced by a score of 68 on FOTO measure for full participation in activities at home and in the community.    Baseline IE: 50; 1/30: 58    Time 4    Period Weeks    Status On-going    Target Date 12/18/21                   Plan - 11/23/21 1206     Clinical Impression Statement Patient presents to clinic with excellent motivation to participate in therapy. Patient demonstrates deficits in RLE strength, gait, pain, and function. Patient with improved ability to perform SLR with knee extension maintained for increased repetitions during today's session and responded positively to active interventions with tactile cueing for maximum muscle contraction. Patient will benefit from continued skilled therapeutic intervention to address remaining deficits in RLE strength, gait, pain, and function in order to increase function and improve overall QOL.     Personal Factors and Comorbidities Comorbidity 3+;Time since onset of injury/illness/exacerbation    Comorbidities anemia, asthma, depression, GERD, migraines, PTSD    Examination-Activity Limitations Bathing;Hygiene/Grooming;Toileting;Squat;Sleep;Transfers;Dressing;Stairs;Bend;Lift    Examination-Participation Restrictions Community Activity;Occupation;Driving;Meal Prep;Cleaning;Shop;Laundry    Stability/Clinical Decision Making Evolving/Moderate complexity    Rehab Potential Good    PT Frequency 2x / week    PT Duration 4 weeks    PT Treatment/Interventions ADLs/Self Care Home Management;Cryotherapy;Electrical Stimulation;Moist Heat;Neuromuscular re-education;Therapeutic exercise;Stair training;Gait training;Patient/family education;Orthotic Fit/Training;Manual techniques;Scar mobilization;Taping;Joint Manipulations;Spinal Manipulations    PT Home Exercise Plan [id number]; [id number]    Consulted and Agree with Plan of Care Patient  Patient will benefit from skilled therapeutic intervention in order to improve the following deficits and impairments:  Abnormal gait, Decreased balance, Decreased endurance, Decreased mobility, Difficulty walking, Pain, Decreased strength, Decreased coordination, Decreased activity tolerance, Decreased scar mobility  Visit Diagnosis: Difficulty in walking, not elsewhere classified  Muscle weakness (generalized)     Problem List Patient Active Problem List   Diagnosis Date Noted   Pernicious anemia 11/13/2021   History of cesarean delivery 09/14/2021   Encounter for sterilization 09/14/2021   [redacted] weeks gestation of pregnancy 09/14/2021   Back pain affecting pregnancy in third trimester 08/27/2021   Anemia    B12 deficiency 07/13/2021   Absolute anemia 07/13/2021   Thrombocytopenia affecting pregnancy, antepartum (HCC) 07/10/2021   Depressed mood 06/14/2021   Pelvic pain in pregnancy 06/14/2021   History of cesarean delivery affecting  pregnancy 06/14/2021   Prenatal care, subsequent pregnancy, second trimester 02/23/2021   Nausea/vomiting in pregnancy 02/23/2021   Supervision of high risk pregnancy, antepartum 07/02/2019   PTSD (post-traumatic stress disorder) 01/08/2013   MDD (major depressive disorder), recurrent episode, severe (HCC) 01/08/2013    Sheria Lang PT, DPT [phone]  11/23/2021, 12:13 PM  [facility] [address] [place], [place], [address] Phone: [phone]   Fax:  [phone]  Name: Nicole Hartman MRN: [id number] Date of Birth: March 23, 1996

## 2021-11-29 ENCOUNTER — Ambulatory Visit: Payer: Medicaid Other | Admitting: Physical Therapy

## 2021-11-29 ENCOUNTER — Encounter: Payer: Self-pay | Admitting: Physical Therapy

## 2021-11-29 ENCOUNTER — Other Ambulatory Visit: Payer: Self-pay

## 2021-11-29 DIAGNOSIS — R262 Difficulty in walking, not elsewhere classified: Secondary | ICD-10-CM

## 2021-11-29 DIAGNOSIS — M6281 Muscle weakness (generalized): Secondary | ICD-10-CM

## 2021-11-29 NOTE — Therapy (Signed)
[facility] [address]. [place], [place], [address] Phone: [phone]   Fax:  [phone]  Physical Therapy Treatment  Patient Details  Name: Nicole Hartman MRN: [id number] Date of Birth: 1996/05/17 Referring Provider (PT): [place], [place]   Encounter Date: 11/29/2021   PT End of Session - 11/29/21 1329     Visit Number 12    Number of Visits 20    Date for PT Re-Evaluation 12/18/21    PT Start Time 1330    PT Stop Time 1410    PT Time Calculation (min) 40 min    Activity Tolerance Patient tolerated treatment well;Patient limited by fatigue    Behavior During Therapy [facility] for tasks assessed/performed             Past Medical History:  Diagnosis Date   Anemia    Asthma    exercise induced   Cesarean delivery delivered 03/02/2016   Depression 2014   GERD (gastroesophageal reflux disease)    Headache    h/o migraines   PTSD (post-traumatic stress disorder) 2014   Thrombocytopenia (HCC)     Past Surgical History:  Procedure Laterality Date   CESAREAN SECTION N/A 03/01/2016   Procedure: CESAREAN SECTION;  Surgeon: Conard Novak, MD;  Location: [facility];  Service: Obstetrics;  Laterality: N/A;   CESAREAN SECTION N/A 02/11/2020   Procedure: CESAREAN SECTION;  Surgeon: Conard Novak, MD;  Location: [facility];  Service: Obstetrics;  Laterality: N/A;  RNFA   CESAREAN SECTION WITH BILATERAL TUBAL LIGATION N/A 09/14/2021   Procedure: CESAREAN SECTION WITH BILATERAL TUBAL LIGATION;  Surgeon: Conard Novak, MD;  Location: [facility];  Service: Obstetrics;  Laterality: N/A;    There were no vitals filed for this visit.   Subjective Assessment - 11/29/21 1344     Subjective Patient reports that she has had time to do exercises and a bit of walking. She notes that she had some lingering muscle fatigue and discomfort after last session, but it did not last as long.             TREATMENT Therapeutic  Exercise: , no AD, 538 ft, 2/10 pain at end Reviewed HEP. SLR, RLE, x6 Supine march, RLE, x6 Bridge, x6 STS, RLE bias, x3 STS, no bias, x3  Patient educated throughout session on appropriate technique and form using multi-modal cueing, HEP, and activity modification. Patient articulated understanding and returned demonstration.  Patient Response to interventions: Does not report increased pain.  ASSESSMENT Patient presents to clinic with excellent motivation to participate in therapy. Patient demonstrates deficits in RLE strength, gait, pain, and function. Patient added 482 feet to during today's session and responded positively to active interventions with tactile cueing for maximum muscle contraction. Patient will benefit from continued skilled therapeutic intervention to address remaining deficits in RLE strength, gait, pain, and function in order to increase function and improve overall QOL.    PT Long Term Goals - 11/29/21 1346       PT LONG TERM GOAL #1   Title Patient will be independent with HEP in order to improve strength and balance in order to decrease fall risk and improve function at home and in the community.    Baseline IE: not initiated; 2/7: IND    Time 6    Period Weeks    Status Achieved    Target Date 11/15/21      PT LONG TERM GOAL #2   Title Patient will  decrease 5TSTS to < 12 seconds without UE support nor compensatory patterns in order to demonstrate clinically significant improvement in LE strength and to approach peer normative values.    Baseline IE: 63.4 sec (LLE bias); 2/7: intermittent UE support no bias 45.5 sec, LLE bias 20.5 sec    Time 4    Period Weeks    Status On-going    Target Date 12/18/21      PT LONG TERM GOAL #3   Title Patient will decrease TUG to below 14 seconds/decrease in order to demonstrate decreased fall risk.    Baseline IE: assess at next visit    Time 6    Period Weeks    Status Deferred      PT LONG TERM GOAL  #4   Title Patient will increase by at least 59m (137ft) in order to demonstrate clinically significant improvement in cardiopulmonary endurance and community ambulation    Baseline IE: assess at next visit; 12/27: 2 min walk test: 56 ft, 5/10 pain (burning), RW, SBA, 2/7: deferred; 2/16: 538 ft, no AD, IND, 2/10 pain    Time 4    Period Weeks    Status On-going    Target Date 12/18/21      PT LONG TERM GOAL #5   Title Patient will demonstrate improved function as evidenced by a score of 68 on FOTO measure for full participation in activities at home and in the community.    Baseline IE: 50; 1/30: 58    Time 4    Period Weeks    Status On-going    Target Date 12/18/21                   Plan - 11/29/21 1329     Clinical Impression Statement Patient presents to clinic with excellent motivation to participate in therapy. Patient demonstrates deficits in RLE strength, gait, pain, and function. Patient added 482 feet to during today's session and responded positively to active interventions with tactile cueing for maximum muscle contraction. Patient will benefit from continued skilled therapeutic intervention to address remaining deficits in RLE strength, gait, pain, and function in order to increase function and improve overall QOL.    Personal Factors and Comorbidities Comorbidity 3+;Time since onset of injury/illness/exacerbation    Comorbidities anemia, asthma, depression, GERD, migraines, PTSD    Examination-Activity Limitations Bathing;Hygiene/Grooming;Toileting;Squat;Sleep;Transfers;Dressing;Stairs;Bend;Lift    Examination-Participation Restrictions Community Activity;Occupation;Driving;Meal Prep;Cleaning;Shop;Laundry    Stability/Clinical Decision Making Evolving/Moderate complexity    Rehab Potential Good    PT Frequency 2x / week    PT Duration 4 weeks    PT Treatment/Interventions ADLs/Self Care Home Management;Cryotherapy;Electrical Stimulation;Moist  Heat;Neuromuscular re-education;Therapeutic exercise;Stair training;Gait training;Patient/family education;Orthotic Fit/Training;Manual techniques;Scar mobilization;Taping;Joint Manipulations;Spinal Manipulations    PT Home Exercise Plan [id number]; [id number]    Consulted and Agree with Plan of Care Patient             Patient will benefit from skilled therapeutic intervention in order to improve the following deficits and impairments:  Abnormal gait, Decreased balance, Decreased endurance, Decreased mobility, Difficulty walking, Pain, Decreased strength, Decreased coordination, Decreased activity tolerance, Decreased scar mobility  Visit Diagnosis: Difficulty in walking, not elsewhere classified  Muscle weakness (generalized)     Problem List Patient Active Problem List   Diagnosis Date Noted   Pernicious anemia 11/13/2021   History of cesarean delivery 09/14/2021   Encounter for sterilization 09/14/2021   [redacted] weeks gestation of pregnancy 09/14/2021   Back pain affecting pregnancy in third trimester 08/27/2021  Anemia    B12 deficiency 07/13/2021   Absolute anemia 07/13/2021   Thrombocytopenia affecting pregnancy, antepartum (HCC) 07/10/2021   Depressed mood 06/14/2021   Pelvic pain in pregnancy 06/14/2021   History of cesarean delivery affecting pregnancy 06/14/2021   Prenatal care, subsequent pregnancy, second trimester 02/23/2021   Nausea/vomiting in pregnancy 02/23/2021   Supervision of high risk pregnancy, antepartum 07/02/2019   PTSD (post-traumatic stress disorder) 01/08/2013   MDD (major depressive disorder), recurrent episode, severe (HCC) 01/08/2013    Sheria Lang PT, DPT [phone]  11/29/2021, 6:14 PM  [facility] [address] [place], [place], [address] Phone: [phone]   Fax:  [phone]  Name: Nicole Hartman MRN: [id number] Date of Birth: Sep 03, 1996

## 2021-12-04 ENCOUNTER — Ambulatory Visit: Payer: Medicaid Other | Admitting: Physical Therapy

## 2021-12-06 ENCOUNTER — Other Ambulatory Visit: Payer: Self-pay

## 2021-12-06 ENCOUNTER — Ambulatory Visit: Payer: Medicaid Other | Admitting: Physical Therapy

## 2021-12-06 ENCOUNTER — Encounter: Payer: Self-pay | Admitting: Physical Therapy

## 2021-12-06 DIAGNOSIS — R262 Difficulty in walking, not elsewhere classified: Secondary | ICD-10-CM

## 2021-12-06 DIAGNOSIS — M6281 Muscle weakness (generalized): Secondary | ICD-10-CM

## 2021-12-06 NOTE — Therapy (Signed)
[facility] [address] [place], [place], [address] Phone: [phone]   Fax:  [phone]  Physical Therapy Treatment  Patient Details  Name: Nicole Hartman MRN: [id number] Date of Birth: [birth year] Referring Provider (PT): [place], [place]   Encounter Date: 12/06/2021   PT End of Session - 12/06/21 1803     Visit Number 13    Number of Visits 20    Date for PT Re-Evaluation 12/18/21    PT Start Time 1330    PT Stop Time 1410    PT Time Calculation (min) 40 min    Activity Tolerance Patient tolerated treatment well;Patient limited by fatigue    Behavior During Therapy [facility] for tasks assessed/performed             Past Medical History:  Diagnosis Date   Anemia    Asthma    exercise induced   Cesarean delivery delivered 03/02/2016   Depression 2014   GERD (gastroesophageal reflux disease)    Headache    h/o migraines   PTSD (post-traumatic stress disorder) 2014   Thrombocytopenia (HCC)     Past Surgical History:  Procedure Laterality Date   CESAREAN SECTION N/A 03/01/2016   Procedure: CESAREAN SECTION;  Surgeon: Conard Novak, MD;  Location: [facility];  Service: Obstetrics;  Laterality: N/A;   CESAREAN SECTION N/A 02/11/2020   Procedure: CESAREAN SECTION;  Surgeon: Conard Novak, MD;  Location: [facility];  Service: Obstetrics;  Laterality: N/A;  RNFA   CESAREAN SECTION WITH BILATERAL TUBAL LIGATION N/A 09/14/2021   Procedure: CESAREAN SECTION WITH BILATERAL TUBAL LIGATION;  Surgeon: Conard Novak, MD;  Location: [facility];  Service: Obstetrics;  Laterality: N/A;    There were no vitals filed for this visit.   Subjective Assessment - 12/06/21 1336     Subjective Patient notes that she has been doing well and her leg is doing better. Patient notes she is not having pain as much as she is having numbness. Patient was able to go up steps with RLE leading and while it hurt, she did not experience any  buckling.    Currently in Pain? Yes    Pain Score 3     Pain Location Leg    Pain Orientation Right            TREATMENT Therapeutic Exercise: Sci-fit, seat 2, L0, for warm-up, during history taking, x5 min Total gym squats, hip width, x10.TCs for improved motor control Total gym squats narrow, x10 with faded assistance for initiation.  Total gym squats sumo, x10 R 6" step up, to fatigue, faded UE support, TCs for motor control at knee and hip R 6" step down, to fatigue, BUE support, mod A at R knee for motor control B calf raises with BUE support to fatigue  Patient educated throughout session on appropriate technique and form using multi-modal cueing, HEP, and activity modification. Patient articulated understanding and returned demonstration.  Patient Response to interventions: 6/10  ASSESSMENT Patient presents to clinic with excellent motivation to participate in therapy. Patient demonstrates deficits in RLE strength, gait, pain, and function. Patient ambulating with improved load acceptance on RLE but continues to lack consistent knee extension motor control with occasional-frequent reliance on ligamentous stability and joint congruency for TKE. Patient able to perform step down with RLE responsible for eccentric control with mod A at knee during today's session and responded positively to active interventions with tactile cueing for maximum muscle contraction and motor control.  Patient will benefit from continued skilled therapeutic intervention to address remaining deficits in RLE strength, gait, pain, and function in order to increase function and improve overall QOL.    PT Long Term Goals - 11/29/21 1346       PT LONG TERM GOAL #1   Title Patient will be independent with HEP in order to improve strength and balance in order to decrease fall risk and improve function at home and in the community.    Baseline IE: not initiated; 2/7: IND    Time 6    Period Weeks    Status  Achieved    Target Date 11/15/21      PT LONG TERM GOAL #2   Title Patient will decrease 5TSTS to < 12 seconds without UE support nor compensatory patterns in order to demonstrate clinically significant improvement in LE strength and to approach peer normative values.    Baseline IE: 63.4 sec (LLE bias); 2/7: intermittent UE support no bias 45.5 sec, LLE bias 20.5 sec    Time 4    Period Weeks    Status On-going    Target Date 12/18/21      PT LONG TERM GOAL #3   Title Patient will decrease TUG to below 14 seconds/decrease in order to demonstrate decreased fall risk.    Baseline IE: assess at next visit    Time 6    Period Weeks    Status Deferred      PT LONG TERM GOAL #4   Title Patient will increase by at least 80m (158ft) in order to demonstrate clinically significant improvement in cardiopulmonary endurance and community ambulation    Baseline IE: assess at next visit; 12/27: 2 min walk test: 56 ft, 5/10 pain (burning), RW, SBA, 2/7: deferred; 2/16: 538 ft, no AD, IND, 2/10 pain    Time 4    Period Weeks    Status On-going    Target Date 12/18/21      PT LONG TERM GOAL #5   Title Patient will demonstrate improved function as evidenced by a score of 68 on FOTO measure for full participation in activities at home and in the community.    Baseline IE: 50; 1/30: 58    Time 4    Period Weeks    Status On-going    Target Date 12/18/21                   Plan - 12/06/21 1805     Clinical Impression Statement Patient presents to clinic with excellent motivation to participate in therapy. Patient demonstrates deficits in RLE strength, gait, pain, and function. Patient ambulating with improved load acceptance on RLE but continues to lack consistent knee extension motor control with occasional-frequent reliance on ligamentous stability and joint congruency for TKE. Patient able to perform step down with RLE responsible for eccentric control with mod A at knee during  today's session and responded positively to active interventions with tactile cueing for maximum muscle contraction and motor control. Patient will benefit from continued skilled therapeutic intervention to address remaining deficits in RLE strength, gait, pain, and function in order to increase function and improve overall QOL.    Personal Factors and Comorbidities Comorbidity 3+;Time since onset of injury/illness/exacerbation    Comorbidities anemia, asthma, depression, GERD, migraines, PTSD    Examination-Activity Limitations Bathing;Hygiene/Grooming;Toileting;Squat;Sleep;Transfers;Dressing;Stairs;Bend;Lift    Examination-Participation Restrictions Community Activity;Occupation;Driving;Meal Prep;Cleaning;Shop;Laundry    Stability/Clinical Decision Making Evolving/Moderate complexity    Rehab Potential Good  PT Frequency 2x / week    PT Duration 4 weeks    PT Treatment/Interventions ADLs/Self Care Home Management;Cryotherapy;Electrical Stimulation;Moist Heat;Neuromuscular re-education;Therapeutic exercise;Stair training;Gait training;Patient/family education;Orthotic Fit/Training;Manual techniques;Scar mobilization;Taping;Joint Manipulations;Spinal Manipulations    PT Home Exercise Plan [email or web address]; [id number]    Consulted and Agree with Plan of Care Patient             Patient will benefit from skilled therapeutic intervention in order to improve the following deficits and impairments:  Abnormal gait, Decreased balance, Decreased endurance, Decreased mobility, Difficulty walking, Pain, Decreased strength, Decreased coordination, Decreased activity tolerance, Decreased scar mobility  Visit Diagnosis: Difficulty in walking, not elsewhere classified  Muscle weakness (generalized)     Problem List Patient Active Problem List   Diagnosis Date Noted   Pernicious anemia 11/13/2021   History of cesarean delivery 09/14/2021   Encounter for sterilization 09/14/2021   [redacted] weeks gestation of  pregnancy 09/14/2021   Back pain affecting pregnancy in third trimester 08/27/2021   Anemia    B12 deficiency 07/13/2021   Absolute anemia 07/13/2021   Thrombocytopenia affecting pregnancy, antepartum (HCC) 07/10/2021   Depressed mood 06/14/2021   Pelvic pain in pregnancy 06/14/2021   History of cesarean delivery affecting pregnancy 06/14/2021   Prenatal care, subsequent pregnancy, second trimester 02/23/2021   Nausea/vomiting in pregnancy 02/23/2021   Supervision of high risk pregnancy, antepartum 07/02/2019   PTSD (post-traumatic stress disorder) 01/08/2013   MDD (major depressive disorder), recurrent episode, severe (HCC) 01/08/2013    Sheria Lang PT, DPT [phone]  12/06/2021, 6:11 PM  [facility] [address] [place], [place], [address] Phone: [phone]   Fax:  [phone]  Name: Nicole Hartman MRN: [id number] Date of Birth: 1996/04/16

## 2021-12-12 ENCOUNTER — Ambulatory Visit: Payer: Medicaid Other | Attending: Obstetrics and Gynecology | Admitting: Physical Therapy

## 2021-12-12 ENCOUNTER — Other Ambulatory Visit: Payer: Self-pay

## 2021-12-12 DIAGNOSIS — M6281 Muscle weakness (generalized): Secondary | ICD-10-CM | POA: Diagnosis present

## 2021-12-12 DIAGNOSIS — R262 Difficulty in walking, not elsewhere classified: Secondary | ICD-10-CM | POA: Insufficient documentation

## 2021-12-13 ENCOUNTER — Encounter: Payer: Self-pay | Admitting: Physical Therapy

## 2021-12-13 NOTE — Therapy (Signed)
[facility] [address] [place], [place], [address] Phone: [phone]   Fax:  [phone]  Physical Therapy Treatment  Patient Details  Name: Nicole Hartman MRN: [id number] Date of Birth: 1995-10-27 Referring Provider (PT): [place], [place]   Encounter Date: 12/12/2021   PT End of Session - 12/13/21 1706     Visit Number 14    Number of Visits 20    Date for PT Re-Evaluation 12/18/21    PT Start Time 1545    PT Stop Time 1625    PT Time Calculation (min) 40 min    Activity Tolerance Patient tolerated treatment well;Patient limited by fatigue    Behavior During Therapy [facility] for tasks assessed/performed             Past Medical History:  Diagnosis Date   Anemia    Asthma    exercise induced   Cesarean delivery delivered 03/02/2016   Depression 2014   GERD (gastroesophageal reflux disease)    Headache    h/o migraines   PTSD (post-traumatic stress disorder) 2014   Thrombocytopenia (HCC)     Past Surgical History:  Procedure Laterality Date   CESAREAN SECTION N/A 03/01/2016   Procedure: CESAREAN SECTION;  Surgeon: Conard Novak, MD;  Location: [facility];  Service: Obstetrics;  Laterality: N/A;   CESAREAN SECTION N/A 02/11/2020   Procedure: CESAREAN SECTION;  Surgeon: Conard Novak, MD;  Location: [facility];  Service: Obstetrics;  Laterality: N/A;  RNFA   CESAREAN SECTION WITH BILATERAL TUBAL LIGATION N/A 09/14/2021   Procedure: CESAREAN SECTION WITH BILATERAL TUBAL LIGATION;  Surgeon: Conard Novak, MD;  Location: [facility];  Service: Obstetrics;  Laterality: N/A;    There were no vitals filed for this visit.   Subjective Assessment - 12/13/21 1705     Subjective Patient returned to work yesterday and had increased symptoms as day progressed. Noted increased numbness and burning to the foot with foot drop by the end of the day. Patient was very fatigued by evening and awoke today with some soreness,  but is ameneable to therapy.            TREATMENT Therapeutic Exercise: Sci-fit, seat 2, L0, for warm-up, during history taking, x5 min Total gym squats, hip width, x10.TCs for improved motor control Total gym squats narrow, x10 with faded assistance for initiation.  Total gym squats sumo, x10 Pilates postural series: serve a tray, hug a tree, T arabesque, mini squat isometric with hip adduction pulses  Patient educated throughout session on appropriate technique and form using multi-modal cueing, HEP, and activity modification. Patient articulated understanding and returned demonstration.  Patient Response to interventions: Does not report increased pain, notes a good workout  ASSESSMENT Patient presents to clinic with excellent motivation to participate in therapy. Patient demonstrates deficits in RLE strength, gait, pain, and function. Patient with consistent and strong TrA contraction in standing and l stretch postures during today's session and responded positively to active interventions with tactile cueing for maximum muscle contraction and motor control. Patient will benefit from continued skilled therapeutic intervention to address remaining deficits in RLE strength, gait, pain, and function in order to increase function and improve overall QOL.    PT Long Term Goals - 11/29/21 1346       PT LONG TERM GOAL #1   Title Patient will be independent with HEP in order to improve strength and balance in order to decrease fall risk and improve function at  home and in the community.    Baseline IE: not initiated; 2/7: IND    Time 6    Period Weeks    Status Achieved    Target Date 11/15/21      PT LONG TERM GOAL #2   Title Patient will decrease 5TSTS to < 12 seconds without UE support nor compensatory patterns in order to demonstrate clinically significant improvement in LE strength and to approach peer normative values.    Baseline IE: 63.4 sec (LLE bias); 2/7: intermittent UE  support no bias 45.5 sec, LLE bias 20.5 sec    Time 4    Period Weeks    Status On-going    Target Date 12/18/21      PT LONG TERM GOAL #3   Title Patient will decrease TUG to below 14 seconds/decrease in order to demonstrate decreased fall risk.    Baseline IE: assess at next visit    Time 6    Period Weeks    Status Deferred      PT LONG TERM GOAL #4   Title Patient will increase by at least 68m (19ft) in order to demonstrate clinically significant improvement in cardiopulmonary endurance and community ambulation    Baseline IE: assess at next visit; 12/27: 2 min walk test: 56 ft, 5/10 pain (burning), RW, SBA, 2/7: deferred; 2/16: 538 ft, no AD, IND, 2/10 pain    Time 4    Period Weeks    Status On-going    Target Date 12/18/21      PT LONG TERM GOAL #5   Title Patient will demonstrate improved function as evidenced by a score of 68 on FOTO measure for full participation in activities at home and in the community.    Baseline IE: 50; 1/30: 58    Time 4    Period Weeks    Status On-going    Target Date 12/18/21                   Plan - 12/13/21 1707     Clinical Impression Statement Patient presents to clinic with excellent motivation to participate in therapy. Patient demonstrates deficits in RLE strength, gait, pain, and function. Patient with consistent and strong TrA contraction in standing and l stretch postures during today's session and responded positively to active interventions with tactile cueing for maximum muscle contraction and motor control. Patient will benefit from continued skilled therapeutic intervention to address remaining deficits in RLE strength, gait, pain, and function in order to increase function and improve overall QOL.    Personal Factors and Comorbidities Comorbidity 3+;Time since onset of injury/illness/exacerbation    Comorbidities anemia, asthma, depression, GERD, migraines, PTSD    Examination-Activity Limitations  Bathing;Hygiene/Grooming;Toileting;Squat;Sleep;Transfers;Dressing;Stairs;Bend;Lift    Examination-Participation Restrictions Community Activity;Occupation;Driving;Meal Prep;Cleaning;Shop;Laundry    Stability/Clinical Decision Making Evolving/Moderate complexity    Rehab Potential Good    PT Frequency 2x / week    PT Duration 4 weeks    PT Treatment/Interventions ADLs/Self Care Home Management;Cryotherapy;Electrical Stimulation;Moist Heat;Neuromuscular re-education;Therapeutic exercise;Stair training;Gait training;Patient/family education;Orthotic Fit/Training;Manual techniques;Scar mobilization;Taping;Joint Manipulations;Spinal Manipulations    PT Home Exercise Plan [id number]; [id number]    Consulted and Agree with Plan of Care Patient             Patient will benefit from skilled therapeutic intervention in order to improve the following deficits and impairments:  Abnormal gait, Decreased balance, Decreased endurance, Decreased mobility, Difficulty walking, Pain, Decreased strength, Decreased coordination, Decreased activity tolerance, Decreased scar mobility  Visit Diagnosis: Difficulty  in walking, not elsewhere classified  Muscle weakness (generalized)     Problem List Patient Active Problem List   Diagnosis Date Noted   Pernicious anemia 11/13/2021   History of cesarean delivery 09/14/2021   Encounter for sterilization 09/14/2021   [redacted] weeks gestation of pregnancy 09/14/2021   Back pain affecting pregnancy in third trimester 08/27/2021   Anemia    B12 deficiency 07/13/2021   Absolute anemia 07/13/2021   Thrombocytopenia affecting pregnancy, antepartum (HCC) 07/10/2021   Depressed mood 06/14/2021   Pelvic pain in pregnancy 06/14/2021   History of cesarean delivery affecting pregnancy 06/14/2021   Prenatal care, subsequent pregnancy, second trimester 02/23/2021   Nausea/vomiting in pregnancy 02/23/2021   Supervision of high risk pregnancy, antepartum 07/02/2019   PTSD  (post-traumatic stress disorder) 01/08/2013   MDD (major depressive disorder), recurrent episode, severe (HCC) 01/08/2013    Sheria Lang PT, DPT [phone]  12/13/2021, 5:13 PM  [facility] [address] [place], [place], [address] Phone: [phone]   Fax:  [phone]  Name: Nicole Hartman MRN: [id number] Date of Birth: 31-Jan-1996

## 2021-12-19 ENCOUNTER — Ambulatory Visit: Payer: Medicaid Other | Admitting: Physical Therapy

## 2021-12-19 ENCOUNTER — Encounter: Payer: Self-pay | Admitting: Physical Therapy

## 2021-12-19 ENCOUNTER — Other Ambulatory Visit: Payer: Self-pay

## 2021-12-19 DIAGNOSIS — R262 Difficulty in walking, not elsewhere classified: Secondary | ICD-10-CM | POA: Diagnosis not present

## 2021-12-19 DIAGNOSIS — M6281 Muscle weakness (generalized): Secondary | ICD-10-CM

## 2021-12-19 NOTE — Therapy (Signed)
[facility] [address] [place], [place], [address] Phone: [phone]   Fax:  [phone]  Physical Therapy Treatment  Patient Details  Name: Nicole Hartman MRN: [id number] Date of Birth: [birth year] Referring Provider (PT): Pedro BaySchuman, [place]anna   Encounter Date: 12/19/2021   PT End of Session - 12/19/21 1558     Visit Number 15    Number of Visits 26    Date for PT Re-Evaluation 01/30/22    PT Start Time 1545    PT Stop Time 1625    PT Time Calculation (min) 40 min    Activity Tolerance Patient tolerated treatment well    Behavior During [facility] for tasks assessed/performed             Past Medical History:  Diagnosis Date   Anemia    Asthma    exercise induced   Cesarean delivery delivered 03/02/2016   Depression 2014   GERD (gastroesophageal reflux disease)    Headache    h/o migraines   PTSD (post-traumatic stress disorder) 2014   Thrombocytopenia (HCC)     Past Surgical History:  Procedure Laterality Date   CESAREAN SECTION N/A 03/01/2016   Procedure: CESAREAN SECTION;  Surgeon: Conard NovakStephen D Jackson, MD;  Location: [facility];  Service: Obstetrics;  Laterality: N/A;   CESAREAN SECTION N/A 02/11/2020   Procedure: CESAREAN SECTION;  Surgeon: Conard NovakJackson, Stephen D, MD;  Location: [facility];  Service: Obstetrics;  Laterality: N/A;  RNFA   CESAREAN SECTION WITH BILATERAL TUBAL LIGATION N/A 09/14/2021   Procedure: CESAREAN SECTION WITH BILATERAL TUBAL LIGATION;  Surgeon: Conard NovakJackson, Stephen D, MD;  Location: [facility];  Service: Obstetrics;  Laterality: N/A;    There were no vitals filed for this visit.   Subjective Assessment - 12/19/21 1556     Subjective Patient presents to clinic with improved gait pattern. Patient notes that she continues to work and have increased foot drop at the en dof the day with burning symptoms down to the foot R. Patient clarifies that the symptoms take longer to increase in intensity but  the numbness onsets almost immediately with standing tasks.    Currently in Pain? Yes    Pain Score 3     Pain Location Leg    Pain Orientation Right    Pain Descriptors / Indicators Numbness            TREATMENT Therapeutic Exercise: Sci-fit, seat 2, L0, for warm-up, during history taking, x5 min Total gym squats, hip width, x10.TCs for improved motor control Total gym squats sumo, x10 Reassessed goals; see below.  Patient educated throughout session on appropriate technique and form using multi-modal cueing, HEP, and activity modification. Patient articulated understanding and returned demonstration.  Patient Response to interventions: Does not report increased pain, notes a good workout  ASSESSMENT Patient presents to clinic with excellent motivation to participate in therapy. Patient demonstrates deficits in RLE strength, gait, pain, and function. Patient indicating significant progress toward goal achievement (as noted below) during today's session and has responded positively to active interventions throughout the course of care. Patient has not returned to PLOF and will benefit from continued skilled therapeutic intervention to address remaining deficits in RLE strength, gait, pain, and function in order to increase function and improve overall QOL.    PT Long Term Goals - 12/19/21 1601       PT LONG TERM GOAL #1   Title Patient will be independent with HEP in order to improve  strength and balance in order to decrease fall risk and improve function at home and in the community.    Baseline IE: not initiated; 2/7: IND    Time 6    Period Weeks    Status Achieved    Target Date 11/15/21      PT LONG TERM GOAL #2   Title Patient will decrease 5TSTS to < 12 seconds without UE support nor compensatory patterns in order to demonstrate clinically significant improvement in LE strength and to approach peer normative values.    Baseline IE: 63.4 sec (LLE bias); 2/7:  intermittent UE support no bias 45.5 sec, LLE bias 20.5 sec; 3/8: 14.54 sec, UE support, no bias    Time 6    Period Weeks    Status On-going    Target Date 01/30/22      PT LONG TERM GOAL #3   Title Patient will decrease TUG to below 14 seconds/decrease in order to demonstrate decreased fall risk.    Baseline IE: assess at next visit    Time 6    Period Weeks    Status Deferred      PT LONG TERM GOAL #4   Title Patient will increase by at least 52m (171ft) in order to demonstrate clinically significant improvement in cardiopulmonary endurance and community ambulation    Baseline IE: assess at next visit; 12/27: 2 min walk test: 56 ft, 5/10 pain (burning), RW, SBA, 2/7: deferred; 2/16: 538 ft, no AD, IND, 2/10 pain; 3/8: 784 ft, no AD, IND, 5/10 pain    Time 6    Period Weeks    Status On-going    Target Date 01/30/22      PT LONG TERM GOAL #5   Title Patient will demonstrate improved function as evidenced by a score of 68 on FOTO measure for full participation in activities at home and in the community.    Baseline IE: 50; 1/30: 58; 3/8: 64    Time 6    Period Weeks    Status On-going    Target Date 01/30/22                   Plan - 12/19/21 1601     Clinical Impression Statement Patient presents to clinic with excellent motivation to participate in therapy. Patient demonstrates deficits in RLE strength, gait, pain, and function. Patient indicating significant progress toward goal achievement (as noted below) during today's session and has responded positively to active interventions throughout the course of care. Patient has not returned to PLOF and will benefit from continued skilled therapeutic intervention to address remaining deficits in RLE strength, gait, pain, and function in order to increase function and improve overall QOL.    Personal Factors and Comorbidities Comorbidity 3+;Time since onset of injury/illness/exacerbation    Comorbidities anemia, asthma,  depression, GERD, migraines, PTSD    Examination-Activity Limitations Bathing;Hygiene/Grooming;Toileting;Squat;Sleep;Transfers;Dressing;Stairs;Bend;Lift    Examination-Participation Restrictions Community Activity;Occupation;Driving;Meal Prep;Cleaning;Shop;Laundry    Stability/Clinical Decision Making Evolving/Moderate complexity    Rehab Potential Good    PT Frequency 1x / week    PT Duration 6 weeks    PT Treatment/Interventions ADLs/Self Care Home Management;Cryotherapy;Electrical Stimulation;Moist Heat;Neuromuscular re-education;Therapeutic exercise;Stair training;Gait training;Patient/family education;Orthotic Fit/Training;Manual techniques;Scar mobilization;Taping;Joint Manipulations;Spinal Manipulations    PT Home Exercise Plan [id number]; [id number]    Consulted and Agree with Plan of Care Patient             Patient will benefit from skilled therapeutic intervention in order to improve the following  deficits and impairments:  Abnormal gait, Decreased balance, Decreased endurance, Decreased mobility, Difficulty walking, Pain, Decreased strength, Decreased coordination, Decreased activity tolerance, Decreased scar mobility  Visit Diagnosis: Difficulty in walking, not elsewhere classified  Muscle weakness (generalized)     Problem List Patient Active Problem List   Diagnosis Date Noted   Pernicious anemia 11/13/2021   History of cesarean delivery 09/14/2021   Encounter for sterilization 09/14/2021   [redacted] weeks gestation of pregnancy 09/14/2021   Back pain affecting pregnancy in third trimester 08/27/2021   Anemia    B12 deficiency 07/13/2021   Absolute anemia 07/13/2021   Thrombocytopenia affecting pregnancy, antepartum (HCC) 07/10/2021   Depressed mood 06/14/2021   Pelvic pain in pregnancy 06/14/2021   History of cesarean delivery affecting pregnancy 06/14/2021   Prenatal care, subsequent pregnancy, second trimester 02/23/2021   Nausea/vomiting in pregnancy 02/23/2021    Supervision of high risk pregnancy, antepartum 07/02/2019   PTSD (post-traumatic stress disorder) 01/08/2013   MDD (major depressive disorder), recurrent episode, severe (HCC) 01/08/2013    Sheria Lang PT, DPT [phone]  12/19/2021, 4:32 PM  [facility] [address] [place], [place], [address] Phone: [phone]   Fax:  [phone]  Name: Nicole Hartman MRN: [id number] Date of Birth: [birth year]

## 2021-12-21 ENCOUNTER — Ambulatory Visit: Payer: Medicaid Other | Admitting: Obstetrics and Gynecology

## 2021-12-26 ENCOUNTER — Encounter: Payer: Self-pay | Admitting: Physical Therapy

## 2021-12-26 ENCOUNTER — Encounter: Payer: Self-pay | Admitting: Obstetrics and Gynecology

## 2021-12-26 ENCOUNTER — Ambulatory Visit (INDEPENDENT_AMBULATORY_CARE_PROVIDER_SITE_OTHER): Payer: Medicaid Other | Admitting: Obstetrics and Gynecology

## 2021-12-26 ENCOUNTER — Other Ambulatory Visit: Payer: Self-pay

## 2021-12-26 ENCOUNTER — Ambulatory Visit: Payer: Medicaid Other | Admitting: Physical Therapy

## 2021-12-26 DIAGNOSIS — M7989 Other specified soft tissue disorders: Secondary | ICD-10-CM

## 2021-12-26 DIAGNOSIS — T148XXA Other injury of unspecified body region, initial encounter: Secondary | ICD-10-CM

## 2021-12-26 DIAGNOSIS — M6281 Muscle weakness (generalized): Secondary | ICD-10-CM

## 2021-12-26 DIAGNOSIS — R262 Difficulty in walking, not elsewhere classified: Secondary | ICD-10-CM | POA: Diagnosis not present

## 2021-12-26 DIAGNOSIS — Z98891 History of uterine scar from previous surgery: Secondary | ICD-10-CM

## 2021-12-26 NOTE — Progress Notes (Signed)
? ?Postpartum Visit  ?Chief Complaint:  ?Chief Complaint  ?Patient presents with  ? Routine Post Op  ? ? ?History of Present Illness: Patient is a 26 y.o. H6P5916 presents for postpartum visit. ? ?Date of delivery: 09/14/2021 ?Type of delivery: cesarean section with Dr. Jean Rosenthal  ? ?Breast Feeding:  yes ?Lochia: none ?Date of last PAP: 2021 NIL  ? ?She reports that she still has diminished strength in her right left. She has been following with physical therapy. She has had some improvement in her functioning, but is not yet meeting goals to stop physical therapy. She is following with neurology at the end of the month. She feels like her right thigh is larger than her left thigh. She feels that her pants are tighter on one side versus the other.  ? ?Newborn Details:  ?SINGLETON :  ?1. Infant Status: Infant doing well at home with mother. ? ? ?Review of Systems: Review of Systems  ?Constitutional:  Negative for chills, fever, malaise/fatigue and weight loss.  ?HENT:  Negative for congestion, hearing loss and sinus pain.   ?Eyes:  Negative for blurred vision and double vision.  ?Respiratory:  Negative for cough, sputum production, shortness of breath and wheezing.   ?Cardiovascular:  Negative for chest pain, palpitations, orthopnea and leg swelling.  ?Gastrointestinal:  Negative for abdominal pain, constipation, diarrhea, nausea and vomiting.  ?Genitourinary:  Negative for dysuria, flank pain, frequency, hematuria and urgency.  ?Musculoskeletal:  Negative for back pain, falls and joint pain.  ?Skin:  Negative for itching and rash.  ?Neurological:  Negative for dizziness and headaches.  ?Psychiatric/Behavioral:  Negative for depression, substance abuse and suicidal ideas. The patient is not nervous/anxious.   ? ?Past Medical History:  ?Past Medical History:  ?Diagnosis Date  ? Anemia   ? Asthma   ? exercise induced  ? Cesarean delivery delivered 03/02/2016  ? Depression 2014  ? GERD (gastroesophageal reflux disease)    ? Headache   ? h/o migraines  ? PTSD (post-traumatic stress disorder) 2014  ? Thrombocytopenia (HCC)   ? ? ?Past Surgical History:  ?Past Surgical History:  ?Procedure Laterality Date  ? CESAREAN SECTION N/A 03/01/2016  ? Procedure: CESAREAN SECTION;  Surgeon: Conard Novak, MD;  Location: [facility];  Service: Obstetrics;  Laterality: N/A;  ? CESAREAN SECTION N/A 02/11/2020  ? Procedure: CESAREAN SECTION;  Surgeon: Conard Novak, MD;  Location: [facility];  Service: Obstetrics;  Laterality: N/A;  RNFA  ? CESAREAN SECTION WITH BILATERAL TUBAL LIGATION N/A 09/14/2021  ? Procedure: CESAREAN SECTION WITH BILATERAL TUBAL LIGATION;  Surgeon: Conard Novak, MD;  Location: [facility];  Service: Obstetrics;  Laterality: N/A;  ? ? ?Family History:  ?Family History  ?Problem Relation Age of Onset  ? Healthy Mother   ? Healthy Father   ? Cancer - Colon Maternal Grandfather   ? Breast cancer Paternal Grandmother   ?     87s  ? Breast cancer Maternal Aunt   ?     not sure of age  ? ? ?Social History:  ?Social History  ? ?Socioeconomic History  ? Marital status: Married  ?  Spouse name: Rolm Gala  ? Number of children: 3  ? Years of education: college  ? Highest education level: Not on file  ?Occupational History  ? Occupation: Sales executive  ?Tobacco Use  ? Smoking status: Never  ? Smokeless tobacco: Never  ?Vaping Use  ? Vaping Use: Never used  ?Substance and Sexual Activity  ?  Alcohol use: No  ? Drug use: No  ? Sexual activity: Yes  ?  Partners: Male  ?  Birth control/protection: Surgical  ?Other Topics Concern  ? Not on file  ?Social History Narrative  ? Lives at home with husband and three children.  ? Right-handed.  ? One cup caffeine per day.  ? ?Social Determinants of Health  ? ?Financial Resource Strain: Not on file  ?Food Insecurity: Not on file  ?Transportation Needs: Not on file  ?Physical Activity: Not on file  ?Stress: Not on file  ?Social Connections: Not on file  ?Intimate Partner Violence: Not on file   ? ? ?Allergies:  ?No Known Allergies ? ?Medications: ?Prior to Admission medications   ?Medication Sig Start Date End Date Taking? Authorizing Provider  ?Cobalamin Combinations (B-12) [phone] MCG SUBL  11/09/21  Yes [provider]  ?albuterol (VENTOLIN HFA) 108 (90 Base) MCG/ACT inhaler Inhale 2 puffs into the lungs 3 (three) times daily as needed for wheezing (for cough). ?Patient not taking: Reported on 12/26/2021    [provider]  ?calcium carbonate (TUMS - DOSED IN MG ELEMENTAL CALCIUM) 500 MG chewable tablet Chew 2 tablets (400 mg of elemental calcium total) by mouth daily as needed for indigestion or heartburn. ?Patient not taking: Reported on 12/26/2021 09/17/21   Vena Austria, MD  ?Ferrous Sulfate (IRON PO) Take 1 tablet by mouth daily. ?Patient not taking: Reported on 12/26/2021    [provider]  ?ibuprofen (ADVIL) 600 MG tablet Take 1 tablet (600 mg total) by mouth every 6 (six) hours. ?Patient not taking: Reported on 12/26/2021 09/17/21   Vena Austria, MD  ?Prenatal Vit-Fe Fumarate-FA (PNV PRENATAL PLUS MULTIVITAMIN) 27-1 MG TABS Take 1 tablet by mouth daily.  ?Patient not taking: Reported on 12/26/2021    [provider]  ? ? ?Physical Exam ?Vitals:  ?Vitals:  ? 12/26/21 0813  ?BP: 120/80  ? ? ?Physical Exam ?Constitutional:   ?   Appearance: Normal appearance. She is well-developed.  ?HENT:  ?   Head: Normocephalic and atraumatic.  ?Eyes:  ?   Extraocular Movements: Extraocular movements intact.  ?   Pupils: Pupils are equal, round, and reactive to light.  ?Neck:  ?   Thyroid: No thyromegaly.  ?Cardiovascular:  ?   Rate and Rhythm: Normal rate and regular rhythm.  ?   Heart sounds: Normal heart sounds.  ?Pulmonary:  ?   Effort: Pulmonary effort is normal.  ?   Breath sounds: Normal breath sounds.  ?Abdominal:  ?   General: Bowel sounds are normal. There is no distension.  ?   Palpations: Abdomen is soft. There is no mass.  ?Musculoskeletal:  ?   Cervical  back: Neck supple.  ?   Comments: Right thigh circumference 24 inches ?Left thigh circumference 22 inches  ?Neurological:  ?   Mental Status: She is alert and oriented to person, place, and time.  ?Skin: ?   General: Skin is warm and dry.  ?Psychiatric:     ?   Behavior: Behavior normal.     ?   Thought Content: Thought content normal.     ?   Judgment: Judgment normal.  ?Vitals reviewed.  ? ? ?Assessment: 26 y.o. [id number] presenting for 3 month postpartum visit ? ?Plan: ?Problem List Items Addressed This Visit   ? ?  ? Other  ? History of cesarean delivery  ? ?Other Visit Diagnoses   ? ? Postpartum care and examination    -  Primary  ? Right leg swelling      ? Relevant Orders  ? US Venous Img Lower Unilateral Right (DVT)  ? Nerve injury      ? ?  ? ? ?1) Contraception-  tubal  ? ?2)  Pap: up to date ? ?3) Patient underwent screening for postpartum depression with n concerns noted. ? ?4) Right leg swelling greater than left with continued deficits, working with physical therapy. Neurology follow up planned. Source of neurological deficit unclear, entrapment versus epidural hematoma. Will check LE US for DVT.   ? ?- Follow up in 2-3 months and establish with new GYN  ? ?Adelene Idlerhristanna Ky Moskowitz MD, FACOG ?[facility], [facility] ?12/26/2021 ?3:34 PM ? ?

## 2021-12-26 NOTE — Therapy (Signed)
[place] [facility] ?[address] [place], [place], [address] ?Phone: [phone]   Fax:  [phone] ? ?Physical Therapy Treatment ? ?Patient Details  ?Name: Nicole Hartman ?MRN: [id number] ?Date of Birth: [birth year] ?Referring Provider (PT): Schuman, Christanna ? ? ?Encounter Date: 12/26/2021 ? ? PT End of Session - 12/26/21 1655   ? ? Visit Number 16   ? Number of Visits 26   ? Date for PT Re-Evaluation 01/30/22   ? PT Start Time 1407   ? PT Stop Time 1448   ? PT Time Calculation (min) 41 min   ? Activity Tolerance Patient tolerated treatment well   ? Behavior During Therapy [facility] for tasks assessed/performed   ? ?  ?  ? ?  ? ? ?Past Medical History:  ?Diagnosis Date  ? Anemia   ? Asthma   ? exercise induced  ? Cesarean delivery delivered 03/02/2016  ? Depression 2014  ? GERD (gastroesophageal reflux disease)   ? Headache   ? h/o migraines  ? PTSD (post-traumatic stress disorder) 2014  ? Thrombocytopenia (HCC)   ? ? ?Past Surgical History:  ?Procedure Laterality Date  ? CESAREAN SECTION N/A 03/01/2016  ? Procedure: CESAREAN SECTION;  Surgeon: Conard NovakStephen D Jackson, MD;  Location: [facility];  Service: Obstetrics;  Laterality: N/A;  ? CESAREAN SECTION N/A 02/11/2020  ? Procedure: CESAREAN SECTION;  Surgeon: Conard NovakJackson, Stephen D, MD;  Location: [facility];  Service: Obstetrics;  Laterality: N/A;  RNFA  ? CESAREAN SECTION WITH BILATERAL TUBAL LIGATION N/A 09/14/2021  ? Procedure: CESAREAN SECTION WITH BILATERAL TUBAL LIGATION;  Surgeon: Conard NovakJackson, Stephen D, MD;  Location: [facility];  Service: Obstetrics;  Laterality: N/A;  ? ? ?There were no vitals filed for this visit. ? ? Subjective Assessment - 12/26/21 1653   ? ? Subjective Patient notes that she had follow up with MD this morning and has been scheduled for US of inguinal region to rule out any possible clotting that may be contributing to groin edema. Patient notes that pain and numbness continue to improve, but symptoms are  exacerbated by increased standing/physical demand.   ? ?  ?  ? ?  ? ?TREATMENT ?Therapeutic Exercise: ?Sci-fit, seat 2, L0, for warm-up, during history taking, x5 min ?Total gym squats, hip width, x10.TCs for improved motor control ?Total gym squats sumo, x10 ?Total gym squats narrow stance with knee block, x10 ?Total gym press, SL, x5 each leg with tactile cues to prevent medial knee collapse ?Supine TrA activation x10 for improved postural control ?Supine TrA activation with posterior pelvic tilt x10 ?Supine TrA activation with scapular protraction x10 ?Supine TrA activation with leg lift x5 each ?Long-sitting serve a tray on Total Gym, x5 ?Patient education on gentle lymph stimulation for improved management of edema.  ? ?Patient educated throughout session on appropriate technique and form using multi-modal cueing, HEP, and activity modification. Patient articulated understanding and returned demonstration. ? ?Patient Response to interventions: ?Notes a good workout ? ?ASSESSMENT ?Patient presents to clinic with excellent motivation to participate in therapy. Patient demonstrates deficits in RLE strength, gait, pain, and function. Patient able to initiate leg press in sidelying with moderate assistance and no assistance in supine during today's session and responded positively to postural interventions. Patient will benefit from continued skilled therapeutic intervention to address remaining deficits in RLE strength, gait, pain, and function in order to increase function and improve overall QOL. ? ? ? PT Long Term Goals - 12/19/21 1601   ? ?  ?  PT LONG TERM GOAL #1  ? Title Patient will be independent with HEP in order to improve strength and balance in order to decrease fall risk and improve function at home and in the community.   ? Baseline IE: not initiated; 2/7: IND   ? Time 6   ? Period Weeks   ? Status Achieved   ? Target Date 11/15/21   ?  ? PT LONG TERM GOAL #2  ? Title Patient will decrease 5TSTS to <  12 seconds without UE support nor compensatory patterns in order to demonstrate clinically significant improvement in LE strength and to approach peer normative values.   ? Baseline IE: 63.4 sec (LLE bias); 2/7: intermittent UE support no bias 45.5 sec, LLE bias 20.5 sec; 3/8: 14.54 sec, UE support, no bias   ? Time 6   ? Period Weeks   ? Status On-going   ? Target Date 01/30/22   ?  ? PT LONG TERM GOAL #3  ? Title Patient will decrease TUG to below 14 seconds/decrease in order to demonstrate decreased fall risk.   ? Baseline IE: assess at next visit   ? Time 6   ? Period Weeks   ? Status Deferred   ?  ? PT LONG TERM GOAL #4  ? Title Patient will increase by at least 35m (17ft) in order to demonstrate clinically significant improvement in cardiopulmonary endurance and community ambulation   ? Baseline IE: assess at next visit; 12/27: 2 min walk test: 56 ft, 5/10 pain (burning), RW, SBA, 2/7: deferred; 2/16: 538 ft, no AD, IND, 2/10 pain; 3/8: 784 ft, no AD, IND, 5/10 pain   ? Time 6   ? Period Weeks   ? Status On-going   ? Target Date 01/30/22   ?  ? PT LONG TERM GOAL #5  ? Title Patient will demonstrate improved function as evidenced by a score of 68 on FOTO measure for full participation in activities at home and in the community.   ? Baseline IE: 50; 1/30: 58; 3/8: 64   ? Time 6   ? Period Weeks   ? Status On-going   ? Target Date 01/30/22   ? ?  ?  ? ?  ? ? ? ? ? ? ? ? Plan - 12/26/21 1655   ? ? Clinical Impression Statement Patient presents to clinic with excellent motivation to participate in therapy. Patient demonstrates deficits in RLE strength, gait, pain, and function. Patient able to initiate leg press in sidelying with moderate assistance and no assistance in supine during today's session and responded positively to postural interventions. Patient will benefit from continued skilled therapeutic intervention to address remaining deficits in RLE strength, gait, pain, and function in order to increase  function and improve overall QOL.   ? Personal Factors and Comorbidities Comorbidity 3+;Time since onset of injury/illness/exacerbation   ? Comorbidities anemia, asthma, depression, GERD, migraines, PTSD   ? Examination-Activity Limitations Bathing;Hygiene/Grooming;Toileting;Squat;Sleep;Transfers;Dressing;Stairs;Bend;Lift   ? Examination-Participation Restrictions Community Activity;Occupation;Driving;Meal Prep;Cleaning;Shop;Laundry   ? Stability/Clinical Decision Making Evolving/Moderate complexity   ? Rehab Potential Good   ? PT Frequency 1x / week   ? PT Duration 6 weeks   ? PT Treatment/Interventions ADLs/Self Care Home Management;Cryotherapy;Electrical Stimulation;Moist Heat;Neuromuscular re-education;Therapeutic exercise;Stair training;Gait training;Patient/family education;Orthotic Fit/Training;Manual techniques;Scar mobilization;Taping;Joint Manipulations;Spinal Manipulations   ? PT Home Exercise Plan [email or web address]; [id number]   ? Consulted and Agree with Plan of Care Patient   ? ?  ?  ? ?  ? ? ?Patient  will benefit from skilled therapeutic intervention in order to improve the following deficits and impairments:  Abnormal gait, Decreased balance, Decreased endurance, Decreased mobility, Difficulty walking, Pain, Decreased strength, Decreased coordination, Decreased activity tolerance, Decreased scar mobility ? ?Visit Diagnosis: ?Difficulty in walking, not elsewhere classified ? ?Muscle weakness (generalized) ? ? ? ? ?Problem List ?Patient Active Problem List  ? Diagnosis Date Noted  ? Pernicious anemia 11/13/2021  ? History of cesarean delivery 09/14/2021  ? Encounter for sterilization 09/14/2021  ? [redacted] weeks gestation of pregnancy 09/14/2021  ? Back pain affecting pregnancy in third trimester 08/27/2021  ? Anemia   ? B12 deficiency 07/13/2021  ? Absolute anemia 07/13/2021  ? Thrombocytopenia affecting pregnancy, antepartum (HCC) 07/10/2021  ? Depressed mood 06/14/2021  ? Pelvic pain in pregnancy 06/14/2021  ?  History of cesarean delivery affecting pregnancy 06/14/2021  ? Prenatal care, subsequent pregnancy, second trimester 02/23/2021  ? Nausea/vomiting in pregnancy 02/23/2021  ? Supervision of high risk pregnancy, ante

## 2022-01-02 ENCOUNTER — Ambulatory Visit: Payer: Medicaid Other | Admitting: Physical Therapy

## 2022-01-02 ENCOUNTER — Other Ambulatory Visit: Payer: Self-pay

## 2022-01-02 ENCOUNTER — Encounter: Payer: Self-pay | Admitting: Physical Therapy

## 2022-01-02 DIAGNOSIS — R262 Difficulty in walking, not elsewhere classified: Secondary | ICD-10-CM

## 2022-01-02 DIAGNOSIS — M6281 Muscle weakness (generalized): Secondary | ICD-10-CM

## 2022-01-02 NOTE — Therapy (Signed)
[facility] ?[facility] ?[address]. [place], [place], [address] ?Phone: [phone]   Fax:  [phone] ? ?Physical Therapy Treatment ? ?Patient Details  ?Name: Nicole Hartman ?MRN: [id number] ?Date of Birth: 1996-06-09 ?Referring Provider (PT): Schuman, Christanna ? ? ?Encounter Date: 01/02/2022 ? ? PT End of Session - 01/02/22 1756   ? ? Visit Number 17   ? Number of Visits 26   ? Date for PT Re-Evaluation 01/30/22   ? PT Start Time 1420   ? PT Stop Time 1500   ? PT Time Calculation (min) 40 min   ? Activity Tolerance Patient tolerated treatment well   ? Behavior During Therapy [facility] for tasks assessed/performed   ? ?  ?  ? ?  ? ? ?Past Medical History:  ?Diagnosis Date  ? Anemia   ? Asthma   ? exercise induced  ? Cesarean delivery delivered 03/02/2016  ? Depression 2014  ? GERD (gastroesophageal reflux disease)   ? Headache   ? h/o migraines  ? PTSD (post-traumatic stress disorder) 2014  ? Thrombocytopenia ([place])   ? ? ?Past Surgical History:  ?Procedure Laterality Date  ? CESAREAN SECTION N/A 03/01/2016  ? Procedure: CESAREAN SECTION;  Surgeon: Will Bonnet, MD;  Location: [facility];  Service: Obstetrics;  Laterality: N/A;  ? CESAREAN SECTION N/A 02/11/2020  ? Procedure: CESAREAN SECTION;  Surgeon: Will Bonnet, MD;  Location: [facility];  Service: Obstetrics;  Laterality: N/A;  RNFA  ? CESAREAN SECTION WITH BILATERAL TUBAL LIGATION N/A 09/14/2021  ? Procedure: CESAREAN SECTION WITH BILATERAL TUBAL LIGATION;  Surgeon: Will Bonnet, MD;  Location: [facility];  Service: Obstetrics;  Laterality: N/A;  ? ? ?There were no vitals filed for this visit. ? ? Subjective Assessment - 01/02/22 1754   ? ? Subjective Patient presents to clinic with reports of increased B hip discomfort and feeling like hr R hip needs to pop. Patient notes that she would like to practice ambulating with a cane as she will have increased walking with son's upcomgin soccer season. Patient is still  awaiting scheduling of [place] for assessment of inguinal edema.   ? Currently in Pain? Yes   ? Pain Score 3    ? ?  ?  ? ?  ? ?TREATMENT ?Therapeutic Exercise: ?Sci-fit, seat 2, L0, for warm-up, during history taking, x5 min ?Total gym squats, hip width, x10.TCs for improved motor control ?Total gym squats sumo, x10 ?Total gym squats narrow stance with knee block, x10 ?Total gym press, SL, x10 with tactile cues to prevent medial knee collapse ?Gait training with SPC for improved gait mechanics and WB through RLE. ? ?Manual Therapy: ?LAD of RLE for improved hip mobility and decreased pain; Gentle oscillations with high velocity stretches combined.  ?SAD of RLE for improved hip mobility and decreased pain ?Lateral distraction of R hip for improved hip mobility and decreased pain ? ?Patient educated throughout session on appropriate technique and form using multi-modal cueing, HEP, and activity modification. Patient articulated understanding and returned demonstration. ? ?Patient Response to interventions: ?Notes less discomfort in R hip after manual ? ?ASSESSMENT ?Patient presents to clinic with excellent motivation to participate in therapy. Patient demonstrates deficits in RLE strength, gait, pain, and function. Patient with good coordination of gait with SPC during today's session and responded positively to manual interventions. Patient will benefit from continued skilled therapeutic intervention to address remaining deficits in RLE strength, gait, pain, and function in order to increase  function and improve overall QOL. ? ? ? PT Long Term Goals - 12/19/21 1601   ? ?  ? PT LONG TERM GOAL #1  ? Title Patient will be independent with HEP in order to improve strength and balance in order to decrease fall risk and improve function at home and in the community.   ? Baseline IE: not initiated; 2/7: IND   ? Time 6   ? Period Weeks   ? Status Achieved   ? Target Date 11/15/21   ?  ? PT LONG TERM GOAL #2  ? Title Patient will  decrease 5TSTS to < 12 seconds without UE support nor compensatory patterns in order to demonstrate clinically significant improvement in LE strength and to approach peer normative values.   ? Baseline IE: 63.4 sec (LLE bias); 2/7: intermittent UE support no bias 45.5 sec, LLE bias 20.5 sec; 3/8: 14.54 sec, UE support, no bias   ? Time 6   ? Period Weeks   ? Status On-going   ? Target Date 01/30/22   ?  ? PT LONG TERM GOAL #3  ? Title Patient will decrease TUG to below 14 seconds/decrease in order to demonstrate decreased fall risk.   ? Baseline IE: assess at next visit   ? Time 6   ? Period Weeks   ? Status Deferred   ?  ? PT LONG TERM GOAL #4  ? Title Patient will increase 6MWT by at least 83m (116ft) in order to demonstrate clinically significant improvement in cardiopulmonary endurance and community ambulation   ? Baseline IE: assess at next visit; 12/27: 2 min walk test: 56 ft, 5/10 pain (burning), RW, SBA, 2/7: deferred; 2/16: 538 ft, no AD, IND, 2/10 pain; 3/8: 784 ft, no AD, IND, 5/10 pain   ? Time 6   ? Period Weeks   ? Status On-going   ? Target Date 01/30/22   ?  ? PT LONG TERM GOAL #5  ? Title Patient will demonstrate improved function as evidenced by a score of 68 on FOTO measure for full participation in activities at home and in the community.   ? Baseline IE: 50; 1/30: 58; 3/8: 64   ? Time 6   ? Period Weeks   ? Status On-going   ? Target Date 01/30/22   ? ?  ?  ? ?  ? ? ? ? ? ? ? ? Plan - 01/02/22 1756   ? ? Clinical Impression Statement Patient presents to clinic with excellent motivation to participate in therapy. Patient demonstrates deficits in RLE strength, gait, pain, and function. Patient with good coordination of gait with SPC during today's session and responded positively to manual interventions. Patient will benefit from continued skilled therapeutic intervention to address remaining deficits in RLE strength, gait, pain, and function in order to increase function and improve overall QOL.    ? Personal Factors and Comorbidities Comorbidity 3+;Time since onset of injury/illness/exacerbation   ? Comorbidities anemia, asthma, depression, GERD, migraines, PTSD   ? Examination-Activity Limitations Bathing;Hygiene/Grooming;Toileting;Squat;Sleep;Transfers;Dressing;Stairs;Bend;Lift   ? Examination-Participation Restrictions Community Activity;Occupation;Driving;Meal Prep;Cleaning;Shop;Laundry   ? Stability/Clinical Decision Making Evolving/Moderate complexity   ? Rehab Potential Good   ? PT Frequency 1x / week   ? PT Duration 6 weeks   ? PT Treatment/Interventions ADLs/Self Care Home Management;Cryotherapy;Electrical Stimulation;Moist Heat;Neuromuscular re-education;Therapeutic exercise;Stair training;Gait training;Patient/family education;Orthotic Fit/Training;Manual techniques;Scar mobilization;Taping;Joint Manipulations;Spinal Manipulations   ? PT Home Exercise Plan [id number]; [id number]   ? Consulted and Agree with Plan of Care Patient   ? ?  ?  ? ?  ? ? ?  Patient will benefit from skilled therapeutic intervention in order to improve the following deficits and impairments:  Abnormal gait, Decreased balance, Decreased endurance, Decreased mobility, Difficulty walking, Pain, Decreased strength, Decreased coordination, Decreased activity tolerance, Decreased scar mobility ? ?Visit Diagnosis: ?Muscle weakness (generalized) ? ?Difficulty in walking, not elsewhere classified ? ? ? ? ?Problem List ?Patient Active Problem List  ? Diagnosis Date Noted  ? Pernicious anemia 11/13/2021  ? History of cesarean delivery 09/14/2021  ? Encounter for sterilization 09/14/2021  ? [redacted] weeks gestation of pregnancy 09/14/2021  ? Back pain affecting pregnancy in third trimester 08/27/2021  ? Anemia   ? B12 deficiency 07/13/2021  ? Absolute anemia 07/13/2021  ? Thrombocytopenia affecting pregnancy, antepartum ([place]) 07/10/2021  ? Depressed mood 06/14/2021  ? Pelvic pain in pregnancy 06/14/2021  ? History of cesarean delivery affecting  pregnancy 06/14/2021  ? Prenatal care, subsequent pregnancy, second trimester 02/23/2021  ? Nausea/vomiting in pregnancy 02/23/2021  ? Supervision of high risk pregnancy, antepartum 07/02/2019  ? PTSD

## 2022-01-07 NOTE — Progress Notes (Addendum)
? ?  CC:  postpartum leg weakness/numbness ? ?Follow-up Visit ? ?Last visit: 09/28/21 ? ?Brief HPI: ?26 year old female with a history of B12 deficiency who follows in clinic for back pain, and paresthesias/weakness in her right leg. ? ?At her last visit, MRI L-spine was ordered. ? ?Interval History: ?Since her last visit she has had some improvement in her weakness. She is working with physical therapy which has helped. Continues to have pain in her right groin and hip which can radiate down her right leg.  ? ?MRI L-spine was unremarkable. EMG was done, and was normal without evidence of neuropathy or radiculopathy. ? ?She also reports swelling of her RLE. An ultrasound of her RLE has been ordered by her OB to assess for DVT. ? ?Physical Exam:  ? ?Vital Signs: ?BP 106/62   Pulse 89   Ht 5' (1.524 m)   Wt 148 lb (67.1 kg)   SpO2 98%   BMI 28.90 kg/m?  ?GENERAL:  well appearing, in no acute distress, alert  ?SKIN:  Color, texture, turgor normal. No rashes or lesions ?HEAD:  Normocephalic/atraumatic. ?RESP: normal respiratory effort ?MSK:  No gross joint deformities.  ? ?NEUROLOGICAL: ?Mental Status: Alert, oriented to person, place and time, Follows commands, and Speech fluent and appropriate. ?Cranial Nerves: PERRL, face symmetric, no dysarthria, hearing grossly intact ?Motor: 5/5 strength bilateral hip flexion and extension, at least 4/5 strength right knee flexion and extension--possibly limited by pain ?Sensation: Diminished sensation to pinprick over anterior thigh, shin, and dorsum of right foot ?Gait: antalgic gait ? ?IMPRESSION: ?26 year old female with a history of B12 deficiency who presents for follow up of postpartum right leg pain and weakness. Her exam is suggestive of right femoral neuropathy, however neurologic testing including MRI lumbar spine and EMG have been normal without evidence of radiculopathy or neuropathy. Suspect her weakness is partially secondary to pain as she struggles to give  full effort on motor exam due to pain. Recommend she continue with physical therapy and follow up with family doctor regarding possible musculoskeletal causes of pain (reports significant right hip pain). She states her OB is currently her primary medical provider and will be leaving in a few weeks. Referral to Family Medicine placed to establish her with a PCP. ? ?PLAN: ?-Continue Physical Therapy ?-Referral to Family Medicine to establish with PCP ? ?Follow-up: as needed ? ?I spent a total of 31 minutes on the date of the service. Discussed medication side effects, adverse reactions and drug interactions. Written educational materials and patient instructions outlining all of the above were given. ? ?Genia Harold, MD ?01/08/22 ?9:35 AM ? ?

## 2022-01-08 ENCOUNTER — Ambulatory Visit: Payer: Medicaid Other | Admitting: Psychiatry

## 2022-01-08 VITALS — BP 106/62 | HR 89 | Ht 60.0 in | Wt 148.0 lb

## 2022-01-08 DIAGNOSIS — Z7689 Persons encountering health services in other specified circumstances: Secondary | ICD-10-CM | POA: Diagnosis not present

## 2022-01-08 DIAGNOSIS — M79604 Pain in right leg: Secondary | ICD-10-CM | POA: Diagnosis not present

## 2022-01-09 ENCOUNTER — Telehealth: Payer: Self-pay | Admitting: Psychiatry

## 2022-01-09 ENCOUNTER — Encounter: Payer: Self-pay | Admitting: Physical Therapy

## 2022-01-09 ENCOUNTER — Ambulatory Visit: Payer: Medicaid Other | Admitting: Physical Therapy

## 2022-01-09 DIAGNOSIS — M6281 Muscle weakness (generalized): Secondary | ICD-10-CM

## 2022-01-09 DIAGNOSIS — R262 Difficulty in walking, not elsewhere classified: Secondary | ICD-10-CM | POA: Diagnosis not present

## 2022-01-09 NOTE — Therapy (Signed)
?OUTPATIENT PHYSICAL THERAPY TREATMENT NOTE ? ? ?Patient Name: Nicole Hartman ?MRN: [id number] ?DOB:1995-11-30, 26 y.o., female ?Today's Date: 01/10/2022 ? ?[facility] ?REFERRING PROVIDER: Natale MilchSchuman, Christanna R, * ? ? PT End of Session - 01/09/22 1426   ? ? Visit Number 18   ? Number of Visits 26   ? Date for PT Re-Evaluation 01/30/22   ? PT Start Time 1415   ? PT Stop Time 1455   ? PT Time Calculation (min) 40 min   ? Activity Tolerance Patient tolerated treatment well   ? [facility] assessed/performed   ? ?  ?  ? ?  ? ? ?Past Medical History:  ?Diagnosis Date  ? Anemia   ? Asthma   ? exercise induced  ? Cesarean delivery delivered 03/02/2016  ? Depression 2014  ? GERD (gastroesophageal reflux disease)   ? Headache   ? h/o migraines  ? PTSD (post-traumatic stress disorder) 2014  ? Thrombocytopenia ([facility])   ? ?Past Surgical History:  ?Procedure Laterality Date  ? CESAREAN SECTION N/A 03/01/2016  ? Procedure: CESAREAN SECTION;  Surgeon: Conard NovakStephen D Jackson, MD;  Location: [facility];  Service: Obstetrics;  Laterality: N/A;  ? CESAREAN SECTION N/A 02/11/2020  ? Procedure: CESAREAN SECTION;  Surgeon: Conard NovakJackson, Stephen D, MD;  Location: [facility];  Service: Obstetrics;  Laterality: N/A;  RNFA  ? CESAREAN SECTION WITH BILATERAL TUBAL LIGATION N/A 09/14/2021  ? Procedure: CESAREAN SECTION WITH BILATERAL TUBAL LIGATION;  Surgeon: Conard NovakJackson, Stephen D, MD;  Location: [facility];  Service: Obstetrics;  Laterality: N/A;  ? ?Patient Active Problem List  ? Diagnosis Date Noted  ? Pernicious anemia 11/13/2021  ? History of cesarean delivery 09/14/2021  ? Encounter for sterilization 09/14/2021  ? [redacted] weeks gestation of pregnancy 09/14/2021  ? Back pain affecting pregnancy in third trimester 08/27/2021  ? Anemia   ? B12 deficiency 07/13/2021  ? Absolute anemia 07/13/2021  ? Thrombocytopenia affecting pregnancy, antepartum ([facility]) 07/10/2021  ? Depressed mood 06/14/2021  ? Pelvic pain in  pregnancy 06/14/2021  ? History of cesarean delivery affecting pregnancy 06/14/2021  ? Prenatal care, subsequent pregnancy, second trimester 02/23/2021  ? Nausea/vomiting in pregnancy 02/23/2021  ? Supervision of high risk pregnancy, antepartum 07/02/2019  ? PTSD (post-traumatic stress disorder) 01/08/2013  ? MDD (major depressive disorder), recurrent episode, severe ([facility]) 01/08/2013  ? ? ?REFERRING DIAG: T14.8XXA (ICD-10-CM) - Nerve injury ?R26.2 (ICD-10-CM) - Difficulty walking ? ?THERAPY DIAG:  ?Muscle weakness (generalized) ? ?Difficulty in walking, not elsewhere classified ? ?PERTINENT HISTORY: 09/14/2021 c-section with resulting nerve injury and difficulty walking ? ?PRECAUTIONS: None ? ?SUBJECTIVE: Patient presents to clinic after neurology f/u. Patient frustrated with f/u and feeling dismissed by neurology. Patient continues to have weakness and RLE and has increased pain and numbness with increased activity. Neurology suggested to patient that the pain was related to her hip joint, so she is interested in reassessment of hip joint. Patient has had good success ambulating with SPC and finds that she can have more normal gait with cane.  ? ?PAIN:  ?Are you having pain? Yes: NPRS scale: 5/10 ?TREATMENT ?Pre-treatment assessment: ?Sensory testing of BLE L2-S2: patient has decreased sensation along L4 dermatome on R.  ?FABER: Negative B ?FADDIR: Negative B ?Hip Scour: Negative B ?Femoral Nerve Tension test: Positive on R ?Functional muscle testing of BLE reveals significant deficits in R knee extension further suggesting involvement of L4.  ? ?Therapeutic Exercise: ?Sci-fit, seat 2, L0, for warm-up, during  history taking, x5 min ?Total gym squats, hip width, x10.TCs for improved motor control ?Total gym squats sumo, x10 ?Total gym squats narrow stance, x10 ? ?Patient educated throughout session on appropriate technique and form using multi-modal cueing, HEP, and activity modification. Patient articulated  understanding and returned demonstration. ? ?Patient Response to interventions: ?No increased discomfort.  ? ?ASSESSMENT ?Patient presents to clinic with excellent motivation to participate in therapy. Patient demonstrates deficits in RLE strength, gait, pain, and function. On reassessment of BLE, patient does not present with any indication for hip joint involvement as suggested by neurology. However, patient does continue to have strength deficits and sensory impairments in a distinct dermatomal and myotomal pattern that suggests some involvement of L4. Additionally, patient has a positive R femoral nerve tension test; again based on the contributing nerve roots to the femoral nerve (L2-4), this continues to suggest some involvement from L4. Patient is requiring fewer tactile cues in order to achieve R knee extension, however she still has significant deficits in motor control with visible muscle fasciculations and lack of eccentric control on RLE step down test. Patient and DPT discussed the prolonged nature of nerve healing and both agree on the importance of continued diligence with HEP. Patient will benefit from continued skilled therapeutic intervention to address remaining deficits in RLE strength, gait, pain, and function in order to increase function and improve overall QOL. ? ? PT Long Term Goals   ? ?  ? PT LONG TERM GOAL #1  ? Title Patient will be independent with HEP in order to improve strength and balance in order to decrease fall risk and improve function at home and in the community.   ? Baseline IE: not initiated; 2/7: IND   ? Time 6   ? Period Weeks   ? Status Achieved   ? Target Date 11/15/21   ?  ? PT LONG TERM GOAL #2  ? Title Patient will decrease 5TSTS to < 12 seconds without UE support nor compensatory patterns in order to demonstrate clinically significant improvement in LE strength and to approach peer normative values.   ? Baseline IE: 63.4 sec (LLE bias); 2/7: intermittent UE support  no bias 45.5 sec, LLE bias 20.5 sec; 3/8: 14.54 sec, UE support, no bias   ? Time 6   ? Period Weeks   ? Status On-going   ? Target Date 01/30/22   ?  ? PT LONG TERM GOAL #3  ? Title Patient will decrease TUG to below 14 seconds/decrease in order to demonstrate decreased fall risk.   ? Baseline IE: assess at next visit   ? Time 6   ? Period Weeks   ? Status Deferred   ?  ? PT LONG TERM GOAL #4  ? Title Patient will increase by at least 54m (162ft) in order to demonstrate clinically significant improvement in cardiopulmonary endurance and community ambulation   ? Baseline IE: assess at next visit; 12/27: 2 min walk test: 56 ft, 5/10 pain (burning), RW, SBA, 2/7: deferred; 2/16: 538 ft, no AD, IND, 2/10 pain; 3/8: 784 ft, no AD, IND, 5/10 pain   ? Time 6   ? Period Weeks   ? Status On-going   ? Target Date 01/30/22   ?  ? PT LONG TERM GOAL #5  ? Title Patient will demonstrate improved function as evidenced by a score of 68 on FOTO measure for full participation in activities at home and in the community.   ? Baseline  IE: 50; 1/30: 58; 3/8: 64   ? Time 6   ? Period Weeks   ? Status On-going   ? Target Date 01/30/22   ? ?  ?  ? ?  ? ? Plan   ? ? Clinical Impression Statement Patient presents to clinic with excellent motivation to participate in therapy. Patient demonstrates deficits in RLE strength, gait, pain, and function. On reassessment of BLE, patient does not present with any indication for hip joint involvement as suggested by neurology. However, patient does continue to have strength deficits and sensory impairments in a distinct dermatomal and myotomal pattern that suggests some involvement of L4. Additionally, patient has a positive R femoral nerve tension test; again based on the contributing nerve roots to the femoral nerve (L2-4), this continues to suggest some involvement from L4. Patient is requiring fewer tactile cues in order to achieve R knee extension, however she still has significant deficits  in motor control with visible muscle fasciculations and lack of eccentric control on RLE step down test. Patient and DPT discussed the prolonged nature of nerve healing and both agree on the importance of continu

## 2022-01-09 NOTE — Telephone Encounter (Signed)
Referral sent to [facility] [phone]. ?

## 2022-01-12 ENCOUNTER — Encounter: Payer: Self-pay | Admitting: Oncology

## 2022-01-16 ENCOUNTER — Ambulatory Visit
Admission: RE | Admit: 2022-01-16 | Discharge: 2022-01-16 | Disposition: A | Discharge status: Home / Self Care | Payer: Medicaid Other | Source: Ambulatory Visit | Attending: Obstetrics and Gynecology | Admitting: Obstetrics and Gynecology

## 2022-01-16 ENCOUNTER — Other Ambulatory Visit: Payer: Self-pay

## 2022-01-16 DIAGNOSIS — M7989 Other specified soft tissue disorders: Secondary | ICD-10-CM | POA: Diagnosis not present

## 2022-01-16 DIAGNOSIS — M79604 Pain in right leg: Secondary | ICD-10-CM | POA: Diagnosis not present

## 2022-01-22 ENCOUNTER — Ambulatory Visit
Admission: EM | Admit: 2022-01-22 | Discharge: 2022-01-22 | Disposition: A | Discharge status: Home / Self Care | Payer: Medicaid Other | Attending: Physician Assistant | Admitting: Physician Assistant

## 2022-01-22 ENCOUNTER — Other Ambulatory Visit: Payer: Self-pay

## 2022-01-22 DIAGNOSIS — J020 Streptococcal pharyngitis: Secondary | ICD-10-CM | POA: Insufficient documentation

## 2022-01-22 DIAGNOSIS — R0981 Nasal congestion: Secondary | ICD-10-CM | POA: Insufficient documentation

## 2022-01-22 DIAGNOSIS — J029 Acute pharyngitis, unspecified: Secondary | ICD-10-CM | POA: Diagnosis not present

## 2022-01-22 LAB — GROUP A STREP BY PCR: Group A Strep by PCR: DETECTED — AB

## 2022-01-22 MED ORDER — IPRATROPIUM BROMIDE 0.06 % NA SOLN: 2.0000 | Freq: Four times a day (QID) | NASAL | 0 refills | Status: DC

## 2022-01-22 MED ORDER — LIDOCAINE VISCOUS HCL 2 % MT SOLN: 15.0000 mL | OROMUCOSAL | 0 refills | Status: DC | PRN

## 2022-01-22 MED ORDER — AMOXICILLIN 500 MG PO CAPS: 500.0000 mg | ORAL_CAPSULE | Freq: Two times a day (BID) | ORAL | 0 refills | Status: DC

## 2022-01-22 NOTE — ED Provider Notes (Signed)
?[facility] ? ? ? ?CSN: [id number] ?Arrival date & time: 01/22/22  1634 ? ? ?  ? ?History   ?Chief Complaint ?Chief Complaint  ?Patient presents with  ? Ear Problem  ? ? ?HPI ?Nicole Hartman is a 26 y.o. female presenting for approximately 1 week history of bilateral ear discomfort but more pain on the right side, sore throat and congestion.  Patient denies associated fever, fatigue, aches, chest pain, breathe difficulty, vomiting or diarrhea.  Reports she has tried Sudafed and allergy medicine and it seems to have helped somewhat but the ear pain does seem little worse.  Patient would like to be assessed for possible ear infection.  Patient says her child was sick with a cold last week.  No known exposure to COVID-19 or strep reported.  History of asthma, allergies, migraines.  No other complaints. ? ?HPI ? ?Past Medical History:  ?Diagnosis Date  ? Anemia   ? Asthma   ? exercise induced  ? Cesarean delivery delivered 03/02/2016  ? Depression 2014  ? GERD (gastroesophageal reflux disease)   ? Headache   ? h/o migraines  ? PTSD (post-traumatic stress disorder) 2014  ? Thrombocytopenia (HCC)   ? ? ?Patient Active Problem List  ? Diagnosis Date Noted  ? Pernicious anemia 11/13/2021  ? History of cesarean delivery 09/14/2021  ? Encounter for sterilization 09/14/2021  ? [redacted] weeks gestation of pregnancy 09/14/2021  ? Back pain affecting pregnancy in third trimester 08/27/2021  ? Anemia   ? B12 deficiency 07/13/2021  ? Absolute anemia 07/13/2021  ? Thrombocytopenia affecting pregnancy, antepartum (HCC) 07/10/2021  ? Depressed mood 06/14/2021  ? Pelvic pain in pregnancy 06/14/2021  ? History of cesarean delivery affecting pregnancy 06/14/2021  ? Prenatal care, subsequent pregnancy, second trimester 02/23/2021  ? Nausea/vomiting in pregnancy 02/23/2021  ? Supervision of high risk pregnancy, antepartum 07/02/2019  ? PTSD (post-traumatic stress disorder) 01/08/2013  ? MDD (major depressive disorder), recurrent  episode, severe (HCC) 01/08/2013  ? ? ?Past Surgical History:  ?Procedure Laterality Date  ? CESAREAN SECTION N/A 03/01/2016  ? Procedure: CESAREAN SECTION;  Surgeon: Conard NovakStephen D Jackson, MD;  Location: [facility];  Service: Obstetrics;  Laterality: N/A;  ? CESAREAN SECTION N/A 02/11/2020  ? Procedure: CESAREAN SECTION;  Surgeon: Conard NovakJackson, Stephen D, MD;  Location: [facility];  Service: Obstetrics;  Laterality: N/A;  RNFA  ? CESAREAN SECTION WITH BILATERAL TUBAL LIGATION N/A 09/14/2021  ? Procedure: CESAREAN SECTION WITH BILATERAL TUBAL LIGATION;  Surgeon: Conard NovakJackson, Stephen D, MD;  Location: [facility];  Service: Obstetrics;  Laterality: N/A;  ? ? ?OB History   ? ? Gravida  ?3  ? Para  ?3  ? Term  ?3  ? Preterm  ?   ? AB  ?   ? Living  ?3  ?  ? ? SAB  ?   ? IAB  ?   ? Ectopic  ?   ? Multiple  ?0  ? Live Births  ?3  ?   ?  ?  ? ? ? ?Home Medications   ? ?Prior to Admission medications   ?Medication Sig Start Date End Date Taking? Authorizing Provider  ?albuterol (VENTOLIN HFA) 108 (90 Base) MCG/ACT inhaler Inhale 2 puffs into the lungs 3 (three) times daily as needed for wheezing (for cough).   Yes [provider]  ?amoxicillin (AMOXIL) 500 MG capsule Take 1 capsule (500 mg total) by mouth 2 (two) times daily for 10 days. 01/22/22 02/01/22 Yes Eusebio FriendlyEaves, Donique Hammonds  B, PA-C  ?Cobalamin Combinations (B-12) [phone] MCG SUBL  11/09/21  Yes [provider]  ?Ferrous Sulfate (IRON PO) Take 1 tablet by mouth daily.   Yes [provider]  ?ipratropium (ATROVENT) 0.06 % nasal spray Place 2 sprays into both nostrils 4 (four) times daily. 01/22/22  Yes Eusebio Friendly B, PA-C  ?lidocaine (XYLOCAINE) 2 % solution Use as directed 15 mLs in the mouth or throat every 3 (three) hours as needed for mouth pain (swish and spit). 01/22/22  Yes Shirlee Latch, PA-C  ?Prenatal Vit-Fe Fumarate-FA (PNV PRENATAL PLUS MULTIVITAMIN) 27-1 MG TABS Take 1 tablet by mouth daily.   Yes [provider]  ?calcium carbonate (TUMS - DOSED IN  MG ELEMENTAL CALCIUM) 500 MG chewable tablet Chew 2 tablets (400 mg of elemental calcium total) by mouth daily as needed for indigestion or heartburn. ?Patient not taking: Reported on 12/26/2021 09/17/21   Vena Austria, MD  ? ? ?Family History ?Family History  ?Problem Relation Age of Onset  ? Healthy Mother   ? Healthy Father   ? Cancer - Colon Maternal Grandfather   ? Breast cancer Paternal Grandmother   ?     58s  ? Breast cancer Maternal Aunt   ?     not sure of age  ? ? ?Social History ?Social History  ? ?Tobacco Use  ? Smoking status: Never  ? Smokeless tobacco: Never  ?Vaping Use  ? Vaping Use: Never used  ?Substance Use Topics  ? Alcohol use: No  ? Drug use: No  ? ? ? ?Allergies   ?Patient has no known allergies. ? ? ?Review of Systems ?Review of Systems  ?Constitutional:  Negative for chills, diaphoresis, fatigue and fever.  ?HENT:  Positive for congestion, ear pain, rhinorrhea and sore throat. Negative for sinus pressure and sinus pain.   ?Respiratory:  Positive for cough. Negative for shortness of breath.   ?Gastrointestinal:  Negative for abdominal pain, nausea and vomiting.  ?Musculoskeletal:  Negative for arthralgias and myalgias.  ?Skin:  Negative for rash.  ?Neurological:  Negative for weakness and headaches.  ?Hematological:  Negative for adenopathy.  ? ? ?Physical Exam ?Triage Vital Signs ?ED Triage Vitals  ?Enc Vitals Group  ?   BP 01/22/22 1704 (!) 136/102  ?   Pulse Rate 01/22/22 1704 79  ?   Resp 01/22/22 1704 18  ?   Temp 01/22/22 1704 98.3 ?F (36.8 ?C)  ?   Temp Source 01/22/22 1704 Oral  ?   SpO2 01/22/22 1704 100 %  ?   Weight 01/22/22 1701 130 lb (59 kg)  ?   Height 01/22/22 1701 5' (1.524 m)  ?   Head Circumference --   ?   Peak Flow --   ?   Pain Score 01/22/22 1700 8  ?   Pain Loc --   ?   Pain Edu? --   ?   Excl. in GC? --   ? ?No data found. ? ?Updated Vital Signs ?BP (!) 136/102 (BP Location: Left Arm)   Pulse 79   Temp 98.3 ?F (36.8 ?C) (Oral)   Resp 18   Ht 5' (1.524 m)    Wt 130 lb (59 kg)   LMP  (LMP Unknown)   SpO2 100%   BMI 25.39 kg/m?  ? ?  ? ?Physical Exam ?Vitals and nursing note reviewed.  ?Constitutional:   ?   General: She is not in acute distress. ?   Appearance: Normal appearance.  She is not ill-appearing or toxic-appearing.  ?HENT:  ?   Head: Normocephalic and atraumatic.  ?   Right Ear: Ear canal and external ear normal. A middle ear effusion is present. Tympanic membrane is not erythematous or bulging.  ?   Left Ear: Tympanic membrane, ear canal and external ear normal.  No middle ear effusion. Tympanic membrane is not erythematous or bulging.  ?   Nose: Congestion present.  ?   Mouth/Throat:  ?   Mouth: Mucous membranes are moist.  ?   Pharynx: Oropharynx is clear. Posterior oropharyngeal erythema present.  ?Eyes:  ?   General: No scleral icterus.    ?   Right eye: No discharge.     ?   Left eye: No discharge.  ?   Conjunctiva/sclera: Conjunctivae normal.  ?Cardiovascular:  ?   Rate and Rhythm: Normal rate and regular rhythm.  ?   Heart sounds: Normal heart sounds.  ?Pulmonary:  ?   Effort: Pulmonary effort is normal. No respiratory distress.  ?   Breath sounds: Normal breath sounds.  ?Musculoskeletal:  ?   Cervical back: Neck supple.  ?Lymphadenopathy:  ?   Cervical: Cervical adenopathy present.  ?Skin: ?   General: Skin is dry.  ?Neurological:  ?   General: No focal deficit present.  ?   Mental Status: She is alert. Mental status is at baseline.  ?   Motor: No weakness.  ?   Gait: Gait normal.  ?Psychiatric:     ?   Mood and Affect: Mood normal.     ?   Behavior: Behavior normal.     ?   Thought Content: Thought content normal.  ? ? ? ?UC Treatments / Results  ?Labs ?(all labs ordered are listed, but only abnormal results are displayed) ?Labs Reviewed  ?GROUP A STREP BY PCR - Abnormal; Notable for the following components:  ?    Result Value  ? Group A Strep by PCR DETECTED (*)   ? All other components within normal limits  ? ? ?EKG ? ? ?Radiology ?No results  found. ? ?Procedures ?Procedures (including critical care time) ? ?Medications Ordered in UC ?Medications - No data to display ? ?Initial Impression / Assessment and Plan / UC Course  ?I have reviewed the triage v

## 2022-01-22 NOTE — Discharge Instructions (Addendum)
-  We are testing for strep.  I will call if the strep is positive and send antibiotics. ?- You do not have an ear infection but you do have fluid behind your eardrums which is likely what is causing your pain.  As we discussed, it can take some time for the fluid to dry up and the pain to completely go away.  Start taking Allegra-D.  You have to ask the pharmacy staff for this medication.  It is also fine to continue with the Sudafed and add ibuprofen too.  I have sent a nasal spray to the pharmacy for you and viscous lidocaine to help with your throat pain.  Increase rest and fluids. ?- As we discussed you may need to take this medication over the next week. ?

## 2022-01-22 NOTE — ED Triage Notes (Signed)
Pt c/o right ear pain x1week and sore throat x3days.  ? ?Pt is worried about a possible ear infection.  ?

## 2022-01-23 ENCOUNTER — Other Ambulatory Visit: Payer: Self-pay

## 2022-01-23 ENCOUNTER — Encounter: Payer: Self-pay | Admitting: Gastroenterology

## 2022-01-23 ENCOUNTER — Ambulatory Visit (INDEPENDENT_AMBULATORY_CARE_PROVIDER_SITE_OTHER): Payer: Medicaid Other | Admitting: Gastroenterology

## 2022-01-23 VITALS — BP 115/72 | HR 71 | Temp 98.2°F | Ht 60.0 in | Wt 130.0 lb

## 2022-01-23 DIAGNOSIS — D510 Vitamin B12 deficiency anemia due to intrinsic factor deficiency: Secondary | ICD-10-CM | POA: Diagnosis not present

## 2022-01-23 DIAGNOSIS — K625 Hemorrhage of anus and rectum: Secondary | ICD-10-CM

## 2022-01-23 DIAGNOSIS — K5904 Chronic idiopathic constipation: Secondary | ICD-10-CM | POA: Diagnosis not present

## 2022-01-23 DIAGNOSIS — K601 Chronic anal fissure: Secondary | ICD-10-CM

## 2022-01-23 MED ORDER — NA SULFATE-K SULFATE-MG SULF 17.5-3.13-1.6 GM/177ML PO SOLN
354.0000 mL | Freq: Once | ORAL | 0 refills | Status: AC
Start: 2022-01-23 — End: 2022-01-23

## 2022-01-23 NOTE — Progress Notes (Signed)
?  ?Cephas Darby, MD ?[address]  ?[address]  ?[place], [place] [address]  ?Main: [phone]  ?Fax: [phone] ? ? ? ?Gastroenterology Consultation ? ?Referring Provider:     Earlie Server, MD ?Primary Care Physician:  [facility] ?Primary Gastroenterologist:  Dr. Cephas Darby ?Reason for Consultation:     Pernicious anemia ?      ? HPI:   ?Nicole Hartman is a 26 y.o. female referred by [facility]  for consultation & management of pernicious anemia.  Patient has history of chronic iron deficiency anemia, also has severe B12 deficiency anemia, found to have elevated intrinsic factor and antiparietal cell antibodies based on the labs from January 2023.  Patient reports that she has been anemic as a child and worsened during her pregnancies.  She has a 44-month-old infant at home.  She received iron infusions and B12 replacement.  Currently, her iron and B12 deficiency anemia have resolved.  Most recent labs revealed hemoglobin 12.7, normal MCV, normal platelets, ferritin 48, B12 549.  Patient also had history of menorrhagia. ? ?Patient also reports irregular bowel habits, abdominal bloating and sometimes rectal bleeding on wiping.  She does report to severe rectal pain during defecation ? ?NSAIDs: None ? ?Antiplts/Anticoagulants/Anti thrombotics: None ? ?GI Procedures: None ?She denies family history of GI malignancy ? ?Past Medical History:  ?Diagnosis Date  ? Anemia   ? Asthma   ? exercise induced  ? Cesarean delivery delivered 03/02/2016  ? Depression 2014  ? GERD (gastroesophageal reflux disease)   ? Headache   ? h/o migraines  ? PTSD (post-traumatic stress disorder) 2014  ? Thrombocytopenia ([place])   ? ? ?Past Surgical History:  ?Procedure Laterality Date  ? CESAREAN SECTION N/A 03/01/2016  ? Procedure: CESAREAN SECTION;  Surgeon: Will Bonnet, MD;  Location: [facility];  Service: Obstetrics;  Laterality: N/A;  ? CESAREAN SECTION N/A 02/11/2020  ?  Procedure: CESAREAN SECTION;  Surgeon: Will Bonnet, MD;  Location: [facility];  Service: Obstetrics;  Laterality: N/A;  RNFA  ? CESAREAN SECTION WITH BILATERAL TUBAL LIGATION N/A 09/14/2021  ? Procedure: CESAREAN SECTION WITH BILATERAL TUBAL LIGATION;  Surgeon: Will Bonnet, MD;  Location: [facility];  Service: Obstetrics;  Laterality: N/A;  ? ?Current Outpatient Medications:  ?  albuterol (VENTOLIN HFA) 108 (90 Base) MCG/ACT inhaler, Inhale 2 puffs into the lungs 3 (three) times daily as needed for wheezing (for cough)., Disp: , Rfl:  ?  Cobalamin Combinations (B-12) [phone] MCG SUBL, , Disp: , Rfl:  ?  Ferrous Sulfate (IRON PO), Take 1 tablet by mouth daily., Disp: , Rfl:  ?  ipratropium (ATROVENT) 0.06 % nasal spray, Place 2 sprays into both nostrils 4 (four) times daily., Disp: 15 mL, Rfl: 0 ?  lidocaine (XYLOCAINE) 2 % solution, Use as directed 15 mLs in the mouth or throat every 3 (three) hours as needed for mouth pain (swish and spit)., Disp: 100 mL, Rfl: 0 ?  Na Sulfate-K Sulfate-Mg Sulf 17.5-3.13-1.6 GM/177ML SOLN, Take 354 mLs by mouth once for 1 dose., Disp: 354 mL, Rfl: 0 ?  calcium carbonate (TUMS - DOSED IN MG ELEMENTAL CALCIUM) 500 MG chewable tablet, Chew 2 tablets (400 mg of elemental calcium total) by mouth daily as needed for indigestion or heartburn. (Patient not taking: Reported on 12/26/2021), Disp: , Rfl:  ?  Prenatal Vit-Fe Fumarate-FA (PNV PRENATAL PLUS MULTIVITAMIN) 27-1 MG TABS, Take 1 tablet by mouth daily., Disp: , Rfl:  ? ? ? ?  Family History  ?Problem Relation Age of Onset  ? Healthy Mother   ? Healthy Father   ? Cancer - Colon Maternal Grandfather   ? Breast cancer Paternal Grandmother   ?     36s  ? Breast cancer Maternal Aunt   ?     not sure of age  ?  ? ?Social History  ? ?Tobacco Use  ? Smoking status: Never  ? Smokeless tobacco: Never  ?Vaping Use  ? Vaping Use: Never used  ?Substance Use Topics  ? Alcohol use: No  ? Drug use: No  ? ? ?Allergies as of 01/23/2022  ? (No  Known Allergies)  ? ? ?Review of Systems:    ?All systems reviewed and negative except where noted in HPI. ? ? Physical Exam:  ?BP 115/72 (BP Location: Left Arm, Patient Position: Sitting, Cuff Size: Normal)   Pulse 71   Temp 98.2 ?F (36.8 ?C) (Oral)   Ht 5' (1.524 m)   Wt 130 lb (59 kg)   LMP  (LMP Unknown)   Breastfeeding Yes   BMI 25.39 kg/m?  ?No LMP recorded (lmp unknown). ? ?General:   Alert,  Well-developed, well-nourished, pleasant and cooperative in NAD ?Head:  Normocephalic and atraumatic. ?Eyes:  Sclera clear, no icterus.   Conjunctiva pink. ?Ears:  Normal auditory acuity. ?Nose:  No deformity, discharge, or lesions. ?Mouth:  No deformity or lesions,oropharynx pink & moist. ?Neck:  Supple; no masses or thyromegaly. ?Lungs:  Respirations even and unlabored.  Clear throughout to auscultation.   No wheezes, crackles, or rhonchi. No acute distress. ?Heart:  Regular rate and rhythm; no murmurs, clicks, rubs, or gallops. ?Abdomen:  Normal bowel sounds. Soft, non-tender and non-distended without masses, hepatosplenomegaly or hernias noted.  No guarding or rebound tenderness.   ?Rectal: Normal perianal skin, point tenderness in the posterior wall of the anal canal ?Msk:  Symmetrical without gross deformities. Good, equal movement & strength bilaterally. ?Pulses:  Normal pulses noted. ?Extremities:  No clubbing or edema.  No cyanosis. ?Neurologic:  Alert and oriented x3;  grossly normal neurologically. ?Skin:  Intact without significant lesions or rashes. No jaundice. ?Psych:  Alert and cooperative. Normal mood and affect. ? ?Imaging Studies: ?No abdominal imaging ? ?Assessment and Plan:  ? ?Nicole Hartman is a 26 y.o. pleasant female with history of chronic constipation with abdominal bloating, chronic iron and B12 deficiency anemia secondary to pernicious anemia ? ?Pernicious anemia ?Currently, anemia has resolved with normal iron and B12 levels ?Recommend EGD with gastric biopsies to rule out any  metaplasia/dysplasia, H. pylori infection ? ?Chronic constipation ?Discussed about high-fiber diet, information provided ?Advised to start MiraLAX daily, samples provided ? ?Chronic posterior anal fissure ?Recommend 0.125% nitroglycerin with 5% lidocaine, instructions provided ? ?Rectal bleeding ?Recommend diagnostic colonoscopy ? ? ?Follow up in 4 months ? ? ?Cephas Darby, MD ? ?

## 2022-01-23 NOTE — Patient Instructions (Addendum)
[facility] [address], [place] [address]. Phone number [phone]. Please allow at least 24 hours before picking up the compounded cream because the pharmacy has to make the medication.    High-Fiber Eating Plan Fiber, also called dietary fiber, is a type of carbohydrate. It is found foods such as fruits, vegetables, whole grains, and beans. A high-fiber diet can have many health benefits. Your health care provider may recommend a high-fiber diet to help: Prevent constipation. Fiber can make your bowel movements more regular. Lower your cholesterol. Relieve the following conditions: Inflammation of veins in the anus (hemorrhoids). Inflammation of specific areas of the digestive tract (uncomplicated diverticulosis). A problem of the large intestine, also called the colon, that sometimes causes pain and diarrhea (irritable bowel syndrome, or IBS). Prevent overeating as part of a weight-loss plan. Prevent heart disease, type 2 diabetes, and certain cancers. What are tips for following this plan? Reading food labels  Check the nutrition facts label on food products for the amount of dietary fiber. Choose foods that have 5 grams of fiber or more per serving. The goals for recommended daily fiber intake include: Men (age 50 or younger): 34-38 g. Men (over age 50): 28-34 g. Women (age 50 or younger): 25-28 g. Women (over age 50): 22-25 g. Your daily fiber goal is _____________ g. Shopping Choose whole fruits and vegetables instead of processed forms, such as apple juice or applesauce. Choose a wide variety of high-fiber foods such as avocados, lentils, oats, and kidney beans. Read the nutrition facts label of the foods you choose. Be aware of foods with added fiber. These foods often have high sugar and sodium amounts per serving. Cooking Use whole-grain flour for baking and cooking. Cook with brown rice instead of white rice. Meal planning Start the day with a breakfast  that is high in fiber, such as a cereal that contains 5 g of fiber or more per serving. Eat breads and cereals that are made with whole-grain flour instead of refined flour or white flour. Eat brown rice, bulgur wheat, or millet instead of white rice. Use beans in place of meat in soups, salads, and pasta dishes. Be sure that half of the grains you eat each day are whole grains. General information You can get the recommended daily intake of dietary fiber by: Eating a variety of fruits, vegetables, grains, nuts, and beans. Taking a fiber supplement if you are not able to take in enough fiber in your diet. It is better to get fiber through food than from a supplement. Gradually increase how much fiber you consume. If you increase your intake of dietary fiber too quickly, you may have bloating, cramping, or gas. Drink plenty of water to help you digest fiber. Choose high-fiber snacks, such as berries, raw vegetables, nuts, and popcorn. What foods should I eat? Fruits Berries. Pears. Apples. Oranges. Avocado. Prunes and raisins. Dried figs. Vegetables Sweet potatoes. Spinach. Kale. Artichokes. Cabbage. Broccoli. Cauliflower. Green peas. Carrots. Squash. Grains Whole-grain breads. Multigrain cereal. Oats and oatmeal. Brown rice. Barley. Bulgur wheat. Millet. Quinoa. Bran muffins. Popcorn. Rye wafer crackers. Meats and other proteins Navy beans, kidney beans, and pinto beans. Soybeans. Split peas. Lentils. Nuts and seeds. Dairy Fiber-fortified yogurt. Beverages Fiber-fortified soy milk. Fiber-fortified orange juice. Other foods Fiber bars. The items listed above may not be a complete list of recommended foods and beverages. Contact a dietitian for more information. What foods should I avoid? Fruits Fruit juice. Cooked, strained fruit. Vegetables Fried potatoes.   Canned vegetables. Well-cooked vegetables. Grains White bread. Pasta made with refined flour. White rice. Meats and other  proteins Fatty cuts of meat. Fried chicken or fried fish. Dairy Milk. Yogurt. Cream cheese. Sour cream. Fats and oils Butters. Beverages Soft drinks. Other foods Cakes and pastries. The items listed above may not be a complete list of foods and beverages to avoid. Talk with your dietitian about what choices are best for you. Summary Fiber is a type of carbohydrate. It is found in foods such as fruits, vegetables, whole grains, and beans. A high-fiber diet has many benefits. It can help to prevent constipation, lower blood cholesterol, aid weight loss, and reduce your risk of heart disease, diabetes, and certain cancers. Increase your intake of fiber gradually. Increasing fiber too quickly may cause cramping, bloating, and gas. Drink plenty of water while you increase the amount of fiber you consume. The best sources of fiber include whole fruits and vegetables, whole grains, nuts, seeds, and beans. This information is not intended to replace advice given to you by your health care provider. Make sure you discuss any questions you have with your health care provider. Document Revised: 02/03/2020 Document Reviewed: 02/03/2020 Elsevier Patient Education  2022 [employer] Inc[employer]  

## 2022-01-25 ENCOUNTER — Telehealth: Payer: Self-pay | Admitting: Gastroenterology

## 2022-01-25 ENCOUNTER — Ambulatory Visit: Payer: Medicaid Other | Admitting: Physical Therapy

## 2022-01-25 NOTE — Telephone Encounter (Signed)
Pt calls regarding procedures sch for 02/07/22, pt wants to reschedule for 02/14/2022 if at all possible, pls call and advise is she can do this ?[phone] ?

## 2022-01-25 NOTE — Telephone Encounter (Signed)
Called patient back and informed patient that this would be at [facility] instead [facility] she states that is fine. She states she does not need new instructions  ?

## 2022-01-28 ENCOUNTER — Ambulatory Visit: Payer: Medicaid Other | Attending: Obstetrics and Gynecology | Admitting: Physical Therapy

## 2022-01-28 ENCOUNTER — Encounter: Payer: Self-pay | Admitting: Physical Therapy

## 2022-01-28 ENCOUNTER — Encounter: Payer: Self-pay | Admitting: Oncology

## 2022-01-28 DIAGNOSIS — R262 Difficulty in walking, not elsewhere classified: Secondary | ICD-10-CM | POA: Diagnosis not present

## 2022-01-28 DIAGNOSIS — M6281 Muscle weakness (generalized): Secondary | ICD-10-CM | POA: Diagnosis not present

## 2022-01-28 NOTE — Therapy (Signed)
?OUTPATIENT PHYSICAL THERAPY TREATMENT NOTE ? ? ?Patient Name: Nicole Hartman ?MRN: [id number] ?DOB:03-Jan-1996, 26 y.o., female ?Today's Date: 01/28/2022 ? ?[facility] ?REFERRING PROVIDER: Natale MilchSchuman, Christanna R, * ? ? PT End of Session - 01/28/22 1434   ? ? Visit Number 19   ? Number of Visits 26   ? Date for PT Re-Evaluation 01/30/22   ? PT Start Time 1415   ? PT Stop Time 1455   ? PT Time Calculation (min) 40 min   ? Activity Tolerance Patient tolerated treatment well   ? [facility] for tasks assessed/performed   ? ?  ?  ? ?  ? ? ?Past Medical History:  ?Diagnosis Date  ? Anemia   ? Asthma   ? exercise induced  ? Cesarean delivery delivered 03/02/2016  ? Depression 2014  ? GERD (gastroesophageal reflux disease)   ? Headache   ? h/o migraines  ? PTSD (post-traumatic stress disorder) 2014  ? Thrombocytopenia (HCC)   ? ?Past Surgical History:  ?Procedure Laterality Date  ? CESAREAN SECTION N/A 03/01/2016  ? Procedure: CESAREAN SECTION;  Surgeon: Conard NovakStephen D Jackson, MD;  Location: [facility] ORS;  Service: Obstetrics;  Laterality: N/A;  ? CESAREAN SECTION N/A 02/11/2020  ? Procedure: CESAREAN SECTION;  Surgeon: Conard NovakJackson, Stephen D, MD;  Location: [facility] ORS;  Service: Obstetrics;  Laterality: N/A;  RNFA  ? CESAREAN SECTION WITH BILATERAL TUBAL LIGATION N/A 09/14/2021  ? Procedure: CESAREAN SECTION WITH BILATERAL TUBAL LIGATION;  Surgeon: Conard NovakJackson, Stephen D, MD;  Location: [facility] ORS;  Service: Obstetrics;  Laterality: N/A;  ? ?Patient Active Problem List  ? Diagnosis Date Noted  ? Pernicious anemia 11/13/2021  ? History of cesarean delivery 09/14/2021  ? Back pain affecting pregnancy in third trimester 08/27/2021  ? B12 deficiency 07/13/2021  ? Thrombocytopenia affecting pregnancy, antepartum (HCC) 07/10/2021  ? Depressed mood 06/14/2021  ? History of cesarean delivery affecting pregnancy 06/14/2021  ? Prenatal care, subsequent pregnancy, second trimester 02/23/2021  ? Supervision of  high risk pregnancy, antepartum 07/02/2019  ? PTSD (post-traumatic stress disorder) 01/08/2013  ? MDD (major depressive disorder), recurrent episode, severe (HCC) 01/08/2013  ? ? ?REFERRING DIAG: T14.8XXA (ICD-10-CM) - Nerve injury ?R26.2 (ICD-10-CM) - Difficulty walking ? ?THERAPY DIAG:  ?Muscle weakness (generalized) ? ?Difficulty in walking, not elsewhere classified ? ?PERTINENT HISTORY: 09/14/2021 c-section with resulting nerve injury and difficulty walking ? ?PRECAUTIONS: None ? ?SUBJECTIVE: Patient presents to clinic after brief hiatus 2/2 to awaiting insurance auth. Notes that she has good days and bad days. She has been working on [employer] and has been practicing stair climbing and descending; climbing is more provocative of R femoral nerve pain. Patient also attempted driving again recently and while she was able to drive further the activity caused significant pain/symptom onset in less than 5 minutes.  ? ?PAIN:  ?Are you having pain? Yes: NPRS scale: 5/10 ?TREATMENT ? ?Therapeutic Exercise: ?Sci-fit, seat 2, L0, for warm-up, during history taking, x5 min ?Total gym squats, hip width, x10.TCs for improved motor control ?Total gym squats, plie position, x10.TCs for improved motor control ?Total gym squats sumo, x10 ?Total gym squats narrow stance with hip adduction isometric, x10 ?Total gym, sidelying SLS squat, x10, each for improved knee control ?Total gym, quadruped SLS donkey kick, for improved co-contraction of glutes and quads ?Total gym, Pilates knee stretch for postural control ? ?Patient educated throughout session on appropriate technique and form using multi-modal cueing, HEP, and activity modification. Patient articulated understanding and  returned demonstration. ? ?Patient Response to interventions: ?Notes good workout ? ? ? ? PT Long Term Goals   ? ?  ? PT LONG TERM GOAL #1  ? Title Patient will be independent with HEP in order to improve strength and balance in order to decrease fall risk and  improve function at home and in the community.   ? Baseline IE: not initiated; 2/7: IND   ? Time 6   ? Period Weeks   ? Status Achieved   ? Target Date 11/15/21   ?  ? PT LONG TERM GOAL #2  ? Title Patient will decrease 5TSTS to < 12 seconds without UE support nor compensatory patterns in order to demonstrate clinically significant improvement in LE strength and to approach peer normative values.   ? Baseline IE: 63.4 sec (LLE bias); 2/7: intermittent UE support no bias 45.5 sec, LLE bias 20.5 sec; 3/8: 14.54 sec, UE support, no bias   ? Time 6   ? Period Weeks   ? Status On-going   ? Target Date 01/30/22   ?  ? PT LONG TERM GOAL #3  ? Title Patient will decrease TUG to below 14 seconds/decrease in order to demonstrate decreased fall risk.   ? Baseline IE: assess at next visit   ? Time 6   ? Period Weeks   ? Status Deferred   ?  ? PT LONG TERM GOAL #4  ? Title Patient will increase by at least 33m (143ft) in order to demonstrate clinically significant improvement in cardiopulmonary endurance and community ambulation   ? Baseline IE: assess at next visit; 12/27: 2 min walk test: 56 ft, 5/10 pain (burning), RW, SBA, 2/7: deferred; 2/16: 538 ft, no AD, IND, 2/10 pain; 3/8: 784 ft, no AD, IND, 5/10 pain   ? Time 6   ? Period Weeks   ? Status On-going   ? Target Date 01/30/22   ?  ? PT LONG TERM GOAL #5  ? Title Patient will demonstrate improved function as evidenced by a score of 68 on FOTO measure for full participation in activities at home and in the community.   ? Baseline IE: 50; 1/30: 58; 3/8: 64   ? Time 6   ? Period Weeks   ? Status On-going   ? Target Date 01/30/22   ? ?  ?  ? ?  ? ? Plan   ? ? Clinical Impression Statement Patient presents to clinic with excellent motivation to participate in therapy. Patient demonstrates deficits in RLE strength, gait, pain, and function. Patient able to tolerate more postural control and knee control interventions during today's session.  Patient will benefit from  continued skilled therapeutic intervention to address remaining deficits in RLE strength, gait, pain, and function in order to increase function and improve overall QOL.   ? Personal Factors and Comorbidities Comorbidity 3+;Time since onset of injury/illness/exacerbation   ? Comorbidities anemia, asthma, depression, GERD, migraines, PTSD   ? Examination-Activity Limitations Bathing;Hygiene/Grooming;Toileting;Squat;Sleep;Transfers;Dressing;Stairs;Bend;Lift   ? Examination-Participation Restrictions Community Activity;Occupation;Driving;Meal Prep;Cleaning;Shop;Laundry   ? Stability/Clinical Decision Making Evolving/Moderate complexity   ? Rehab Potential Good   ? PT Frequency 1x / week   ? PT Duration 6 weeks   ? PT Treatment/Interventions ADLs/Self Care Home Management;Cryotherapy;Electrical Stimulation;Moist Heat;Neuromuscular re-education;Therapeutic exercise;Stair training;Gait training;Patient/family education;Orthotic Fit/Training;Manual techniques;Scar mobilization;Taping;Joint Manipulations;Spinal Manipulations   ? PT Home Exercise Plan [email or web address]; [id number]   ? Consulted and Agree with Plan of Care Patient   ? ?  ?  ? ?  ? ? ?  Sheria Lang PT, DPT [id number]  ?01/28/2022, 2:34 PM ? ?  ? ?

## 2022-02-04 DIAGNOSIS — R200 Anesthesia of skin: Secondary | ICD-10-CM | POA: Diagnosis not present

## 2022-02-04 DIAGNOSIS — T148XXA Other injury of unspecified body region, initial encounter: Secondary | ICD-10-CM | POA: Diagnosis not present

## 2022-02-04 DIAGNOSIS — R531 Weakness: Secondary | ICD-10-CM | POA: Diagnosis not present

## 2022-02-06 ENCOUNTER — Encounter: Payer: Self-pay | Admitting: Physical Therapy

## 2022-02-06 ENCOUNTER — Ambulatory Visit: Payer: Medicaid Other | Admitting: Physical Therapy

## 2022-02-06 DIAGNOSIS — M6281 Muscle weakness (generalized): Secondary | ICD-10-CM

## 2022-02-06 DIAGNOSIS — R262 Difficulty in walking, not elsewhere classified: Secondary | ICD-10-CM | POA: Diagnosis not present

## 2022-02-06 NOTE — Therapy (Signed)
?OUTPATIENT PHYSICAL THERAPY TREATMENT NOTE ? ? ?Patient Name: Nicole Hartman ?MRN: [id number] ?DOB:1995/12/24, 26 y.o., female ?Today's Date: 02/06/2022 ? ?PCP: [facility] ?REFERRING PROVIDER: [facility] [employer]* ? ? PT End of Session - 02/06/22 1420   ? ? Visit Number 20   ? Number of Visits 31   ? Date for PT Re-Evaluation 03/15/22   ? PT Start Time 1415   ? PT Stop Time 1455   ? PT Time Calculation (min) 40 min   ? Activity Tolerance Patient tolerated treatment well   ? Behavior During Therapy [facility] for tasks assessed/performed   ? ?  ?  ? ?  ? ? ?Past Medical History:  ?Diagnosis Date  ? Anemia   ? Asthma   ? exercise induced  ? Cesarean delivery delivered 03/02/2016  ? Depression 2014  ? GERD (gastroesophageal reflux disease)   ? Headache   ? h/o migraines  ? PTSD (post-traumatic stress disorder) 2014  ? Thrombocytopenia (HCC)   ? ?Past Surgical History:  ?Procedure Laterality Date  ? CESAREAN SECTION N/A 03/01/2016  ? Procedure: CESAREAN SECTION;  Surgeon: Conard Novak, MD;  Location: [facility] ORS;  Service: Obstetrics;  Laterality: N/A;  ? CESAREAN SECTION N/A 02/11/2020  ? Procedure: CESAREAN SECTION;  Surgeon: Conard Novak, MD;  Location: [facility] ORS;  Service: Obstetrics;  Laterality: N/A;  RNFA  ? CESAREAN SECTION WITH BILATERAL TUBAL LIGATION N/A 09/14/2021  ? Procedure: CESAREAN SECTION WITH BILATERAL TUBAL LIGATION;  Surgeon: Conard Novak, MD;  Location: [facility] ORS;  Service: Obstetrics;  Laterality: N/A;  ? ?Patient Active Problem List  ? Diagnosis Date Noted  ? Pernicious anemia 11/13/2021  ? History of cesarean delivery 09/14/2021  ? Back pain affecting pregnancy in third trimester 08/27/2021  ? B12 deficiency 07/13/2021  ? Thrombocytopenia affecting pregnancy, antepartum (HCC) 07/10/2021  ? Depressed mood 06/14/2021  ? History of cesarean delivery affecting pregnancy 06/14/2021  ? Prenatal care, subsequent pregnancy, second trimester 02/23/2021  ? Supervision of  high risk pregnancy, antepartum 07/02/2019  ? PTSD (post-traumatic stress disorder) 01/08/2013  ? MDD (major depressive disorder), recurrent episode, severe (HCC) 01/08/2013  ? ? ?REFERRING DIAG: T14.8XXA (ICD-10-CM) - Nerve injury ?R26.2 (ICD-10-CM) - Difficulty walking ? ?THERAPY DIAG:  ?Muscle weakness (generalized) ? ?Difficulty in walking, not elsewhere classified ? ?PERTINENT HISTORY: 09/14/2021 c-section with resulting nerve injury and difficulty walking ? ?PRECAUTIONS: None ? ?SUBJECTIVE: Patient had follow-up with OB/GYN that performed c-section and received referral for second opinion from neurology.  ? ?PAIN:  ?Are you having pain? Yes: NPRS scale: 2/10 ?TREATMENT ? ?Therapeutic Exercise: ?Sci-fit, seat 2, L0, for warm-up, during history taking, x5 min ?Reassessed goals; see below. ?Total gym squats, hip width, x10.TCs for improved motor control ?Total gym squats, plie position, x10.TCs for improved motor control ?Total gym squats IR, x10 ?Total gym, sidelying SLS squat, x10, each for improved knee control ? ?Patient educated throughout session on appropriate technique and form using multi-modal cueing, HEP, and activity modification. Patient articulated understanding and returned demonstration. ? ?Patient Response to interventions: ?Notes good workout ? ? ? ? PT Long Term Goals   ? ?  ? PT LONG TERM GOAL #1  ? Title Patient will be independent with HEP in order to improve strength and balance in order to decrease fall risk and improve function at home and in the community.   ? Baseline IE: not initiated; 2/7: IND   ? Time 6   ? Period Weeks   ?  Status Achieved   ? Target Date 11/15/21   ?  ? PT LONG TERM GOAL #2  ? Title Patient will decrease 5TSTS to < 12 seconds without UE support nor compensatory patterns in order to demonstrate clinically significant improvement in LE strength and to approach peer normative values.   ? Baseline IE: 63.4 sec (LLE bias); 2/7: intermittent UE support no bias 45.5 sec,  LLE bias 20.5 sec; 3/8: 14.54 sec, UE support, no bias; 4/26: 10.96 sec, no UE support, no bias  ? Time 6   ? Period Weeks   ? Status Achieved  ? Target Date 01/30/22   ?  ? PT LONG TERM GOAL #3  ? Title Patient will decrease TUG to below 14 seconds/decrease in order to demonstrate decreased fall risk.   ? Baseline IE: assess at next visit   ? Time 6   ? Period Weeks   ? Status Deferred   ?  ? PT LONG TERM GOAL #4  ? Title Patient will increase by at least 38m (175ft) in order to demonstrate clinically significant improvement in cardiopulmonary endurance and community ambulation   ? Baseline IE: assess at next visit; 12/27: 2 min walk test: 56 ft, 5/10 pain (burning), RW, SBA, 2/7: deferred; 2/16: 538 ft, no AD, IND, 2/10 pain; 3/8: 784 ft, no AD, IND, 5/10 pain; 4/26: 1140 ft, no AD, IND, 3/10  ? Time 6   ? Period Weeks   ? Status On-going   ? Target Date 03/15/2022   ?  ? PT LONG TERM GOAL #5  ? Title Patient will demonstrate improved function as evidenced by a score of 68 on FOTO measure for full participation in activities at home and in the community.   ? Baseline IE: 50; 1/30: 58; 3/8: 64; 4/26: 69  ? Time 6   ? Period Weeks   ? Status Achieved  ? Target Date 01/30/22   ? ?  ?  ? ?  ? ? Plan   ? ? Clinical Impression Statement Patient presents to clinic with excellent motivation to participate in therapy. Goal reassessment shows significant progress toward or goal achievement in all areas (see above). Patient does continue to demonstrate deficits in RLE strength, gait, pain, and function. Patient able to tolerate more postural control and knee control interventions during today's session.  Patient will benefit from continued skilled therapeutic intervention to address remaining deficits in RLE strength, gait, pain, and function in order to increase function and improve overall QOL.   ? Personal Factors and Comorbidities Comorbidity 3+;Time since onset of injury/illness/exacerbation   ? Comorbidities anemia,  asthma, depression, GERD, migraines, PTSD   ? Examination-Activity Limitations Bathing;Hygiene/Grooming;Toileting;Squat;Sleep;Transfers;Dressing;Stairs;Bend;Lift   ? Examination-Participation Restrictions Community Activity;Occupation;Driving;Meal Prep;Cleaning;Shop;Laundry   ? Stability/Clinical Decision Making Evolving/Moderate complexity   ? Rehab Potential Good   ? PT Frequency 1x / week   ? PT Duration 6 weeks   ? PT Treatment/Interventions ADLs/Self Care Home Management;Cryotherapy;Electrical Stimulation;Moist Heat;Neuromuscular re-education;Therapeutic exercise;Stair training;Gait training;Patient/family education;Orthotic Fit/Training;Manual techniques;Scar mobilization;Taping;Joint Manipulations;Spinal Manipulations   ? PT Home Exercise Plan [id number]; [id number]   ? Consulted and Agree with Plan of Care Patient   ? ?  ?  ? ?  ? ? Sheria Lang PT, DPT [id number]  ?02/06/2022, 2:21 PM ? ?  ? ?

## 2022-02-13 ENCOUNTER — Encounter: Payer: Self-pay | Admitting: Gastroenterology

## 2022-02-13 DIAGNOSIS — R1030 Lower abdominal pain, unspecified: Secondary | ICD-10-CM | POA: Diagnosis not present

## 2022-02-13 DIAGNOSIS — R1031 Right lower quadrant pain: Secondary | ICD-10-CM | POA: Diagnosis not present

## 2022-02-13 DIAGNOSIS — J45909 Unspecified asthma, uncomplicated: Secondary | ICD-10-CM | POA: Diagnosis not present

## 2022-02-13 DIAGNOSIS — I10 Essential (primary) hypertension: Secondary | ICD-10-CM | POA: Diagnosis not present

## 2022-02-13 DIAGNOSIS — R109 Unspecified abdominal pain: Secondary | ICD-10-CM | POA: Diagnosis not present

## 2022-02-13 DIAGNOSIS — N939 Abnormal uterine and vaginal bleeding, unspecified: Secondary | ICD-10-CM | POA: Diagnosis not present

## 2022-02-13 DIAGNOSIS — F319 Bipolar disorder, unspecified: Secondary | ICD-10-CM | POA: Diagnosis not present

## 2022-02-14 ENCOUNTER — Ambulatory Visit: Payer: Medicaid Other | Admitting: Anesthesiology

## 2022-02-14 ENCOUNTER — Ambulatory Visit
Admission: RE | Admit: 2022-02-14 | Discharge: 2022-02-14 | Disposition: A | Discharge status: Home / Self Care | Payer: Medicaid Other | Attending: Gastroenterology | Admitting: Gastroenterology

## 2022-02-14 ENCOUNTER — Encounter: Payer: Self-pay | Admitting: Gastroenterology

## 2022-02-14 ENCOUNTER — Encounter
Admission: RE | Disposition: A | Discharge status: Home / Self Care | Payer: Self-pay | Source: Home / Self Care | Attending: Gastroenterology

## 2022-02-14 ENCOUNTER — Other Ambulatory Visit: Payer: Self-pay

## 2022-02-14 DIAGNOSIS — D125 Benign neoplasm of sigmoid colon: Secondary | ICD-10-CM | POA: Diagnosis not present

## 2022-02-14 DIAGNOSIS — K635 Polyp of colon: Secondary | ICD-10-CM

## 2022-02-14 DIAGNOSIS — K644 Residual hemorrhoidal skin tags: Secondary | ICD-10-CM | POA: Diagnosis not present

## 2022-02-14 DIAGNOSIS — K649 Unspecified hemorrhoids: Secondary | ICD-10-CM | POA: Diagnosis not present

## 2022-02-14 DIAGNOSIS — K625 Hemorrhage of anus and rectum: Secondary | ICD-10-CM | POA: Diagnosis not present

## 2022-02-14 DIAGNOSIS — D510 Vitamin B12 deficiency anemia due to intrinsic factor deficiency: Secondary | ICD-10-CM | POA: Diagnosis not present

## 2022-02-14 DIAGNOSIS — K2950 Unspecified chronic gastritis without bleeding: Secondary | ICD-10-CM | POA: Insufficient documentation

## 2022-02-14 DIAGNOSIS — R262 Difficulty in walking, not elsewhere classified: Secondary | ICD-10-CM | POA: Diagnosis not present

## 2022-02-14 DIAGNOSIS — J45909 Unspecified asthma, uncomplicated: Secondary | ICD-10-CM | POA: Diagnosis not present

## 2022-02-14 DIAGNOSIS — K2960 Other gastritis without bleeding: Secondary | ICD-10-CM | POA: Diagnosis not present

## 2022-02-14 HISTORY — PX: ESOPHAGOGASTRODUODENOSCOPY (EGD) WITH PROPOFOL: SHX5813

## 2022-02-14 HISTORY — PX: COLONOSCOPY WITH PROPOFOL: SHX5780

## 2022-02-14 LAB — POCT PREGNANCY, URINE: Preg Test, Ur: NEGATIVE

## 2022-02-14 SURGERY — COLONOSCOPY WITH PROPOFOL
Anesthesia: General

## 2022-02-14 MED ORDER — MIDAZOLAM HCL 2 MG/2ML IJ SOLN
INTRAMUSCULAR | Status: DC | PRN
  Administered 2022-02-14: 2 mg via INTRAVENOUS

## 2022-02-14 MED ORDER — PROPOFOL 500 MG/50ML IV EMUL
INTRAVENOUS | Status: DC | PRN
  Administered 2022-02-14: 155 ug/kg/min via INTRAVENOUS

## 2022-02-14 MED ORDER — MIDAZOLAM HCL 2 MG/2ML IJ SOLN
INTRAMUSCULAR | Status: AC
  Filled 2022-02-14: qty 2

## 2022-02-14 MED ORDER — SODIUM CHLORIDE 0.9 % IV SOLN: INTRAVENOUS | Status: DC

## 2022-02-14 MED ORDER — PROPOFOL 10 MG/ML IV BOLUS
INTRAVENOUS | Status: DC | PRN
Start: 2022-02-14 — End: 2022-02-14
  Administered 2022-02-14: 50 mg via INTRAVENOUS
  Administered 2022-02-14: 20 mg via INTRAVENOUS
  Administered 2022-02-14: 30 mg via INTRAVENOUS

## 2022-02-14 MED ORDER — GLYCOPYRROLATE 0.2 MG/ML IJ SOLN
INTRAMUSCULAR | Status: DC | PRN
  Administered 2022-02-14: .2 mg via INTRAVENOUS

## 2022-02-14 MED ORDER — LIDOCAINE HCL (CARDIAC) PF 100 MG/5ML IV SOSY
PREFILLED_SYRINGE | INTRAVENOUS | Status: DC | PRN
  Administered 2022-02-14: 60 mg via INTRAVENOUS
  Administered 2022-02-14: 20 mg via INTRAVENOUS

## 2022-02-14 NOTE — Anesthesia Postprocedure Evaluation (Signed)
Anesthesia Post Note ? ?Patient: Nicole Hartman ? ?Procedure(s) Performed: COLONOSCOPY WITH PROPOFOL ?ESOPHAGOGASTRODUODENOSCOPY (EGD) WITH PROPOFOL ? ?Patient location during evaluation: PACU ?Anesthesia Type: General ?Level of consciousness: awake and alert, oriented and patient cooperative ?Pain management: pain level controlled ?Vital Signs Assessment: post-procedure vital signs reviewed and stable ?Respiratory status: spontaneous breathing, nonlabored ventilation and respiratory function stable ?Cardiovascular status: blood pressure returned to baseline and stable ?Postop Assessment: adequate PO intake ?Anesthetic complications: no ? ? ?No notable events documented. ? ? ?Last Vitals:  ?Vitals:  ? 02/14/22 1105 02/14/22 1115  ?BP: 102/72   ?Pulse:    ?Resp:    ?Temp:    ?SpO2:  100%  ?  ?Last Pain:  ?Vitals:  ? 02/14/22 1115  ?TempSrc:   ?PainSc: 0-No pain  ? ? ?  ?  ?  ?  ?  ?  ? ?Darrin Nipper ? ? ? ? ?

## 2022-02-14 NOTE — Op Note (Signed)
[facility] ?Gastroenterology ?Patient Name: Nicole Hartman ?Procedure Date: 02/14/2022 10:25 AM ?MRN: [id number] ?Account #: [id number] ?Date of Birth: Aug 04, 1996 ?Admit Type: Outpatient ?Age: 26 ?Room: [facility] ENDO ROOM 3 ?Gender: Female ?Note Status: Finalized ?Instrument Name: Peds Colonoscope [id number] ?Procedure:             Colonoscopy ?Indications:           Rectal bleeding ?Providers:             Lin Landsman MD, MD ?Medicines:             General Anesthesia ?Complications:         No immediate complications. Estimated blood loss: None. ?Procedure:             Pre-Anesthesia Assessment: ?                       - Prior to the procedure, a History and Physical was  ?                       performed, and patient medications and allergies were  ?                       reviewed. The patient is competent. The risks and  ?                       benefits of the procedure and the sedation options and  ?                       risks were discussed with the patient. All questions  ?                       were answered and informed consent was obtained.  ?                       Patient identification and proposed procedure were  ?                       verified by the physician, the nurse, the  ?                       anesthesiologist, the anesthetist and the technician  ?                       in the pre-procedure area in the procedure room in the  ?                       endoscopy suite. Mental Status Examination: alert and  ?                       oriented. Airway Examination: normal oropharyngeal  ?                       airway and neck mobility. Respiratory Examination:  ?                       clear to auscultation. CV Examination: normal.  ?                       Prophylactic Antibiotics: The patient does  not require  ?                       prophylactic antibiotics. Prior Anticoagulants: The  ?                       patient has taken no previous anticoagulant or  ?                        antiplatelet agents. ASA Grade Assessment: II - A  ?                       patient with mild systemic disease. After reviewing  ?                       the risks and benefits, the patient was deemed in  ?                       satisfactory condition to undergo the procedure. The  ?                       anesthesia plan was to use general anesthesia.  ?                       Immediately prior to administration of medications,  ?                       the patient was re-assessed for adequacy to receive  ?                       sedatives. The heart rate, respiratory rate, oxygen  ?                       saturations, blood pressure, adequacy of pulmonary  ?                       ventilation, and response to care were monitored  ?                       throughout the procedure. The physical status of the  ?                       patient was re-assessed after the procedure. ?                       After obtaining informed consent, the colonoscope was  ?                       passed under direct vision. Throughout the procedure,  ?                       the patient's blood pressure, pulse, and oxygen  ?                       saturations were monitored continuously. The  ?                       Colonoscope was introduced through the anus and  ?  advanced to the the terminal ileum, with  ?                       identification of the appendiceal orifice and IC  ?                       valve. The colonoscopy was performed without  ?                       difficulty. The patient tolerated the procedure well.  ?                       The quality of the bowel preparation was evaluated  ?                       using the BBPS [facility] Bowel Preparation Scale) with  ?                       scores of: Right Colon = 3, Transverse Colon = 3 and  ?                       Left Colon = 3 (entire mucosa seen well with no  ?                       residual staining, small fragments of stool or opaque  ?                        liquid). The total BBPS score equals 9. ?Findings: ?     The perianal and digital rectal examinations were normal. Pertinent  ?     negatives include normal sphincter tone and no palpable rectal lesions. ?     The terminal ileum appeared normal. ?     A 4 mm polyp was found in the recto-sigmoid colon. The polyp was  ?     sessile. The polyp was removed with a cold snare. Resection and  ?     retrieval were complete. Estimated blood loss: none. ?     Non-bleeding external hemorrhoids were found during retroflexion. The  ?     hemorrhoids were medium-sized. ?Impression:            - The examined portion of the ileum was normal. ?                       - One 4 mm polyp at the recto-sigmoid colon, removed  ?                       with a cold snare. Resected and retrieved. ?                       - Non-bleeding external hemorrhoids. ?Recommendation:        - Discharge patient to home (with escort). ?                       - Resume previous diet today. ?                       - Continue present medications. ?                       -  Await pathology results. ?                       - Return to my office in 1-2 weeks for hemorrhoid  ?                       banding. ?Procedure Code(s):     --- Professional --- ?                       [phone], Colonoscopy, flexible; with removal of  ?                       tumor(s), polyp(s), or other lesion(s) by snare  ?                       technique ?Diagnosis Code(s):     --- Professional --- ?                       K64.4, Residual hemorrhoidal skin tags ?                       K63.5, Polyp of colon ?                       K62.5, Hemorrhage of anus and rectum ?CPT copyright 2019 American Medical Association. All rights reserved. ?The codes documented in this report are preliminary and upon coder review may  ?be revised to meet current compliance requirements. ?Dr. Ulyess Mort ?Analilia Geddis Raeanne Gathers MD, MD ?02/14/2022 10:54:53 AM ?This report has been signed electronically. ?Number of  Addenda: 0 ?Note Initiated On: 02/14/2022 10:25 AM ?Scope Withdrawal Time: 0 hours 4 minutes 26 seconds  ?Total Procedure Duration: 0 hours 7 minutes 8 seconds  ?Estimated Blood Loss:  Estimated blood loss: none. ?     [facility] ?

## 2022-02-14 NOTE — Transfer of Care (Signed)
Immediate Anesthesia Transfer of Care Note ? ?Patient: Nicole Hartman ? ?Procedure(s) Performed: COLONOSCOPY WITH PROPOFOL ?ESOPHAGOGASTRODUODENOSCOPY (EGD) WITH PROPOFOL ? ?Patient Location: Endoscopy Unit ? ?Anesthesia Type:General ? ?Level of Consciousness: drowsy and patient cooperative ? ?Airway & Oxygen Therapy: Patient Spontanous Breathing and Patient connected to face mask oxygen ? ?Post-op Assessment: Report given to RN and Post -op Vital signs reviewed and stable ? ?Post vital signs: Reviewed and stable ? ?Last Vitals:  ?Vitals Value Taken Time  ?BP 86/56 02/14/22 1056  ?Temp 36.2 ?C 02/14/22 1055  ?Pulse 99 02/14/22 1057  ?Resp 18 02/14/22 1057  ?SpO2 99 % 02/14/22 1057  ?Vitals shown include unvalidated device data. ? ?Last Pain:  ?Vitals:  ? 02/14/22 1055  ?TempSrc: Temporal  ?PainSc: Asleep  ?   ? ?  ? ?Complications: No notable events documented. ?

## 2022-02-14 NOTE — Anesthesia Preprocedure Evaluation (Addendum)
Anesthesia Evaluation  ?Patient identified by MRN, date of birth, ID band ?Patient awake ? ? ? ?Reviewed: ?Allergy & Precautions, NPO status , Patient's Chart, lab work & pertinent test results ? ?History of Anesthesia Complications ?Negative for: history of anesthetic complications ? ?Airway ?Mallampati: I ? ? ?Neck ROM: Full ? ? ? Dental ?no notable dental hx. ? ?  ?Pulmonary ?asthma ,  ?  ?Pulmonary exam normal ?breath sounds clear to auscultation ? ? ? ? ? ? Cardiovascular ?Exercise Tolerance: Good ?negative cardio ROS ?Normal cardiovascular exam ?Rhythm:Regular Rate:Normal ? ? ?  ?Neuro/Psych ? Headaches, PSYCHIATRIC DISORDERS (PTSD) Anxiety Depression LE weakness ?  ? GI/Hepatic ?GERD  ,  ?Endo/Other  ?negative endocrine ROS ? Renal/GU ?negative Renal ROS  ? ?  ?Musculoskeletal ? ? Abdominal ?  ?Peds ? Hematology ? ?(+) Blood dyscrasia, anemia ,   ?Anesthesia Other Findings ? ? Reproductive/Obstetrics ? ?  ? ? ? ? ? ? ? ? ? ? ? ? ? ?  ?  ? ? ? ? ? ? ? ?Anesthesia Physical ?Anesthesia Plan ? ?ASA: 2 ? ?Anesthesia Plan: General  ? ?Post-op Pain Management:   ? ?Induction: Intravenous ? ?PONV Risk Score and Plan: 3 and Propofol infusion, TIVA and Treatment may vary due to age or medical condition ? ?Airway Management Planned: Natural Airway ? ?Additional Equipment:  ? ?Intra-op Plan:  ? ?Post-operative Plan:  ? ?Informed Consent: I have reviewed the patients History and Physical, chart, labs and discussed the procedure including the risks, benefits and alternatives for the proposed anesthesia with the patient or authorized representative who has indicated his/her understanding and acceptance.  ? ? ? ? ? ?Plan Discussed with: CRNA ? ?Anesthesia Plan Comments: (LMA/GETA backup discussed.  Patient consented for risks of anesthesia including but not limited to:  ?- adverse reactions to medications ?- damage to eyes, teeth, lips or other oral mucosa ?- nerve damage due to positioning   ?- sore throat or hoarseness ?- damage to heart, brain, nerves, lungs, other parts of body or loss of life ? ?Informed patient about role of CRNA in peri- and intra-operative care.  Patient voiced understanding.)  ? ? ? ? ? ? ?Anesthesia Quick Evaluation ? ?

## 2022-02-14 NOTE — Op Note (Signed)
[facility] ?Gastroenterology ?Patient Name: Nicole Hartman ?Procedure Date: 02/14/2022 10:27 AM ?MRN: [id number] ?Account #: [id number] ?Date of Birth: 1995-12-25 ?Admit Type: Outpatient ?Age: 26 ?Room: [facility] ENDO ROOM 3 ?Gender: Female ?Note Status: Finalized ?Instrument Name: Upper Endoscope [id number] ?Procedure:             Upper GI endoscopy ?Indications:           Pernicious anemia ?Providers:             Lin Landsman MD, MD ?Referring MD:          [facility] ?Medicines:             General Anesthesia ?Complications:         No immediate complications. Estimated blood loss: None. ?Procedure:             Pre-Anesthesia Assessment: ?                       - Prior to the procedure, a History and Physical was  ?                       performed, and patient medications and allergies were  ?                       reviewed. The patient is competent. The risks and  ?                       benefits of the procedure and the sedation options and  ?                       risks were discussed with the patient. All questions  ?                       were answered and informed consent was obtained.  ?                       Patient identification and proposed procedure were  ?                       verified by the physician, the nurse, the  ?                       anesthesiologist, the anesthetist and the technician  ?                       in the pre-procedure area in the procedure room in the  ?                       endoscopy suite. Mental Status Examination: alert and  ?                       oriented. Airway Examination: normal oropharyngeal  ?                       airway and neck mobility. Respiratory Examination:  ?                       clear to auscultation. CV Examination: normal.  ?  Prophylactic Antibiotics: The patient does not require  ?                       prophylactic antibiotics. Prior Anticoagulants: The  ?                       patient has  taken no previous anticoagulant or  ?                       antiplatelet agents. ASA Grade Assessment: II - A  ?                       patient with mild systemic disease. After reviewing  ?                       the risks and benefits, the patient was deemed in  ?                       satisfactory condition to undergo the procedure. The  ?                       anesthesia plan was to use general anesthesia.  ?                       Immediately prior to administration of medications,  ?                       the patient was re-assessed for adequacy to receive  ?                       sedatives. The heart rate, respiratory rate, oxygen  ?                       saturations, blood pressure, adequacy of pulmonary  ?                       ventilation, and response to care were monitored  ?                       throughout the procedure. The physical status of the  ?                       patient was re-assessed after the procedure. ?                       After obtaining informed consent, the endoscope was  ?                       passed under direct vision. Throughout the procedure,  ?                       the patient's blood pressure, pulse, and oxygen  ?                       saturations were monitored continuously. The Endoscope  ?                       was introduced through the mouth, and advanced to the  ?  second part of duodenum. The upper GI endoscopy was  ?                       accomplished without difficulty. The patient tolerated  ?                       the procedure well. ?Findings: ?     The gastroesophageal junction and examined esophagus were normal. ?     Esophagogastric landmarks were identified: the gastroesophageal junction  ?     was found at 36 cm from the incisors. ?     The entire examined stomach was normal. Biopsies were taken with a cold  ?     forceps for Helicobacter pylori testing. ?     The cardia and gastric fundus were normal on retroflexion. ?     The duodenal  bulb and examined duodenum were normal. ?Impression:            - Normal gastroesophageal junction and esophagus. ?                       - Esophagogastric landmarks identified. ?                       - Normal stomach. Biopsied. ?                       - Normal duodenal bulb and examined duodenum. ?Recommendation:        - Await pathology results. ?                       - Proceed with colonoscopy as scheduled ?                       See colonoscopy report ?Procedure Code(s):     --- Professional --- ?                       [phone], Esophagogastroduodenoscopy, flexible,  ?                       transoral; with biopsy, single or multiple ?Diagnosis Code(s):     --- Professional --- ?                       D51.0, Vitamin B12 deficiency anemia due to intrinsic  ?                       factor deficiency ?CPT copyright 2019 American Medical Association. All rights reserved. ?The codes documented in this report are preliminary and upon coder review may  ?be revised to meet current compliance requirements. ?Dr. Ulyess Mort ?Jamari Moten Raeanne Gathers MD, MD ?02/14/2022 10:43:23 AM ?This report has been signed electronically. ?Number of Addenda: 0 ?Note Initiated On: 02/14/2022 10:27 AM ?Estimated Blood Loss:  Estimated blood loss: none. ?     [facility] ?

## 2022-02-14 NOTE — H&P (Signed)
?Nicole Repress, MD ?[address]  ?[address]  ?[place], [place] [address]  ?Main: [phone]  ?Fax: [phone] ?Pager: [phone] ? ?Primary Care Physician:  [facility] ?Primary Gastroenterologist:  Dr. Arlyss Hartman ? ?Pre-Procedure History & Physical: ?HPI:  Nicole Hartman is a 26 y.o. female is here for an endoscopy and colonoscopy. ?  ?Past Medical History:  ?Diagnosis Date  ? Anemia   ? Asthma   ? exercise induced  ? Cesarean delivery delivered 03/02/2016  ? Depression 2014  ? GERD (gastroesophageal reflux disease)   ? Headache   ? h/o migraines  ? PTSD (post-traumatic stress disorder) 2014  ? Thrombocytopenia (HCC)   ? ? ?Past Surgical History:  ?Procedure Laterality Date  ? CESAREAN SECTION N/A 03/01/2016  ? Procedure: CESAREAN SECTION;  Surgeon: Conard Novak, MD;  Location: [facility] ORS;  Service: Obstetrics;  Laterality: N/A;  ? CESAREAN SECTION N/A 02/11/2020  ? Procedure: CESAREAN SECTION;  Surgeon: Conard Novak, MD;  Location: [facility] ORS;  Service: Obstetrics;  Laterality: N/A;  RNFA  ? CESAREAN SECTION WITH BILATERAL TUBAL LIGATION N/A 09/14/2021  ? Procedure: CESAREAN SECTION WITH BILATERAL TUBAL LIGATION;  Surgeon: Conard Novak, MD;  Location: [facility] ORS;  Service: Obstetrics;  Laterality: N/A;  ? ? ?Prior to Admission medications   ?Medication Sig Start Date End Date Taking? Authorizing Provider  ?Cobalamin Combinations (B-12) [phone] MCG SUBL  11/09/21  Yes [provider]  ?Ferrous Sulfate (IRON PO) Take 1 tablet by mouth daily.   Yes [provider]  ?ipratropium (ATROVENT) 0.06 % nasal spray Place 2 sprays into both nostrils 4 (four) times daily. 01/22/22  Yes Shirlee Latch, PA-C  ?Prenatal Vit-Fe Fumarate-FA (PNV PRENATAL PLUS MULTIVITAMIN) 27-1 MG TABS Take 1 tablet by mouth daily.   Yes [provider]  ?albuterol (VENTOLIN HFA) 108 (90 Base) MCG/ACT inhaler Inhale 2 puffs into the lungs 3 (three) times daily as needed  for wheezing (for cough). ?Patient not taking: Reported on 02/14/2022    [provider]  ?calcium carbonate (TUMS - DOSED IN MG ELEMENTAL CALCIUM) 500 MG chewable tablet Chew 2 tablets (400 mg of elemental calcium total) by mouth daily as needed for indigestion or heartburn. ?Patient not taking: Reported on 12/26/2021 09/17/21   Vena Austria, MD  ?lidocaine (XYLOCAINE) 2 % solution Use as directed 15 mLs in the mouth or throat every 3 (three) hours as needed for mouth pain (swish and spit). 01/22/22   Shirlee Latch, PA-C  ? ? ?Allergies as of 01/23/2022  ? (No Known Allergies)  ? ? ?Family History  ?Problem Relation Age of Onset  ? Healthy Mother   ? Healthy Father   ? Cancer - Colon Maternal Grandfather   ? Breast cancer Paternal Grandmother   ?     60s  ? Breast cancer Maternal Aunt   ?     not sure of age  ? ? ?Social History  ? ?Socioeconomic History  ? Marital status: Married  ?  Spouse name: Nicole Hartman  ? Number of children: 3  ? Years of education: college  ? Highest education level: Not on file  ?Occupational History  ? Occupation: Sales executive  ?Tobacco Use  ? Smoking status: Never  ? Smokeless tobacco: Never  ?Vaping Use  ? Vaping Use: Never used  ?Substance and Sexual Activity  ? Alcohol use: No  ? Drug use: No  ? Sexual activity: Yes  ?  Partners: Male  ?  Birth control/protection: Surgical  ?Other Topics Concern  ? Not on file  ?Social History Narrative  ? Lives at home with husband and three children.  ? Right-handed.  ? One cup caffeine per day.  ? ?Social Determinants of Health  ? ?Financial Resource Strain: Not on file  ?Food Insecurity: Not on file  ?Transportation Needs: Not on file  ?Physical Activity: Not on file  ?Stress: Not on file  ?Social Connections: Not on file  ?Intimate Partner Violence: Not on file  ? ? ?Review of Systems: ?See HPI, otherwise negative ROS ? ?Physical Exam: ?BP 110/75   Pulse 99   Temp (!) 97.5 ?F (36.4 ?C) (Temporal)   Resp (!) 22   Ht 5' (1.524 m)   Wt  68 kg   LMP  (LMP Unknown)   SpO2 99%   BMI 29.29 kg/m?  ?General:   Alert,  pleasant and cooperative in NAD ?Head:  Normocephalic and atraumatic. ?Neck:  Supple; no masses or thyromegaly. ?Lungs:  Clear throughout to auscultation.    ?Heart:  Regular rate and rhythm. ?Abdomen:  Soft, nontender and nondistended. Normal bowel sounds, without guarding, and without rebound.   ?Neurologic:  Alert and  oriented x4;  grossly normal neurologically. ? ?Impression/Plan: ?Nicole Hartman is here for an endoscopy and colonoscopy to be performed for pernicious anemia, rectal bleeding ? ?Risks, benefits, limitations, and alternatives regarding  endoscopy and colonoscopy have been reviewed with the patient.  Questions have been answered.  All parties agreeable. ? ? ?Lannette Donath, MD  02/14/2022, 10:22 AM ?

## 2022-02-15 ENCOUNTER — Encounter: Payer: Self-pay | Admitting: Gastroenterology

## 2022-02-18 DIAGNOSIS — D510 Vitamin B12 deficiency anemia due to intrinsic factor deficiency: Secondary | ICD-10-CM | POA: Diagnosis not present

## 2022-02-18 DIAGNOSIS — R200 Anesthesia of skin: Secondary | ICD-10-CM | POA: Diagnosis not present

## 2022-02-18 DIAGNOSIS — M25551 Pain in right hip: Secondary | ICD-10-CM | POA: Diagnosis not present

## 2022-02-18 DIAGNOSIS — R202 Paresthesia of skin: Secondary | ICD-10-CM | POA: Diagnosis not present

## 2022-02-18 DIAGNOSIS — N921 Excessive and frequent menstruation with irregular cycle: Secondary | ICD-10-CM | POA: Diagnosis not present

## 2022-02-18 LAB — SURGICAL PATHOLOGY

## 2022-02-20 ENCOUNTER — Telehealth: Payer: Self-pay

## 2022-02-20 DIAGNOSIS — R200 Anesthesia of skin: Secondary | ICD-10-CM | POA: Diagnosis not present

## 2022-02-20 NOTE — Telephone Encounter (Signed)
Patient verbalized understanding and made appointment  ?

## 2022-02-20 NOTE — Telephone Encounter (Signed)
-----   Message from Toney Reil, MD sent at 02/19/2022 10:32 PM EDT ----- ?Pathology results came back normal, please schedule hemorrhoid banding if pt is agreeable, ok to overbook ? ?Thanks ?RV ?

## 2022-02-22 ENCOUNTER — Encounter: Payer: Self-pay | Admitting: Physical Therapy

## 2022-02-22 ENCOUNTER — Ambulatory Visit: Payer: Medicaid Other | Attending: Obstetrics and Gynecology | Admitting: Physical Therapy

## 2022-02-22 DIAGNOSIS — R262 Difficulty in walking, not elsewhere classified: Secondary | ICD-10-CM | POA: Insufficient documentation

## 2022-02-22 DIAGNOSIS — M6281 Muscle weakness (generalized): Secondary | ICD-10-CM | POA: Diagnosis not present

## 2022-02-22 NOTE — Therapy (Signed)
?OUTPATIENT PHYSICAL THERAPY TREATMENT NOTE ? ? ?Patient Name: Nicole Hartman ?MRN: [id number] ?DOB:Apr 24, 1996, 26 y.o., female ?Today's Date: 02/22/2022 ? ?PCP: [facility] ?REFERRING PROVIDER: Fisher ? PT End of Session - 02/22/22 [id number]   ? ? Visit Number 21   ? Number of Visits 31   ? Date for PT Re-Evaluation 03/15/22   ? PT Start Time [id number]   ? PT Stop Time 1000   ? PT Time Calculation (min) 45 min   ? Activity Tolerance Patient tolerated treatment well   ? Behavior During Therapy [facility] for tasks assessed/performed   ? ?  ?  ? ?  ? ? ?Past Medical History:  ?Diagnosis Date  ? Anemia   ? Asthma   ? exercise induced  ? Cesarean delivery delivered 03/02/2016  ? Depression 2014  ? GERD (gastroesophageal reflux disease)   ? Headache   ? h/o migraines  ? PTSD (post-traumatic stress disorder) 2014  ? Thrombocytopenia ([place])   ? ?Past Surgical History:  ?Procedure Laterality Date  ? CESAREAN SECTION N/A 03/01/2016  ? Procedure: CESAREAN SECTION;  Surgeon: Will Bonnet, MD;  Location: [facility] ORS;  Service: Obstetrics;  Laterality: N/A;  ? CESAREAN SECTION N/A 02/11/2020  ? Procedure: CESAREAN SECTION;  Surgeon: Will Bonnet, MD;  Location: [facility] ORS;  Service: Obstetrics;  Laterality: N/A;  RNFA  ? CESAREAN SECTION WITH BILATERAL TUBAL LIGATION N/A 09/14/2021  ? Procedure: CESAREAN SECTION WITH BILATERAL TUBAL LIGATION;  Surgeon: Will Bonnet, MD;  Location: [facility] ORS;  Service: Obstetrics;  Laterality: N/A;  ? COLONOSCOPY WITH PROPOFOL N/A 02/14/2022  ? Procedure: COLONOSCOPY WITH PROPOFOL;  Surgeon: Lin Landsman, MD;  Location: [facility] ENDOSCOPY;  Service: Gastroenterology;  Laterality: N/A;  ? ESOPHAGOGASTRODUODENOSCOPY (EGD) WITH PROPOFOL N/A 02/14/2022  ? Procedure: ESOPHAGOGASTRODUODENOSCOPY (EGD) WITH PROPOFOL;  Surgeon: Lin Landsman, MD;  Location: [facility] ENDOSCOPY;  Service: Gastroenterology;  Laterality: N/A;  ? ?Patient Active Problem List  ? Diagnosis  Date Noted  ? Rectal bleeding   ? Polyp of sigmoid colon   ? Pernicious anemia 11/13/2021  ? History of cesarean delivery 09/14/2021  ? Back pain affecting pregnancy in third trimester 08/27/2021  ? B12 deficiency 07/13/2021  ? Thrombocytopenia affecting pregnancy, antepartum ([place]) 07/10/2021  ? Depressed mood 06/14/2021  ? History of cesarean delivery affecting pregnancy 06/14/2021  ? Prenatal care, subsequent pregnancy, second trimester 02/23/2021  ? Supervision of high risk pregnancy, antepartum 07/02/2019  ? PTSD (post-traumatic stress disorder) 01/08/2013  ? MDD (major depressive disorder), recurrent episode, severe ([place]) 01/08/2013  ? ? ?REFERRING DIAG: T14.8XXA (ICD-10-CM) - Nerve injury ?R26.2 (ICD-10-CM) - Difficulty walking ? ?THERAPY DIAG:  ?Muscle weakness (generalized) ? ?Difficulty in walking, not elsewhere classified ? ?PERTINENT HISTORY: 09/14/2021 c-section with resulting nerve injury and difficulty walking ? ?PRECAUTIONS: None ? ?SUBJECTIVE: Patient had good follow-up with new neurologist. Patient had profoundly negative experience with increased RLE symptoms with onset of menstruation. Patient does report that her RLE is doing better since starting nerve pain medication. Patient notes Global Rate of Change since starting PT is about 70%. Patient continues to have increased soreness and weakness with walking up and down stairs. ? ?PAIN:  ?Are you having pain? Yes: NPRS scale: 2/10 ?TREATMENT ? ?Neuromuscular Re-education: ?Total gym squats, hip width, x10 with coordinated TrA activation for postural control.TCs for improved motor control ?Total gym squats, plie position, x10 with coordinated TrA activation for postural control.TCs for improved motor control ?Total gym squats IR  with coordinated TrA activation for postural control, x10 ?Total gym, sidelying SLS squat, with coordinated TrA activation for postural control x10, each for improved knee control ?Total gym, Pilates semi-circle, both  directions, x4 for improved postural stability ?Total gym, Pilates lat pull down with legs in tabletop, mod/min A for maintaining leg position ?Total gym, Pilates coordination prep, mod/min A ?Standing calf raises with eversion isometric in neutral ankle posture for improved motor control at lower leg ? ?Patient educated throughout session on appropriate technique and form using multi-modal cueing, HEP, and activity modification. Patient articulated understanding and returned demonstration. ? ?Patient Response to interventions: ?No concerns ? ? ? PT Long Term Goals   ? ?  ? PT LONG TERM GOAL #1  ? Title Patient will be independent with HEP in order to improve strength and balance in order to decrease fall risk and improve function at home and in the community.   ? Baseline IE: not initiated; 2/7: IND   ? Time 6   ? Period Weeks   ? Status Achieved   ? Target Date 11/15/21   ?  ? PT LONG TERM GOAL #2  ? Title Patient will decrease 5TSTS to < 12 seconds without UE support nor compensatory patterns in order to demonstrate clinically significant improvement in LE strength and to approach peer normative values.   ? Baseline IE: 63.4 sec (LLE bias); 2/7: intermittent UE support no bias 45.5 sec, LLE bias 20.5 sec; 3/8: 14.54 sec, UE support, no bias; 4/26: 10.96 sec, no UE support, no bias  ? Time 6   ? Period Weeks   ? Status Achieved  ? Target Date 01/30/22   ?  ? PT LONG TERM GOAL #3  ? Title Patient will decrease TUG to below 14 seconds/decrease in order to demonstrate decreased fall risk.   ? Baseline IE: assess at next visit   ? Time 6   ? Period Weeks   ? Status Deferred   ?  ? PT LONG TERM GOAL #4  ? Title Patient will increase 6MWT by at least 18m (111ft) in order to demonstrate clinically significant improvement in cardiopulmonary endurance and community ambulation   ? Baseline IE: assess at next visit; 12/27: 2 min walk test: 56 ft, 5/10 pain (burning), RW, SBA, 2/7: deferred; 2/16: 538 ft, no AD, IND, 2/10  pain; 3/8: 784 ft, no AD, IND, 5/10 pain; 4/26: 1140 ft, no AD, IND, 3/10  ? Time 6   ? Period Weeks   ? Status On-going   ? Target Date 03/15/2022   ?  ? PT LONG TERM GOAL #5  ? Title Patient will demonstrate improved function as evidenced by a score of 68 on FOTO measure for full participation in activities at home and in the community.   ? Baseline IE: 50; 1/30: 58; 3/8: 64; 4/26: 69  ? Time 6   ? Period Weeks   ? Status Achieved  ? Target Date 01/30/22   ? ?  ?  ? ?  ? ? Plan   ? ? Clinical Impression Statement Patient presents to clinic with excellent motivation to participate in therapy. Patient continues to demonstrate deficits in RLE strength, gait, pain, and function. Patient able to tolerate intermediate Pilates postural/motor control interventions on total gym during today's session and responded positively to increased challenge. Patient will benefit from continued skilled therapeutic intervention to address remaining deficits in RLE strength, gait, pain, and function in order to increase function and improve overall  QOL.   ? Personal Factors and Comorbidities Comorbidity 3+;Time since onset of injury/illness/exacerbation   ? Comorbidities anemia, asthma, depression, GERD, migraines, PTSD   ? Examination-Activity Limitations Bathing;Hygiene/Grooming;Toileting;Squat;Sleep;Transfers;Dressing;Stairs;Bend;Lift   ? Examination-Participation Restrictions Community Activity;Occupation;Driving;Meal Prep;Cleaning;Shop;Laundry   ? Stability/Clinical Decision Making Evolving/Moderate complexity   ? Rehab Potential Good   ? PT Frequency 1x / week   ? PT Duration 6 weeks   ? PT Treatment/Interventions ADLs/Self Care Home Management;Cryotherapy;Electrical Stimulation;Moist Heat;Neuromuscular re-education;Therapeutic exercise;Stair training;Gait training;Patient/family education;Orthotic Fit/Training;Manual techniques;Scar mobilization;Taping;Joint Manipulations;Spinal Manipulations   ? PT Home Exercise Plan [id number];  [id number]   ? Consulted and Agree with Plan of Care Patient   ? ?  ?  ? ?  ? ? Myles Gip PT, DPT [phone]  ?02/22/2022, 9:19 AM ? ?  ? ?

## 2022-02-26 ENCOUNTER — Other Ambulatory Visit: Payer: Self-pay

## 2022-02-26 ENCOUNTER — Ambulatory Visit (INDEPENDENT_AMBULATORY_CARE_PROVIDER_SITE_OTHER): Payer: Medicaid Other | Admitting: Gastroenterology

## 2022-02-26 ENCOUNTER — Encounter: Payer: Self-pay | Admitting: Gastroenterology

## 2022-02-26 VITALS — BP 116/76 | HR 69 | Temp 98.0°F | Ht 60.0 in | Wt 150.1 lb

## 2022-02-26 DIAGNOSIS — R200 Anesthesia of skin: Secondary | ICD-10-CM | POA: Insufficient documentation

## 2022-02-26 DIAGNOSIS — K641 Second degree hemorrhoids: Secondary | ICD-10-CM | POA: Diagnosis not present

## 2022-02-26 NOTE — Progress Notes (Signed)
Arlyss Repressohini R Drinda Belgard, MD ?[address]  ?[address]  ?[place], [place] [address]  ?Main: [phone]  ?Fax: [phone] ? ? ? ?Gastroenterology Consultation ? ?Referring Provider:     [facility] ?Primary Care Physician:  [facility], [facility] ?Primary Gastroenterologist:  Dr. Arlyss Repressohini R Rane Blitch ?Reason for Consultation:     Pernicious anemia ?      ? HPI:   ?Nicole KickCecilia N Hartman is a 26 y.o. female referred by [facility], [facility]  for consultation & management of pernicious anemia.  Patient has history of chronic iron deficiency anemia, also has severe B12 deficiency anemia, found to have elevated intrinsic factor and antiparietal cell antibodies based on the labs from January 2023.  Patient reports that she has been anemic as a child and worsened during her pregnancies.  She has a 1422-month-old infant at home.  She received iron infusions and B12 replacement.  Currently, her iron and B12 deficiency anemia have resolved.  Most recent labs revealed hemoglobin 12.7, normal MCV, normal platelets, ferritin 48, B12 549.  Patient also had history of menorrhagia. ? ?Patient also reports irregular bowel habits, abdominal bloating and sometimes rectal bleeding on wiping.  She does report to severe rectal pain during defecation ? ?Follow-up visit 02/26/2022 ?Patient is here for her hemorrhoid banding.  She reports that over the weekend, she had severe constipation for which she took MiraLAX and finally had a bowel movement that resulted in rectal bleeding.  Her bowel movements are currently formed.  She has been applying nitroglycerin with lidocaine per rectum twice daily which she thinks is helping with rectal pain.  She underwent upper endoscopy and colonoscopy which were unremarkable. ? ?NSAIDs: None ? ?Antiplts/Anticoagulants/Anti thrombotics: None ? ?GI Procedures:  ?EGD and colonoscopy 02/14/2022 ?- Normal gastroesophageal junction and esophagus. ?- Esophagogastric landmarks  identified. ?- Normal stomach. Biopsied. ?- Normal duodenal bulb and examined duodenum. ? ?- The examined portion of the ileum was normal. ?- One 4 mm polyp at the recto-sigmoid colon, removed with a cold snare. Resected and retrieved. ?- Non-bleeding external hemorrhoids. ? ?- The examined portion of the ileum was normal. ?- One 4 mm polyp at the recto-sigmoid colon, removed with a cold snare. Resected and retrieved. ?- Non-bleeding external hemorrhoids. ? ?She denies family history of GI malignancy ? ?Past Medical History:  ?Diagnosis Date  ? Anemia   ? Asthma   ? exercise induced  ? Cesarean delivery delivered 03/02/2016  ? Depression 2014  ? GERD (gastroesophageal reflux disease)   ? Headache   ? h/o migraines  ? PTSD (post-traumatic stress disorder) 2014  ? Thrombocytopenia (HCC)   ? ? ?Past Surgical History:  ?Procedure Laterality Date  ? CESAREAN SECTION N/A 03/01/2016  ? Procedure: CESAREAN SECTION;  Surgeon: Conard NovakStephen D Jackson, MD;  Location: [facility] ORS;  Service: Obstetrics;  Laterality: N/A;  ? CESAREAN SECTION N/A 02/11/2020  ? Procedure: CESAREAN SECTION;  Surgeon: Conard NovakJackson, Stephen D, MD;  Location: [facility];  Service: Obstetrics;  Laterality: N/A;  RNFA  ? CESAREAN SECTION WITH BILATERAL TUBAL LIGATION N/A 09/14/2021  ? Procedure: CESAREAN SECTION WITH BILATERAL TUBAL LIGATION;  Surgeon: Conard NovakJackson, Stephen D, MD;  Location: [facility];  Service: Obstetrics;  Laterality: N/A;  ? COLONOSCOPY WITH PROPOFOL N/A 02/14/2022  ? Procedure: COLONOSCOPY WITH PROPOFOL;  Surgeon: Toney ReilVanga, Latriece Anstine Reddy, MD;  [facility];  Service: Gastroenterology;  Laterality: N/A;  ? ESOPHAGOGASTRODUODENOSCOPY (EGD) WITH PROPOFOL N/A 02/14/2022  ? Procedure: ESOPHAGOGASTRODUODENOSCOPY (EGD) WITH PROPOFOL;  Surgeon: Lannette DonathVanga, Fatou Dunnigan  Betti Cruz, MD;  Location: [facility] END[facility]COPY;  Service: Gastroenterology;  Laterality: N/A;  ? ?Current Outpatient Medications:  ?  calcium carbonate (OS-CAL) 1250 (500 Ca) MG chewable tablet, Chew by mouth., Disp:  , Rfl:  ?  Cobalamin Combinations (B-12) [phone] MCG SUBL, , Disp: , Rfl:  ?  cyclobenzaprine (FLEXERIL) 5 MG tablet, , Disp: , Rfl:  ?  ferrous sulfate (FER-IN-SOL) 75 (15 Fe) MG/ML SOLN, Take 1 tablet by mouth daily., Disp: , Rfl:  ?  Ferrous Sulfate (IRON PO), Take 1 tablet by mouth daily., Disp: , Rfl:  ?  fluticasone (FLONASE) 50 MCG/ACT nasal spray, SHAKE LIQUID AND USE 2 SPRAYS IN EACH NOSTRIL DAILY, Disp: , Rfl:  ?  gabapentin (NEURONTIN) 300 MG capsule, Take 300 mg by mouth daily., Disp: , Rfl:  ?  ipratropium (ATROVENT) 0.06 % nasal spray, Place into the nose., Disp: , Rfl:  ?  medroxyPROGESTERone (PROVERA) 10 MG tablet, Take by mouth 2 (two) times daily., Disp: , Rfl:  ?  Prenatal Vit-Fe Fumarate-FA (PNV PRENATAL PLUS MULTIVITAMIN) 27-1 MG TABS, Take 1 tablet by mouth daily., Disp: , Rfl:  ? ? ? ?Family History  ?Problem Relation Age of Onset  ? Healthy Mother   ? Healthy Father   ? Cancer - Colon Maternal Grandfather   ? Breast cancer Paternal Grandmother   ?     40s  ? Breast cancer Maternal Aunt   ?     not sure of age  ?  ? ?Social History  ? ?Tobacco Use  ? Smoking status: Never  ? Smokeless tobacco: Never  ?Vaping Use  ? Vaping Use: Never used  ?Substance Use Topics  ? Alcohol use: No  ? Drug use: No  ? ? ?Allergies as of 02/26/2022  ? (No Known Allergies)  ? ? ?Review of Systems:    ?All systems reviewed and negative except where noted in HPI. ? ? Physical Exam:  ?BP 116/76 (BP Location: Left Arm, Patient Position: Sitting, Cuff Size: Normal)   Pulse 69   Temp 98 ?F (36.7 ?C) (Oral)   Ht 5' (1.524 m)   Wt 150 lb 2 oz (68.1 kg)   BMI 29.32 kg/m?  ?No LMP recorded. ? ?General:   Alert,  Well-developed, well-nourished, pleasant and cooperative in NAD ?Head:  Normocephalic and atraumatic. ?Eyes:  Sclera clear, no icterus.   Conjunctiva pink. ?Ears:  Normal auditory acuity. ?Nose:  No deformity, discharge, or lesions. ?Mouth:  No deformity or lesions,oropharynx pink & moist. ?Neck:  Supple; no  masses or thyromegaly. ?Lungs:  Respirations even and unlabored.  Clear throughout to auscultation.   No wheezes, crackles, or rhonchi. No acute distress. ?Heart:  Regular rate and rhythm; no murmurs, clicks, rubs, or gallops. ?Abdomen:  Normal bowel sounds. Soft, non-tender and non-distended without masses, hepatosplenomegaly or hernias noted.  No guarding or rebound tenderness.   ?Rectal: Normal perianal skin, nontender digital rectal exam ?Msk:  Symmetrical without gross deformities. Good, equal movement & strength bilaterally. ?Pulses:  Normal pulses noted. ?Extremities:  No clubbing or edema.  No cyanosis. ?Neurologic:  Alert and oriented x3;  grossly normal neurologically. ?Skin:  Intact without significant lesions or rashes. No jaundice. ?Psych:  Alert and cooperative. Normal mood and affect. ? ?Imaging Studies: ?No abdominal imaging ? ?Assessment and Plan:  ? ?EMMAKATE HYPES is a 26 y.o. pleasant female with history of chronic constipation with abdominal bloating, chronic iron and B12 deficiency anemia secondary to pernicious anemia ? ?Pernicious anemia ?Currently, anemia  has resolved with normal iron and B12 levels ?EGD with biopsies were unremarkable ? ?Chronic constipation ?Reiterated on high-fiber diet, information provided ?Continue MiraLAX daily ?Also, advised to start taking fiber supplement such as Benefiber or fiber choice ? ?Chronic posterior anal fissure: Improving ?Continue 0.125% nitroglycerin with 5% lidocaine, instructions provided ? ?Rectal bleeding secondary to grade 2 hemorrhoids ?Colonoscopy was unremarkable except for hemorrhoids ?Discussed about outpatient hemorrhoid banding, risks and benefits, consent obtained ?Perform hemorrhoid ligation today ? ? ?Follow up in 2 weeks ? ?Arlyss Repress, MD ? ?

## 2022-02-26 NOTE — Progress Notes (Signed)

## 2022-02-27 ENCOUNTER — Ambulatory Visit: Payer: Medicaid Other | Admitting: Obstetrics and Gynecology

## 2022-03-01 ENCOUNTER — Ambulatory Visit: Payer: Medicaid Other | Admitting: Physical Therapy

## 2022-03-01 ENCOUNTER — Encounter: Payer: Self-pay | Admitting: Physical Therapy

## 2022-03-01 DIAGNOSIS — R262 Difficulty in walking, not elsewhere classified: Secondary | ICD-10-CM

## 2022-03-01 DIAGNOSIS — M6281 Muscle weakness (generalized): Secondary | ICD-10-CM

## 2022-03-01 NOTE — Therapy (Signed)
OUTPATIENT PHYSICAL THERAPY TREATMENT NOTE   Patient Name: Nicole Hartman MRN: [id number] DOB:Nov 15, 1995, 26 y.o., female Today's Date: 03/01/2022  [facility]P: [facility] REFERRING PROVIDER: [facility], [facility]   PT End of Session - 03/01/22 1044     Visit Number 22    Number of Visits 31    Date for PT Re-Evaluation 03/15/22    PT Start Time 0930    PT Stop Time 1010    PT Time Calculation (min) 40 min    Activity Tolerance Patient tolerated treatment well    Behavior During Therapy [facility] for tasks assessed/performed             Past Medical History:  Diagnosis Date   Anemia    Asthma    exercise induced   Cesarean delivery delivered 03/02/2016   Depression 2014   GERD (gastroesophageal reflux disease)    Headache    h/o migraines   PTSD (post-traumatic stress disorder) 2014   Thrombocytopenia (HCC)    Past Surgical History:  Procedure Laterality Date   CESAREAN SECTION N/A 03/01/2016   Procedure: CESAREAN SECTION;  Surgeon: Conard NovakStephen D Jackson, MD;  Location: [facility];  Service: Obstetrics;  Laterality: N/A;   CESAREAN SECTION N/A 02/11/2020   Procedure: CESAREAN SECTION;  Surgeon: Conard NovakJackson, Stephen D, MD;  Location: [facility];  Service: Obstetrics;  Laterality: N/A;  RNFA   CESAREAN SECTION WITH BILATERAL TUBAL LIGATION N/A 09/14/2021   Procedure: CESAREAN SECTION WITH BILATERAL TUBAL LIGATION;  Surgeon: Conard NovakJackson, Stephen D, MD;  Location: [facility];  Service: Obstetrics;  Laterality: N/A;   COLONOSCOPY WITH PROPOFOL N/A 02/14/2022   Procedure: COLONOSCOPY WITH PROPOFOL;  Surgeon: Toney ReilVanga, Rohini Reddy, MD;  [facility];  Service: Gastroenterology;  Laterality: N/A;   ESOPHAGOGASTRODUODENOSCOPY (EGD) WITH PROPOFOL N/A 02/14/2022   Procedure: ESOPHAGOGASTRODUODENOSCOPY (EGD) WITH PROPOFOL;  Surgeon: Toney ReilVanga, Rohini Reddy, MD;  [facility];  Service: Gastroenterology;  Laterality: N/A;   Patient Active Problem List   Diagnosis  Date Noted   Numbness 02/26/2022   Rectal bleeding    Polyp of sigmoid colon    Pernicious anemia 11/13/2021   History of cesarean delivery 09/14/2021   Back pain affecting pregnancy in third trimester 08/27/2021   B12 deficiency 07/13/2021   Thrombocytopenia affecting pregnancy, antepartum (HCC) 07/10/2021   Depressed mood 06/14/2021   History of cesarean delivery affecting pregnancy 06/14/2021   Prenatal care, subsequent pregnancy, second trimester 02/23/2021   Supervision of high risk pregnancy, antepartum 07/02/2019   PTSD (post-traumatic stress disorder) 01/08/2013   MDD (major depressive disorder), recurrent episode, severe (HCC) 01/08/2013    REFERRING DIAG: T14.8XXA (ICD-10-CM) - Nerve injury R26.2 (ICD-10-CM) - Difficulty walking  THERAPY DIAG:  Muscle weakness (generalized)  Difficulty in walking, not elsewhere classified  PERTINENT HISTORY: 09/14/2021 c-section with resulting nerve injury and difficulty walking  PRECAUTIONS: None  SUBJECTIVE: Patient had good follow-up with new neurologist. Patient had profoundly negative experience with increased RLE symptoms with onset of menstruation. Patient does report that her RLE is doing better since starting nerve pain medication. Patient notes Global Rate of Change since starting PT is about 70%. Patient continues to have increased soreness and weakness with walking up and down stairs.  PAIN:  Are you having pain? Yes: NPRS scale: 2/10 TREATMENT  Neuromuscular Re-education: Total gym squats, hip width, x10 with coordinated TrA activation for postural control.TCs for improved motor control Total gym squats, plie position, x10 with coordinated TrA activation for postural control.TCs for improved motor control  Total gym squats IR with coordinated TrA activation for postural control, x10 Total gym, sidelying SLS squat, with coordinated TrA activation for postural control x10, each for improved knee control Total gym, quadruped  hip extension, B, with coordinated TrA activation for postural control, x10 Nautilus resisted gait, 50# on column, x1 length, 40# on column, x2 lengths  Patient educated throughout session on appropriate technique and form using multi-modal cueing, HEP, and activity modification. Patient articulated understanding and returned demonstration.  Patient Response to interventions: Notes fatigue in RLE    PT Long Term Goals       PT LONG TERM GOAL #1   Title Patient will be independent with HEP in order to improve strength and balance in order to decrease fall risk and improve function at home and in the community.    Baseline IE: not initiated; 2/7: IND    Time 6    Period Weeks    Status Achieved    Target Date 11/15/21      PT LONG TERM GOAL #2   Title Patient will decrease 5TSTS to < 12 seconds without UE support nor compensatory patterns in order to demonstrate clinically significant improvement in LE strength and to approach peer normative values.    Baseline IE: 63.4 sec (LLE bias); 2/7: intermittent UE support no bias 45.5 sec, LLE bias 20.5 sec; 3/8: 14.54 sec, UE support, no bias; 4/26: 10.96 sec, no UE support, no bias   Time 6    Period Weeks    Status Achieved   Target Date 01/30/22      PT LONG TERM GOAL #3   Title Patient will decrease TUG to below 14 seconds/decrease in order to demonstrate decreased fall risk.    Baseline IE: assess at next visit    Time 6    Period Weeks    Status Deferred      PT LONG TERM GOAL #4   Title Patient will increase by at least 23m (123ft) in order to demonstrate clinically significant improvement in cardiopulmonary endurance and community ambulation    Baseline IE: assess at next visit; 12/27: 2 min walk test: 56 ft, 5/10 pain (burning), RW, SBA, 2/7: deferred; 2/16: 538 ft, no AD, IND, 2/10 pain; 3/8: 784 ft, no AD, IND, 5/10 pain; 4/26: 1140 ft, no AD, IND, 3/10   Time 6    Period Weeks    Status On-going    Target Date  03/15/2022      PT LONG TERM GOAL #5   Title Patient will demonstrate improved function as evidenced by a score of 68 on FOTO measure for full participation in activities at home and in the community.    Baseline IE: 50; 1/30: 58; 3/8: 64; 4/26: 69   Time 6    Period Weeks    Status Achieved   Target Date 01/30/22             Plan     Clinical Impression Statement Patient presents to clinic with excellent motivation to participate in therapy. Patient continues to demonstrate deficits in RLE strength, gait, pain, and function. Patient gait much more symmetrical and normalized on arrival to clinic and had greater tolerance to resistance and control interventions during today's session and responded positively to increased challenge. Patient will benefit from continued skilled therapeutic intervention to address remaining deficits in RLE strength, gait, pain, and function in order to increase function and improve overall QOL.    Personal Factors and Comorbidities Comorbidity 3+;Time  since onset of injury/illness/exacerbation    Comorbidities anemia, asthma, depression, GERD, migraines, PTSD    Examination-Activity Limitations Bathing;Hygiene/Grooming;Toileting;Squat;Sleep;Transfers;Dressing;Stairs;Bend;Lift    Examination-Participation Restrictions Community Activity;Occupation;Driving;Meal Prep;Cleaning;Shop;Laundry    Stability/Clinical Decision Making Evolving/Moderate complexity    Rehab Potential Good    PT Frequency 1x / week    PT Duration 6 weeks    PT Treatment/Interventions ADLs/Self Care Home Management;Cryotherapy;Electrical Stimulation;Moist Heat;Neuromuscular re-education;Therapeutic exercise;Stair training;Gait training;Patient/family education;Orthotic Fit/Training;Manual techniques;Scar mobilization;Taping;Joint Manipulations;Spinal Manipulations    PT Home Exercise Plan [email or web address]; [id number]    Consulted and Agree with Plan of Care Patient             Sheria Lang PT,  DPT [phone]  03/01/2022, 10:45 AM

## 2022-03-06 DIAGNOSIS — N921 Excessive and frequent menstruation with irregular cycle: Secondary | ICD-10-CM | POA: Diagnosis not present

## 2022-03-06 DIAGNOSIS — R200 Anesthesia of skin: Secondary | ICD-10-CM | POA: Diagnosis not present

## 2022-03-06 DIAGNOSIS — M25551 Pain in right hip: Secondary | ICD-10-CM | POA: Diagnosis not present

## 2022-03-07 ENCOUNTER — Ambulatory Visit: Payer: Medicaid Other | Admitting: Obstetrics and Gynecology

## 2022-03-07 ENCOUNTER — Ambulatory Visit: Payer: Medicaid Other | Admitting: Physical Therapy

## 2022-03-07 DIAGNOSIS — R262 Difficulty in walking, not elsewhere classified: Secondary | ICD-10-CM

## 2022-03-07 DIAGNOSIS — M6281 Muscle weakness (generalized): Secondary | ICD-10-CM

## 2022-03-08 ENCOUNTER — Encounter: Payer: Medicaid Other | Admitting: Physical Therapy

## 2022-03-08 ENCOUNTER — Encounter: Payer: Self-pay | Admitting: Physical Therapy

## 2022-03-08 NOTE — Therapy (Signed)
OUTPATIENT PHYSICAL THERAPY TREATMENT NOTE   Patient Name: Nicole Hartman MRN: [id number] DOB:[birth year], 26 y.o., female Today's Date: 03/08/2022  PCP: [facility], [facility] REFERRING PROVIDER: [facility], [facility]   Nicholas End of Session - 03/08/22 5462     Visit Number 23    Number of Visits 31    Date for PT Re-Evaluation 03/15/22    PT Start Time 1545    PT Stop Time 1630    PT Time Calculation (min) 45 min    Activity Tolerance Patient tolerated treatment well    Behavior During Therapy [facility] for tasks assessed/performed             Past Medical History:  Diagnosis Date   Anemia    Asthma    exercise induced   Cesarean delivery delivered 03/02/2016   Depression 2014   GERD (gastroesophageal reflux disease)    Headache    h/o migraines   PTSD (post-traumatic stress disorder) 2014   Thrombocytopenia (HCC)    Past Surgical History:  Procedure Laterality Date   CESAREAN SECTION N/A 03/01/2016   Procedure: CESAREAN SECTION;  Surgeon: Conard Novak, MD;  Location: [facility];  Service: Obstetrics;  Laterality: N/A;   CESAREAN SECTION N/A 02/11/2020   Procedure: CESAREAN SECTION;  Surgeon: Conard Novak, MD;  Location: [facility];  Service: Obstetrics;  Laterality: N/A;  RNFA   CESAREAN SECTION WITH BILATERAL TUBAL LIGATION N/A 09/14/2021   Procedure: CESAREAN SECTION WITH BILATERAL TUBAL LIGATION;  Surgeon: Conard Novak, MD;  Location: [facility];  Service: Obstetrics;  Laterality: N/A;   COLONOSCOPY WITH PROPOFOL N/A 02/14/2022   Procedure: COLONOSCOPY WITH PROPOFOL;  Surgeon: Toney Reil, MD;  Location[facility]COPY;  Service: Gastroenterology;  Laterality: N/A;   ESOPHAGOGASTRODUODENOSCOPY (EGD) WITH PROPOFOL N/A 02/14/2022   Procedure: ESOPHAGOGASTRODUODENOSCOPY (EGD) WITH PROPOFOL;  Surgeon: Toney Reil, MD;  Location: [facility] ENDOSCOPY;  Service: Gastroenterology;  Laterality: N/A;   Patient Active Problem List   Diagnosis  Date Noted   Numbness 02/26/2022   Rectal bleeding    Polyp of sigmoid colon    Pernicious anemia 11/13/2021   History of cesarean delivery 09/14/2021   Back pain affecting pregnancy in third trimester 08/27/2021   B12 deficiency 07/13/2021   Thrombocytopenia affecting pregnancy, antepartum (HCC) 07/10/2021   Depressed mood 06/14/2021   History of cesarean delivery affecting pregnancy 06/14/2021   Prenatal care, subsequent pregnancy, second trimester 02/23/2021   Supervision of high risk pregnancy, antepartum 07/02/2019   PTSD (post-traumatic stress disorder) 01/08/2013   MDD (major depressive disorder), recurrent episode, severe (HCC) 01/08/2013    REFERRING DIAG: T14.8XXA (ICD-10-CM) - Nerve injury R26.2 (ICD-10-CM) - Difficulty walking  THERAPY DIAG:  Muscle weakness (generalized)  Difficulty in walking, not elsewhere classified  PERTINENT HISTORY: 09/14/2021 c-section with resulting nerve injury and difficulty walking  PRECAUTIONS: None  SUBJECTIVE: Patient notes medications prescribed by neurology have been helpful with pain management. She does continue to note improvements although slow and modest. Patient has been   PAIN:  Are you having pain? Yes: NPRS scale: 0/10 TREATMENT  Neuromuscular Re-education: Total gym squats, hip width, x10 with coordinated TrA activation for postural control.TCs for improved motor control Total gym SLS squats supine, x10 with coordinated TrA activation for postural control.TCs for improved motor control Total gym, sidelying SLS squat, with coordinated TrA activation for postural control x10, each for improved knee control Total gym, mermaid stretch, B,  for postural awareness and foraminal gapping/neural mobility, x5 Total gym,  Eve's lunge, B, for femoral nerve mobility, x5 Total gym, scooter push, R, for improved hip extension and femoral nerve desensitization, x10 Nautilus resisted gait, 40# on column, x4 lengths  Patient educated  throughout session on appropriate technique and form using multi-modal cueing, HEP, and activity modification. Patient articulated understanding and returned demonstration.  Patient Response to interventions: Notes fatigue in RLE    PT Long Term Goals       PT LONG TERM GOAL #1   Title Patient will be independent with HEP in order to improve strength and balance in order to decrease fall risk and improve function at home and in the community.    Baseline IE: not initiated; 2/7: IND    Time 6    Period Weeks    Status Achieved    Target Date 11/15/21      PT LONG TERM GOAL #2   Title Patient will decrease 5TSTS to < 12 seconds without UE support nor compensatory patterns in order to demonstrate clinically significant improvement in LE strength and to approach peer normative values.    Baseline IE: 63.4 sec (LLE bias); 2/7: intermittent UE support no bias 45.5 sec, LLE bias 20.5 sec; 3/8: 14.54 sec, UE support, no bias; 4/26: 10.96 sec, no UE support, no bias   Time 6    Period Weeks    Status Achieved   Target Date 01/30/22      PT LONG TERM GOAL #3   Title Patient will decrease TUG to below 14 seconds/decrease in order to demonstrate decreased fall risk.    Baseline IE: assess at next visit    Time 6    Period Weeks    Status Deferred      PT LONG TERM GOAL #4   Title Patient will increase by at least 58m (142ft) in order to demonstrate clinically significant improvement in cardiopulmonary endurance and community ambulation    Baseline IE: assess at next visit; 12/27: 2 min walk test: 56 ft, 5/10 pain (burning), RW, SBA, 2/7: deferred; 2/16: 538 ft, no AD, IND, 2/10 pain; 3/8: 784 ft, no AD, IND, 5/10 pain; 4/26: 1140 ft, no AD, IND, 3/10   Time 6    Period Weeks    Status On-going    Target Date 03/15/2022      PT LONG TERM GOAL #5   Title Patient will demonstrate improved function as evidenced by a score of 68 on FOTO measure for full participation in activities at home  and in the community.    Baseline IE: 50; 1/30: 58; 3/8: 64; 4/26: 69   Time 6    Period Weeks    Status Achieved   Target Date 01/30/22             Plan     Clinical Impression Statement Patient presents to clinic with excellent motivation to participate in therapy. Patient continues to demonstrate deficits in RLE strength, gait, pain, and function although improving. Patient able to tolerate resisted gait with better symmetry and for increased duration as compared to last session and responded positively to active interventions. Patient will benefit from continued skilled therapeutic intervention to address remaining deficits in RLE strength, gait, pain, and function in order to increase function and improve overall QOL.    Personal Factors and Comorbidities Comorbidity 3+;Time since onset of injury/illness/exacerbation    Comorbidities anemia, asthma, depression, GERD, migraines, PTSD    Examination-Activity Limitations Bathing;Hygiene/Grooming;Toileting;Squat;Sleep;Transfers;Dressing;Stairs;Bend;Lift    Examination-Participation Restrictions Community Activity;Occupation;Driving;Meal Prep;Cleaning;Shop;Laundry  Stability/Clinical Decision Making Evolving/Moderate complexity    Rehab Potential Good    PT Frequency 1x / week    PT Duration 6 weeks    PT Treatment/Interventions ADLs/Self Care Home Management;Cryotherapy;Electrical Stimulation;Moist Heat;Neuromuscular re-education;Therapeutic exercise;Stair training;Gait training;Patient/family education;Orthotic Fit/Training;Manual techniques;Scar mobilization;Taping;Joint Manipulations;Spinal Manipulations    PT Home Exercise Plan [email or web address]; [id number]    Consulted and Agree with Plan of Care Patient             Sheria LangKatlin Jalayia Bagheri PT, DPT [phone]  03/08/2022, 9:51 AM

## 2022-03-12 ENCOUNTER — Ambulatory Visit: Payer: Medicaid Other | Admitting: Obstetrics and Gynecology

## 2022-03-14 ENCOUNTER — Encounter: Payer: Self-pay | Admitting: Physical Therapy

## 2022-03-14 ENCOUNTER — Ambulatory Visit: Payer: Medicaid Other | Attending: Obstetrics and Gynecology | Admitting: Physical Therapy

## 2022-03-14 ENCOUNTER — Ambulatory Visit: Payer: Medicaid Other | Admitting: Gastroenterology

## 2022-03-14 DIAGNOSIS — R262 Difficulty in walking, not elsewhere classified: Secondary | ICD-10-CM | POA: Insufficient documentation

## 2022-03-14 DIAGNOSIS — R200 Anesthesia of skin: Secondary | ICD-10-CM | POA: Diagnosis not present

## 2022-03-14 DIAGNOSIS — M6281 Muscle weakness (generalized): Secondary | ICD-10-CM | POA: Insufficient documentation

## 2022-03-14 NOTE — Therapy (Unsigned)
OUTPATIENT PHYSICAL THERAPY TREATMENT NOTE   Patient Name: Nicole Hartman MRN: [id number] DOB:08/21/1996, 26 y.o., female Today's Date: 03/15/2022  PCP: [facility] [place] [facility] - 03/14/22 1630     Visit Number 24    Number of Visits 31    Date for PT Re-Evaluation 04/25/22    PT Start Time 1630    PT Stop Time 1710    PT Time Calculation (min) 40 min    Activity Tolerance Patient tolerated treatment well;Patient limited by pain    Behavior During Therapy [facility] for tasks assessed/performed             Past Medical History:  Diagnosis Date   Anemia    Asthma    exercise induced   Cesarean delivery delivered 03/02/2016   Depression 2014   GERD (gastroesophageal reflux disease)    Headache    h/o migraines   PTSD (post-traumatic stress disorder) 2014   Thrombocytopenia ([place])    Past Surgical History:  Procedure Laterality Date   CESAREAN SECTION N/A 03/01/2016   Procedure: CESAREAN SECTION;  Surgeon: Will Bonnet, MD;  Location: [facility] ORS;  Service: Obstetrics;  Laterality: N/A;   CESAREAN SECTION N/A 02/11/2020   Procedure: CESAREAN SECTION;  Surgeon: Will Bonnet, MD;  Location: [facility] ORS;  Service: Obstetrics;  Laterality: N/A;  RNFA   CESAREAN SECTION WITH BILATERAL TUBAL LIGATION N/A 09/14/2021   Procedure: CESAREAN SECTION WITH BILATERAL TUBAL LIGATION;  Surgeon: Will Bonnet, MD;  Location: [facility] ORS;  Service: Obstetrics;  Laterality: N/A;   COLONOSCOPY WITH PROPOFOL N/A 02/14/2022   Procedure: COLONOSCOPY WITH PROPOFOL;  Surgeon: Lin Landsman, MD;  Location: [facility];  Service: Gastroenterology;  Laterality: N/A;   ESOPHAGOGASTRODUODENOSCOPY (EGD) WITH PROPOFOL N/A 02/14/2022   Procedure: ESOPHAGOGASTRODUODENOSCOPY (EGD) WITH PROPOFOL;  Surgeon: Lin Landsman, MD;  Location: [facility] ENDOSCOPY;  Service: Gastroenterology;  Laterality: N/A;   Patient Active  Problem List   Diagnosis Date Noted   Numbness 02/26/2022   Rectal bleeding    Polyp of sigmoid colon    Pernicious anemia 11/13/2021   History of cesarean delivery 09/14/2021   Back pain affecting pregnancy in third trimester 08/27/2021   B12 deficiency 07/13/2021   Thrombocytopenia affecting pregnancy, antepartum ([place]) 07/10/2021   Depressed mood 06/14/2021   History of cesarean delivery affecting pregnancy 06/14/2021   Prenatal care, subsequent pregnancy, second trimester 02/23/2021   Supervision of high risk pregnancy, antepartum 07/02/2019   PTSD (post-traumatic stress disorder) 01/08/2013   MDD (major depressive disorder), recurrent episode, severe ([place]) 01/08/2013    REFERRING DIAG: T14.8XXA (ICD-10-CM) - Nerve injury R26.2 (ICD-10-CM) - Difficulty walking  THERAPY DIAG:  Muscle weakness (generalized)  Difficulty in walking, not elsewhere classified  PERTINENT HISTORY: 09/14/2021 c-section with resulting nerve injury and difficulty walking  PRECAUTIONS: None  SUBJECTIVE: Patient notes medications prescribed by neurology have been helpful with pain management. She does continue to note improvements although slow and modest. Patient has been   PAIN:  Are you having pain? Yes: NPRS scale: 5/10 TREATMENT  Neuromuscular Re-education: Reassessed goals; see below.  Patient education on body mechanics and seating modifications in car and at work for decreased compression of femoral nerve and its branches.    Patient educated throughout session on appropriate technique and form using multi-modal cueing, HEP, and activity modification. Patient articulated understanding and returned demonstration.  Patient Response to interventions: 7/10 after goal reassessment, cryotherapy brought pain to  4/10     PT Long Term Goals - 03/14/22 1639       PT LONG TERM GOAL #1   Title Patient will be independent with HEP in order to improve strength and balance in order to decrease fall  risk and improve function at home and in the community.    Baseline IE: not initiated; 2/7: IND    Time 6    Period Weeks    Status Achieved    Target Date 11/15/21      PT LONG TERM GOAL #2   Title Patient will decrease 5TSTS to < 12 seconds without UE support nor compensatory patterns in order to demonstrate clinically significant improvement in LE strength and to approach peer normative values.    Baseline IE: 63.4 sec (LLE bias); 2/7: intermittent UE support no bias 45.5 sec, LLE bias 20.5 sec; 3/8: 14.54 sec, UE support, no bias; 4/26: 10.96 sec, no UE support, no bias; 6/1: 9.82 sec, no UE support, no bias    Time 6    Period Weeks    Status Achieved    Target Date 01/30/22      PT LONG TERM GOAL #3   Title Patient will decrease TUG to below 14 seconds/decrease in order to demonstrate decreased fall risk.    Baseline IE: assess at next visit    Time 6    Period Weeks    Status Deferred      PT LONG TERM GOAL #4   Title Patient will increase 6MWT by at least 65m (183ft) in order to demonstrate clinically significant improvement in cardiopulmonary endurance and community ambulation    Baseline IE: assess at next visit; 12/27: 2 min walk test: 56 ft, 5/10 pain (burning), RW, SBA, 2/7: deferred; 2/16: 538 ft, no AD, IND, 2/10 pain; 3/8: 784 ft, no AD, IND, 5/10 pain; 4/26: 1140 ft, no AD, IND, 3/10; 6/1: 1224 ft, no AD, IND, 5/10 pain    Time 6    Period Weeks    Status Achieved    Target Date 01/30/22      PT LONG TERM GOAL #5   Title Patient will demonstrate improved function as evidenced by a score of 68 on FOTO measure for full participation in activities at home and in the community.    Baseline IE: 50; 1/30: 58; 3/8: 64; 4/26: 69; 6/1: 69    Time 6    Period Weeks    Status Achieved    Target Date 01/30/22      Additional Long Term Goals   Additional Long Term Goals Yes      PT LONG TERM GOAL #6   Title Patient will be able to participate in driving, dancing, and  jumping activities without pain or limitation to demonstrate a return to PLOF and return to full participation at home and in the community.    Baseline 6/1: 7/10 dancing, 10/10 driving    Time 4    Period Weeks    Status New    Target Date 04/11/22               Plan - 03/15/22 1023     Clinical Impression Statement Patient presents to clinic with excellent motivation to participate in therapy. During goal reassessment, patient continued to demonstrate progress toward achievement with all goals (see above/below), but continues to struggle with increasing function without an adverse pain reaction which then limits function on subsequent days. Since last visit, patient attempted to return to  PLOF and activity and had increased pain (10/10 with driving) and a return of edema in the inguinal region. The swelling took approximately 3-4 days to subside, but appears to return in response to increased activity or increased compression of inguinal region (ie. sitting postures, increased walking). Patient has been adherent to HEP and all recommendations made by physical therapy throughout the course of care. Because of patient's continued functional limitations compared to baseline and her peer group, further physical therapy intervention is warranted. Patient's condition has the potential to improve in response to therapy. Maximum improvement is yet to be obtained. Patient will benefit from continued skilled therapeutic intervention to address remaining deficits in RLE strength, gait, pain, and function in order to increase function and improve overall QOL.    Personal Factors and Comorbidities Comorbidity 3+;Time since onset of injury/illness/exacerbation    Comorbidities anemia, asthma, depression, GERD, migraines, PTSD    Examination-Activity Limitations Bathing;Hygiene/Grooming;Toileting;Squat;Sleep;Transfers;Dressing;Stairs;Bend;Lift    Examination-Participation Restrictions Community  Activity;Occupation;Driving;Meal Prep;Cleaning;Shop;Laundry    Stability/Clinical Decision Making Evolving/Moderate complexity    Rehab Potential Good    PT Frequency 1x / week    PT Duration 6 weeks    PT Treatment/Interventions ADLs/Self Care Home Management;Cryotherapy;Electrical Stimulation;Moist Heat;Neuromuscular re-education;Therapeutic exercise;Stair training;Gait training;Patient/family education;Orthotic Fit/Training;Manual techniques;Scar mobilization;Taping;Joint Manipulations;Spinal Manipulations    PT Home Exercise Plan [email or web address]; [id number]    Consulted and Agree with Plan of Care Patient               Myles Gip PT, DPT [phone]  03/15/2022, 10:23 AM

## 2022-03-15 ENCOUNTER — Encounter: Payer: Medicaid Other | Admitting: Physical Therapy

## 2022-03-21 ENCOUNTER — Encounter: Payer: Medicaid Other | Admitting: Physical Therapy

## 2022-03-28 ENCOUNTER — Ambulatory Visit: Payer: Medicaid Other | Admitting: Physical Therapy

## 2022-03-28 ENCOUNTER — Encounter: Payer: Self-pay | Admitting: Physical Therapy

## 2022-03-28 DIAGNOSIS — R262 Difficulty in walking, not elsewhere classified: Secondary | ICD-10-CM | POA: Diagnosis not present

## 2022-03-28 DIAGNOSIS — M6281 Muscle weakness (generalized): Secondary | ICD-10-CM | POA: Diagnosis not present

## 2022-03-28 NOTE — Therapy (Signed)
OUTPATIENT PHYSICAL THERAPY TREATMENT NOTE   Patient Name: Nicole Hartman MRN: [id number] DOB:1996-08-01, 26 y.o., female Today's Date: 03/28/2022  PCP: [facility] REFERRING PROVIDER: Conard Novak, MD   PT End of Session - 03/28/22 1744     Visit Number 25    Number of Visits 31    Date for PT Re-Evaluation 04/25/22    PT Start Time 1645    PT Stop Time 1730    PT Time Calculation (min) 45 min    Activity Tolerance Patient tolerated treatment well    Behavior During Therapy [facility] for tasks assessed/performed             Past Medical History:  Diagnosis Date   Anemia    Asthma    exercise induced   Cesarean delivery delivered 03/02/2016   Depression 2014   GERD (gastroesophageal reflux disease)    Headache    h/o migraines   PTSD (post-traumatic stress disorder) 2014   Thrombocytopenia ([facility])    Past Surgical History:  Procedure Laterality Date   CESAREAN SECTION N/A 03/01/2016   Procedure: CESAREAN SECTION;  Surgeon: Conard Novak, MD;  Location: [facility] ORS;  Service: Obstetrics;  Laterality: N/A;   CESAREAN SECTION N/A 02/11/2020   Procedure: CESAREAN SECTION;  Surgeon: Conard Novak, MD;  Location: [facility] ORS;  Service: Obstetrics;  Laterality: N/A;  RNFA   CESAREAN SECTION WITH BILATERAL TUBAL LIGATION N/A 09/14/2021   Procedure: CESAREAN SECTION WITH BILATERAL TUBAL LIGATION;  Surgeon: Conard Novak, MD;  Location: [facility] ORS;  Service: Obstetrics;  Laterality: N/A;   COLONOSCOPY WITH PROPOFOL N/A 02/14/2022   Procedure: COLONOSCOPY WITH PROPOFOL;  Surgeon: Toney Reil, MD;  Location: [facility] ENDOSCOPY;  Service: Gastroenterology;  Laterality: N/A;   ESOPHAGOGASTRODUODENOSCOPY (EGD) WITH PROPOFOL N/A 02/14/2022   Procedure: ESOPHAGOGASTRODUODENOSCOPY (EGD) WITH PROPOFOL;  Surgeon: Toney Reil, MD;  Location: [facility] ENDOSCOPY;  Service: Gastroenterology;  Laterality: N/A;   Patient Active Problem List   Diagnosis  Date Noted   Numbness 02/26/2022   Rectal bleeding    Polyp of sigmoid colon    Pernicious anemia 11/13/2021   History of cesarean delivery 09/14/2021   Back pain affecting pregnancy in third trimester 08/27/2021   B12 deficiency 07/13/2021   Thrombocytopenia affecting pregnancy, antepartum ([facility]) 07/10/2021   Depressed mood 06/14/2021   History of cesarean delivery affecting pregnancy 06/14/2021   Prenatal care, subsequent pregnancy, second trimester 02/23/2021   Supervision of high risk pregnancy, antepartum 07/02/2019   PTSD (post-traumatic stress disorder) 01/08/2013   MDD (major depressive disorder), recurrent episode, severe ([facility]) 01/08/2013    REFERRING DIAG: T14.8XXA (ICD-10-CM) - Nerve injury R26.2 (ICD-10-CM) - Difficulty walking  THERAPY DIAG:  Muscle weakness (generalized)  Difficulty in walking, not elsewhere classified  PERTINENT HISTORY: 09/14/2021 c-section with resulting nerve injury and difficulty walking  PRECAUTIONS: None  SUBJECTIVE: Patient continues to have increased swelling in RLE with increased activity. Patient participated in two workouts recently where friend/family member has remarked on swelling. Patient also continues to deal with increased pain in RLE. Was able to drive short distance recently without significant complaint.  PAIN:  Are you having pain? Yes: NPRS scale: 5/10 TREATMENT  Neuromuscular Re-education: Pilates based session including (Total Gym/reformer exercises): Footwork, parallel, 1st, and 2nd SL Footwork Sidelying footwork, B Knee stretch Scooter press, reverse, B Elephant Stomach massage, flat back    Patient educated throughout session on appropriate technique and form using multi-modal cueing, HEP, and activity modification. Patient articulated  understanding and returned demonstration.  Patient Response to interventions: 6/10    PT Long Term Goals - 03/14/22 1639       PT LONG TERM GOAL #1   Title Patient will  be independent with HEP in order to improve strength and balance in order to decrease fall risk and improve function at home and in the community.    Baseline IE: not initiated; 2/7: IND    Time 6    Period Weeks    Status Achieved    Target Date 11/15/21      PT LONG TERM GOAL #2   Title Patient will decrease 5TSTS to < 12 seconds without UE support nor compensatory patterns in order to demonstrate clinically significant improvement in LE strength and to approach peer normative values.    Baseline IE: 63.4 sec (LLE bias); 2/7: intermittent UE support no bias 45.5 sec, LLE bias 20.5 sec; 3/8: 14.54 sec, UE support, no bias; 4/26: 10.96 sec, no UE support, no bias; 6/1: 9.82 sec, no UE support, no bias    Time 6    Period Weeks    Status Achieved    Target Date 01/30/22      PT LONG TERM GOAL #3   Title Patient will decrease TUG to below 14 seconds/decrease in order to demonstrate decreased fall risk.    Baseline IE: assess at next visit    Time 6    Period Weeks    Status Deferred      PT LONG TERM GOAL #4   Title Patient will increase by at least 17m (16ft) in order to demonstrate clinically significant improvement in cardiopulmonary endurance and community ambulation    Baseline IE: assess at next visit; 12/27: 2 min walk test: 56 ft, 5/10 pain (burning), RW, SBA, 2/7: deferred; 2/16: 538 ft, no AD, IND, 2/10 pain; 3/8: 784 ft, no AD, IND, 5/10 pain; 4/26: 1140 ft, no AD, IND, 3/10; 6/1: 1224 ft, no AD, IND, 5/10 pain    Time 6    Period Weeks    Status Achieved    Target Date 01/30/22      PT LONG TERM GOAL #5   Title Patient will demonstrate improved function as evidenced by a score of 68 on FOTO measure for full participation in activities at home and in the community.    Baseline IE: 50; 1/30: 58; 3/8: 64; 4/26: 69; 6/1: 69    Time 6    Period Weeks    Status Achieved    Target Date 01/30/22      Additional Long Term Goals   Additional Long Term Goals Yes      PT  LONG TERM GOAL #6   Title Patient will be able to participate in driving, dancing, and jumping activities without pain or limitation to demonstrate a return to PLOF and return to full participation at home and in the community.    Baseline 6/1: 7/10 dancing, 10/10 driving    Time 4    Period Weeks    Status New    Target Date 04/11/22               Plan     Clinical Impression Statement Patient presents to clinic with excellent motivation to participate in therapy. Patient continues to demonstrate deficits in RLE strength, gait, pain, and function although improving. Patient able to perform more intermediate Pilates based exercises for postural control during today's session but had noticeable swelling in RLE by end  of session as well as 6/10 pain. Patient will benefit from continued skilled therapeutic intervention to address remaining deficits in RLE strength, gait, pain, and function in order to increase function and improve overall QOL.    Personal Factors and Comorbidities Comorbidity 3+;Time since onset of injury/illness/exacerbation    Comorbidities anemia, asthma, depression, GERD, migraines, PTSD    Examination-Activity Limitations Bathing;Hygiene/Grooming;Toileting;Squat;Sleep;Transfers;Dressing;Stairs;Bend;Lift    Examination-Participation Restrictions Community Activity;Occupation;Driving;Meal Prep;Cleaning;Shop;Laundry    Stability/Clinical Decision Making Evolving/Moderate complexity    Rehab Potential Good    PT Frequency 1x / week    PT Duration 6 weeks    PT Treatment/Interventions ADLs/Self Care Home Management;Cryotherapy;Electrical Stimulation;Moist Heat;Neuromuscular re-education;Therapeutic exercise;Stair training;Gait training;Patient/family education;Orthotic Fit/Training;Manual techniques;Scar mobilization;Taping;Joint Manipulations;Spinal Manipulations    PT Home Exercise Plan [email or web address]; [id number]    Consulted and Agree with Plan of Care Patient        Sheria Lang PT, DPT [phone]  03/28/2022, 5:44 PM

## 2022-04-03 ENCOUNTER — Encounter: Payer: Self-pay | Admitting: Physical Therapy

## 2022-04-09 ENCOUNTER — Encounter: Payer: Self-pay | Admitting: Licensed Practical Nurse

## 2022-04-09 ENCOUNTER — Ambulatory Visit (INDEPENDENT_AMBULATORY_CARE_PROVIDER_SITE_OTHER): Payer: Medicaid Other | Admitting: Licensed Practical Nurse

## 2022-04-09 ENCOUNTER — Other Ambulatory Visit: Payer: Self-pay | Admitting: Licensed Practical Nurse

## 2022-04-09 VITALS — BP 122/74 | Ht 60.0 in | Wt 153.0 lb

## 2022-04-09 DIAGNOSIS — R1903 Right lower quadrant abdominal swelling, mass and lump: Secondary | ICD-10-CM

## 2022-04-09 DIAGNOSIS — R609 Edema, unspecified: Secondary | ICD-10-CM

## 2022-04-11 ENCOUNTER — Encounter: Payer: Self-pay | Admitting: Physical Therapy

## 2022-04-11 ENCOUNTER — Ambulatory Visit: Payer: Medicaid Other | Admitting: Physical Therapy

## 2022-04-11 DIAGNOSIS — R262 Difficulty in walking, not elsewhere classified: Secondary | ICD-10-CM | POA: Diagnosis not present

## 2022-04-11 DIAGNOSIS — M6281 Muscle weakness (generalized): Secondary | ICD-10-CM | POA: Diagnosis not present

## 2022-04-11 NOTE — Therapy (Signed)
OUTPATIENT PHYSICAL THERAPY TREATMENT NOTE   Patient Name: Nicole Hartman MRN: [id number] DOB:[birth year], 26 y.o., female Today's Date: 04/11/2022  PCP: Center, [facility] REFERRING PROVIDER: Center, [place]*   [place] End of Session - 04/11/22 1647     Visit Number 26    Number of Visits 31    Date for PT Re-Evaluation 04/25/22    PT Start Time 1645    PT Stop Time 1725    PT Time Calculation (min) 40 min    Activity Tolerance Patient tolerated treatment well    Behavior During Therapy [facility] for tasks assessed/performed             Past Medical History:  Diagnosis Date   Anemia    Asthma    exercise induced   Cesarean delivery delivered 03/02/2016   Depression 2014   GERD (gastroesophageal reflux disease)    Headache    h/o migraines   PTSD (post-traumatic stress disorder) 2014   Thrombocytopenia (HCC)    Past Surgical History:  Procedure Laterality Date   CESAREAN SECTION N/A 03/01/2016   Procedure: CESAREAN SECTION;  Surgeon: Conard Novak, MD;  Location: [facility] ORS;  Service: Obstetrics;  Laterality: N/A;   CESAREAN SECTION N/A 02/11/2020   Procedure: CESAREAN SECTION;  Surgeon: Conard Novak, MD;  Location: [facility] ORS;  Service: Obstetrics;  Laterality: N/A;  RNFA   CESAREAN SECTION WITH BILATERAL TUBAL LIGATION N/A 09/14/2021   Procedure: CESAREAN SECTION WITH BILATERAL TUBAL LIGATION;  Surgeon: Conard Novak, MD;  Location: [facility] ORS;  Service: Obstetrics;  Laterality: N/A;   COLONOSCOPY WITH PROPOFOL N/A 02/14/2022   Procedure: COLONOSCOPY WITH PROPOFOL;  Surgeon: Toney Reil, MD;  Location: [facility] ENDOSCOPY;  Service: Gastroenterology;  Laterality: N/A;   ESOPHAGOGASTRODUODENOSCOPY (EGD) WITH PROPOFOL N/A 02/14/2022   Procedure: ESOPHAGOGASTRODUODENOSCOPY (EGD) WITH PROPOFOL;  Surgeon: Toney Reil, MD;  Location: [facility] ENDOSCOPY;  Service: Gastroenterology;  Laterality: N/A;   Patient Active Problem List   Diagnosis  Date Noted   Numbness 02/26/2022   Rectal bleeding    Polyp of sigmoid colon    Pernicious anemia 11/13/2021   History of cesarean delivery 09/14/2021   Back pain affecting pregnancy in third trimester 08/27/2021   B12 deficiency 07/13/2021   Thrombocytopenia affecting pregnancy, antepartum (HCC) 07/10/2021   Depressed mood 06/14/2021   History of cesarean delivery affecting pregnancy 06/14/2021   Prenatal care, subsequent pregnancy, second trimester 02/23/2021   Supervision of high risk pregnancy, antepartum 07/02/2019   PTSD (post-traumatic stress disorder) 01/08/2013   MDD (major depressive disorder), recurrent episode, severe (HCC) 01/08/2013    REFERRING DIAG: T14.8XXA (ICD-10-CM) - Nerve injury R26.2 (ICD-10-CM) - Difficulty walking  THERAPY DIAG:  Muscle weakness (generalized)  Difficulty in walking, not elsewhere classified  PERTINENT HISTORY: 09/14/2021 c-section with resulting nerve injury and difficulty walking  PRECAUTIONS: None  SUBJECTIVE: Patient continues to have increased swelling in RLE with increased activity. Patient notes swelling is present but not too bad today. Is waiting to hear from general surgery.  PAIN:  Are you having pain? Yes: NPRS scale: 2/10 TREATMENT Pre-treatment assessment:  RLE: inguinal crease 66.5 cm, midthigh 60 cm, inferior thigh 43.5 cm LLE: inguinal crease 65 cm, midthigh 58 cm, inferior thigh 44 cm  Neuromuscular Re-education: Pilates based session including (Total Gym/reformer exercises): Footwork, parallel, 1st, and 2nd Sidelying footwork, B Knee stretch Scooter press, reverse, B Thigh stretch Ladder drill for improved coordination and postural control in WB    Patient educated throughout  session on appropriate technique and form using multi-modal cueing, HEP, and activity modification. Patient articulated understanding and returned demonstration.  Patient Response to interventions: 3/10 RLE: inguinal crease 65 cm,  midthigh 59 cm, inferior thigh 42 cm LLE: inguinal crease 66 cm, midthigh 58 cm, inferior thigh 42.5 cm    PT Long Term Goals - 03/14/22 1639       PT LONG TERM GOAL #1   Title Patient will be independent with HEP in order to improve strength and balance in order to decrease fall risk and improve function at home and in the community.    Baseline IE: not initiated; 2/7: IND    Time 6    Period Weeks    Status Achieved    Target Date 11/15/21      PT LONG TERM GOAL #2   Title Patient will decrease 5TSTS to < 12 seconds without UE support nor compensatory patterns in order to demonstrate clinically significant improvement in LE strength and to approach peer normative values.    Baseline IE: 63.4 sec (LLE bias); 2/7: intermittent UE support no bias 45.5 sec, LLE bias 20.5 sec; 3/8: 14.54 sec, UE support, no bias; 4/26: 10.96 sec, no UE support, no bias; 6/1: 9.82 sec, no UE support, no bias    Time 6    Period Weeks    Status Achieved    Target Date 01/30/22      PT LONG TERM GOAL #3   Title Patient will decrease TUG to below 14 seconds/decrease in order to demonstrate decreased fall risk.    Baseline IE: assess at next visit    Time 6    Period Weeks    Status Deferred      PT LONG TERM GOAL #4   Title Patient will increase by at least 62m (161ft) in order to demonstrate clinically significant improvement in cardiopulmonary endurance and community ambulation    Baseline IE: assess at next visit; 12/27: 2 min walk test: 56 ft, 5/10 pain (burning), RW, SBA, 2/7: deferred; 2/16: 538 ft, no AD, IND, 2/10 pain; 3/8: 784 ft, no AD, IND, 5/10 pain; 4/26: 1140 ft, no AD, IND, 3/10; 6/1: 1224 ft, no AD, IND, 5/10 pain    Time 6    Period Weeks    Status Achieved    Target Date 01/30/22      PT LONG TERM GOAL #5   Title Patient will demonstrate improved function as evidenced by a score of 68 on FOTO measure for full participation in activities at home and in the community.     Baseline IE: 50; 1/30: 58; 3/8: 64; 4/26: 69; 6/1: 69    Time 6    Period Weeks    Status Achieved    Target Date 01/30/22      Additional Long Term Goals   Additional Long Term Goals Yes      PT LONG TERM GOAL #6   Title Patient will be able to participate in driving, dancing, and jumping activities without pain or limitation to demonstrate a return to PLOF and return to full participation at home and in the community.    Baseline 6/1: 7/10 dancing, 10/10 driving    Time 4    Period Weeks    Status New    Target Date 04/11/22               Plan     Clinical Impression Statement Patient presents to clinic with excellent motivation to participate  in therapy. Patient continues to demonstrate deficits in RLE strength, gait, pain, and function although improving. Patient had reduction in RLE swelling with more hip extension focused intervention sequence during today's session and had no significant increase in pain, but does have tenderness at distal adductor complex/pes anserinus region. Patient will benefit from continued skilled therapeutic intervention to address remaining deficits in RLE strength, gait, pain, and function in order to increase function and improve overall QOL.    Personal Factors and Comorbidities Comorbidity 3+;Time since onset of injury/illness/exacerbation    Comorbidities anemia, asthma, depression, GERD, migraines, PTSD    Examination-Activity Limitations Bathing;Hygiene/Grooming;Toileting;Squat;Sleep;Transfers;Dressing;Stairs;Bend;Lift    Examination-Participation Restrictions Community Activity;Occupation;Driving;Meal Prep;Cleaning;Shop;Laundry    Stability/Clinical Decision Making Evolving/Moderate complexity    Rehab Potential Good    PT Frequency 1x / week    PT Duration 6 weeks    PT Treatment/Interventions ADLs/Self Care Home Management;Cryotherapy;Electrical Stimulation;Moist Heat;Neuromuscular re-education;Therapeutic exercise;Stair training;Gait  training;Patient/family education;Orthotic Fit/Training;Manual techniques;Scar mobilization;Taping;Joint Manipulations;Spinal Manipulations    PT Home Exercise Plan [email or web address]; [id number]    Consulted and Agree with Plan of Care Patient        Sheria Lang PT, DPT [phone]  04/11/2022, 4:47 PM

## 2022-04-17 ENCOUNTER — Ambulatory Visit: Payer: Medicaid Other | Attending: Obstetrics and Gynecology | Admitting: Physical Therapy

## 2022-04-17 DIAGNOSIS — R262 Difficulty in walking, not elsewhere classified: Secondary | ICD-10-CM | POA: Insufficient documentation

## 2022-04-17 DIAGNOSIS — M6281 Muscle weakness (generalized): Secondary | ICD-10-CM | POA: Insufficient documentation

## 2022-04-18 ENCOUNTER — Encounter: Payer: Self-pay | Admitting: Physical Therapy

## 2022-04-18 ENCOUNTER — Ambulatory Visit: Payer: Medicaid Other | Admitting: Physical Therapy

## 2022-04-18 DIAGNOSIS — M6281 Muscle weakness (generalized): Secondary | ICD-10-CM

## 2022-04-18 DIAGNOSIS — R262 Difficulty in walking, not elsewhere classified: Secondary | ICD-10-CM

## 2022-04-18 NOTE — Therapy (Signed)
OUTPATIENT PHYSICAL THERAPY TREATMENT NOTE   Patient Name: Nicole Hartman MRN: [id number] DOB:1996/05/30, 26 y.o., female Today's Date: 04/18/2022  PCP: Center, [facility] REFERRING PROVIDER: Natale Milch, *   PT End of Session - 04/18/22 1701     Visit Number 27    Number of Visits 31    Date for PT Re-Evaluation 04/25/22    PT Start Time 1600    PT Stop Time 1640    PT Time Calculation (min) 40 min    Activity Tolerance Patient tolerated treatment well    Behavior During Therapy [facility] for tasks assessed/performed             Past Medical History:  Diagnosis Date   Anemia    Asthma    exercise induced   Cesarean delivery delivered 03/02/2016   Depression 2014   GERD (gastroesophageal reflux disease)    Headache    h/o migraines   PTSD (post-traumatic stress disorder) 2014   Thrombocytopenia (HCC)    Past Surgical History:  Procedure Laterality Date   CESAREAN SECTION N/A 03/01/2016   Procedure: CESAREAN SECTION;  Surgeon: Conard Novak, MD;  Location: [facility] ORS;  Service: Obstetrics;  Laterality: N/A;   CESAREAN SECTION N/A 02/11/2020   Procedure: CESAREAN SECTION;  Surgeon: Conard Novak, MD;  Location: [facility] ORS;  Service: Obstetrics;  Laterality: N/A;  RNFA   CESAREAN SECTION WITH BILATERAL TUBAL LIGATION N/A 09/14/2021   Procedure: CESAREAN SECTION WITH BILATERAL TUBAL LIGATION;  Surgeon: Conard Novak, MD;  Location: [facility] ORS;  Service: Obstetrics;  Laterality: N/A;   COLONOSCOPY WITH PROPOFOL N/A 02/14/2022   Procedure: COLONOSCOPY WITH PROPOFOL;  Surgeon: Toney Reil, MD;  Location: [facility] ENDOSCOPY;  Service: Gastroenterology;  Laterality: N/A;   ESOPHAGOGASTRODUODENOSCOPY (EGD) WITH PROPOFOL N/A 02/14/2022   Procedure: ESOPHAGOGASTRODUODENOSCOPY (EGD) WITH PROPOFOL;  Surgeon: Toney Reil, MD;  Location: [facility] ENDOSCOPY;  Service: Gastroenterology;  Laterality: N/A;   Patient Active Problem List   Diagnosis  Date Noted   Numbness 02/26/2022   Rectal bleeding    Polyp of sigmoid colon    Pernicious anemia 11/13/2021   History of cesarean delivery 09/14/2021   Back pain affecting pregnancy in third trimester 08/27/2021   B12 deficiency 07/13/2021   Thrombocytopenia affecting pregnancy, antepartum (HCC) 07/10/2021   Depressed mood 06/14/2021   History of cesarean delivery affecting pregnancy 06/14/2021   Prenatal care, subsequent pregnancy, second trimester 02/23/2021   Supervision of high risk pregnancy, antepartum 07/02/2019   PTSD (post-traumatic stress disorder) 01/08/2013   MDD (major depressive disorder), recurrent episode, severe (HCC) 01/08/2013    REFERRING DIAG: T14.8XXA (ICD-10-CM) - Nerve injury R26.2 (ICD-10-CM) - Difficulty walking  THERAPY DIAG:  Muscle weakness (generalized)  Difficulty in walking, not elsewhere classified  PERTINENT HISTORY: 09/14/2021 c-section with resulting nerve injury and difficulty walking  PRECAUTIONS: None  SUBJECTIVE: Patient able to drive to clinic today with pain controlled. Patient notes swelling continues to be intermittent and is not always a direct result of activity. Patient continues to incorporate more general fitness activities in to her routine but still feels limited with fast movements and movements with impact. PAIN:  Are you having pain? Yes: NPRS scale: 2/10 TREATMENT  Neuromuscular Re-education: Pilates based session including (Total Gym/reformer exercises): Footwork, parallel, 1st, and 2nd Sidelying footwork, B Knee stretch Scooter press, reverse, B Eve's Lunge, B Thigh stretch Chest Expansion Elephant Prone spine extension with tricep press down Prone spine extension with scapular row  Patient educated throughout session on appropriate technique and form using multi-modal cueing, HEP, and activity modification. Patient articulated understanding and returned demonstration.  Patient Response to  interventions: Does not note increased pain; slight burning in anterior R thigh     PT Long Term Goals - 03/14/22 1639       PT LONG TERM GOAL #1   Title Patient will be independent with HEP in order to improve strength and balance in order to decrease fall risk and improve function at home and in the community.    Baseline IE: not initiated; 2/7: IND    Time 6    Period Weeks    Status Achieved    Target Date 11/15/21      PT LONG TERM GOAL #2   Title Patient will decrease 5TSTS to < 12 seconds without UE support nor compensatory patterns in order to demonstrate clinically significant improvement in LE strength and to approach peer normative values.    Baseline IE: 63.4 sec (LLE bias); 2/7: intermittent UE support no bias 45.5 sec, LLE bias 20.5 sec; 3/8: 14.54 sec, UE support, no bias; 4/26: 10.96 sec, no UE support, no bias; 6/1: 9.82 sec, no UE support, no bias    Time 6    Period Weeks    Status Achieved    Target Date 01/30/22      PT LONG TERM GOAL #3   Title Patient will decrease TUG to below 14 seconds/decrease in order to demonstrate decreased fall risk.    Baseline IE: assess at next visit    Time 6    Period Weeks    Status Deferred      PT LONG TERM GOAL #4   Title Patient will increase 6MWT by at least 29m (172ft) in order to demonstrate clinically significant improvement in cardiopulmonary endurance and community ambulation    Baseline IE: assess at next visit; 12/27: 2 min walk test: 56 ft, 5/10 pain (burning), RW, SBA, 2/7: deferred; 2/16: 538 ft, no AD, IND, 2/10 pain; 3/8: 784 ft, no AD, IND, 5/10 pain; 4/26: 1140 ft, no AD, IND, 3/10; 6/1: 1224 ft, no AD, IND, 5/10 pain    Time 6    Period Weeks    Status Achieved    Target Date 01/30/22      PT LONG TERM GOAL #5   Title Patient will demonstrate improved function as evidenced by a score of 68 on FOTO measure for full participation in activities at home and in the community.    Baseline IE: 50; 1/30: 58;  3/8: 64; 4/26: 69; 6/1: 69    Time 6    Period Weeks    Status Achieved    Target Date 01/30/22      Additional Long Term Goals   Additional Long Term Goals Yes      PT LONG TERM GOAL #6   Title Patient will be able to participate in driving, dancing, and jumping activities without pain or limitation to demonstrate a return to PLOF and return to full participation at home and in the community.    Baseline 6/1: 7/10 dancing, 10/10 driving    Time 4    Period Weeks    Status New    Target Date 04/11/22               Plan     Clinical Impression Statement Patient presents to clinic with excellent motivation to participate in therapy. Patient continues to demonstrate deficits in RLE strength,  gait, pain, and function although improving. Patient had excellent tolerance to Pilates based interventions during today's session and was able to perform all without significant increase in pain. Patient will benefit from continued skilled therapeutic intervention to address remaining deficits in RLE strength, gait, pain, and function in order to increase function and improve overall QOL.    Personal Factors and Comorbidities Comorbidity 3+;Time since onset of injury/illness/exacerbation    Comorbidities anemia, asthma, depression, GERD, migraines, PTSD    Examination-Activity Limitations Bathing;Hygiene/Grooming;Toileting;Squat;Sleep;Transfers;Dressing;Stairs;Bend;Lift    Examination-Participation Restrictions Community Activity;Occupation;Driving;Meal Prep;Cleaning;Shop;Laundry    Stability/Clinical Decision Making Evolving/Moderate complexity    Rehab Potential Good    PT Frequency 1x / week    PT Duration 6 weeks    PT Treatment/Interventions ADLs/Self Care Home Management;Cryotherapy;Electrical Stimulation;Moist Heat;Neuromuscular re-education;Therapeutic exercise;Stair training;Gait training;Patient/family education;Orthotic Fit/Training;Manual techniques;Scar mobilization;Taping;Joint  Manipulations;Spinal Manipulations    PT Home Exercise Plan [email or web address]; [id number]    Consulted and Agree with Plan of Care Patient        Sheria Lang PT, DPT [phone]  04/18/2022, 5:03 PM

## 2022-04-24 ENCOUNTER — Encounter: Payer: Self-pay | Admitting: Physical Therapy

## 2022-04-24 ENCOUNTER — Ambulatory Visit: Payer: Medicaid Other | Admitting: Physical Therapy

## 2022-04-24 DIAGNOSIS — R262 Difficulty in walking, not elsewhere classified: Secondary | ICD-10-CM | POA: Diagnosis not present

## 2022-04-24 DIAGNOSIS — Z1331 Encounter for screening for depression: Secondary | ICD-10-CM | POA: Diagnosis not present

## 2022-04-24 DIAGNOSIS — H7291 Unspecified perforation of tympanic membrane, right ear: Secondary | ICD-10-CM | POA: Diagnosis not present

## 2022-04-24 DIAGNOSIS — Z1389 Encounter for screening for other disorder: Secondary | ICD-10-CM | POA: Diagnosis not present

## 2022-04-24 DIAGNOSIS — M6281 Muscle weakness (generalized): Secondary | ICD-10-CM

## 2022-04-24 NOTE — Therapy (Signed)
OUTPATIENT PHYSICAL THERAPY TREATMENT NOTE   Patient Name: Nicole Hartman MRN: [id number] DOB:01-Oct-1996, 26 y.o., female Today's Date: 04/24/2022  PCP: Center, [facility] REFERRING PROVIDER: Natale Milch, *   PT End of Session - 04/24/22 1744     Visit Number 28    Number of Visits 31    Date for PT Re-Evaluation 04/25/22    PT Start Time 1700    PT Stop Time 1740    PT Time Calculation (min) 40 min    Activity Tolerance Patient tolerated treatment well    Behavior During Therapy [facility] for tasks assessed/performed             Past Medical History:  Diagnosis Date   Anemia    Asthma    exercise induced   Cesarean delivery delivered 03/02/2016   Depression 2014   GERD (gastroesophageal reflux disease)    Headache    h/o migraines   PTSD (post-traumatic stress disorder) 2014   Thrombocytopenia (HCC)    Past Surgical History:  Procedure Laterality Date   CESAREAN SECTION N/A 03/01/2016   Procedure: CESAREAN SECTION;  Surgeon: Conard Novak, MD;  Location: [facility] ORS;  Service: Obstetrics;  Laterality: N/A;   CESAREAN SECTION N/A 02/11/2020   Procedure: CESAREAN SECTION;  Surgeon: Conard Novak, MD;  Location: [facility] ORS;  Service: Obstetrics;  Laterality: N/A;  RNFA   CESAREAN SECTION WITH BILATERAL TUBAL LIGATION N/A 09/14/2021   Procedure: CESAREAN SECTION WITH BILATERAL TUBAL LIGATION;  Surgeon: Conard Novak, MD;  Location: [facility] ORS;  Service: Obstetrics;  Laterality: N/A;   COLONOSCOPY WITH PROPOFOL N/A 02/14/2022   Procedure: COLONOSCOPY WITH PROPOFOL;  Surgeon: Toney Reil, MD;  Location: [facility] ENDOSCOPY;  Service: Gastroenterology;  Laterality: N/A;   ESOPHAGOGASTRODUODENOSCOPY (EGD) WITH PROPOFOL N/A 02/14/2022   Procedure: ESOPHAGOGASTRODUODENOSCOPY (EGD) WITH PROPOFOL;  Surgeon: Toney Reil, MD;  Location: [facility] ENDOSCOPY;  Service: Gastroenterology;  Laterality: N/A;   Patient Active Problem List   Diagnosis  Date Noted   Numbness 02/26/2022   Rectal bleeding    Polyp of sigmoid colon    Pernicious anemia 11/13/2021   History of cesarean delivery 09/14/2021   Back pain affecting pregnancy in third trimester 08/27/2021   B12 deficiency 07/13/2021   Thrombocytopenia affecting pregnancy, antepartum (HCC) 07/10/2021   Depressed mood 06/14/2021   History of cesarean delivery affecting pregnancy 06/14/2021   Prenatal care, subsequent pregnancy, second trimester 02/23/2021   Supervision of high risk pregnancy, antepartum 07/02/2019   PTSD (post-traumatic stress disorder) 01/08/2013   MDD (major depressive disorder), recurrent episode, severe (HCC) 01/08/2013    REFERRING DIAG: T14.8XXA (ICD-10-CM) - Nerve injury R26.2 (ICD-10-CM) - Difficulty walking  THERAPY DIAG:  Muscle weakness (generalized)  Difficulty in walking, not elsewhere classified  PERTINENT HISTORY: 09/14/2021 c-section with resulting nerve injury and difficulty walking  PRECAUTIONS: None  SUBJECTIVE: Patient able to drive to clinic today with pain controlled. Patient has had increased activity at work this week and has noticed some increased fatigue as a result. States that her R thigh is burning on arrival, but amenable to participate. PAIN:  Are you having pain? Yes: NPRS scale: 2/10 TREATMENT  Neuromuscular Re-education: Treadmill walking < 2.0 mph for endogenous pain relief and improved coordination of hip extension in functional posture Pilates based session including (Total Gym/reformer exercises): Footwork, parallel, 1st, and 2nd Sidelying footwork, B Knee stretch Scooter press, reverse, B Eve's Lunge, B Thigh stretch Mermaid, B Reassessed FOTO and DRAM; DRAM ~ 2.5  finger width at umbilicus, < 2 finger superior and inferior    Patient educated throughout session on appropriate technique and form using multi-modal cueing, HEP, and activity modification. Patient articulated understanding and returned  demonstration.  Patient Response to interventions: Comfortable to return in 2 weeks     PT Long Term Goals - 03/14/22 1639       PT LONG TERM GOAL #1   Title Patient will be independent with HEP in order to improve strength and balance in order to decrease fall risk and improve function at home and in the community.    Baseline IE: not initiated; 2/7: IND    Time 6    Period Weeks    Status Achieved    Target Date 11/15/21      PT LONG TERM GOAL #2   Title Patient will decrease 5TSTS to < 12 seconds without UE support nor compensatory patterns in order to demonstrate clinically significant improvement in LE strength and to approach peer normative values.    Baseline IE: 63.4 sec (LLE bias); 2/7: intermittent UE support no bias 45.5 sec, LLE bias 20.5 sec; 3/8: 14.54 sec, UE support, no bias; 4/26: 10.96 sec, no UE support, no bias; 6/1: 9.82 sec, no UE support, no bias    Time 6    Period Weeks    Status Achieved    Target Date 01/30/22      PT LONG TERM GOAL #3   Title Patient will decrease TUG to below 14 seconds/decrease in order to demonstrate decreased fall risk.    Baseline IE: assess at next visit    Time 6    Period Weeks    Status Deferred      PT LONG TERM GOAL #4   Title Patient will increase by at least 66m (149ft) in order to demonstrate clinically significant improvement in cardiopulmonary endurance and community ambulation    Baseline IE: assess at next visit; 12/27: 2 min walk test: 56 ft, 5/10 pain (burning), RW, SBA, 2/7: deferred; 2/16: 538 ft, no AD, IND, 2/10 pain; 3/8: 784 ft, no AD, IND, 5/10 pain; 4/26: 1140 ft, no AD, IND, 3/10; 6/1: 1224 ft, no AD, IND, 5/10 pain    Time 6    Period Weeks    Status Achieved    Target Date 01/30/22      PT LONG TERM GOAL #5   Title Patient will demonstrate improved function as evidenced by a score of 68 on FOTO measure for full participation in activities at home and in the community.    Baseline IE: 50; 1/30:  58; 3/8: 64; 4/26: 69; 6/1: 69    Time 6    Period Weeks    Status Achieved    Target Date 01/30/22      Additional Long Term Goals   Additional Long Term Goals Yes      PT LONG TERM GOAL #6   Title Patient will be able to participate in driving, dancing, and jumping activities without pain or limitation to demonstrate a return to PLOF and return to full participation at home and in the community.    Baseline 6/1: 7/10 dancing, 10/10 driving    Time 4    Period Weeks    Status New    Target Date 04/11/22               Plan     Clinical Impression Statement Patient presents to clinic with excellent motivation to participate in therapy.  Patient continues to demonstrate deficits in RLE strength, gait, pain, and function although improving. Patient indicating continued improvement in function as measured with FOTO (73) during today's session and DRAM continues to approximate with greatest separation at umbilicus (~2.5 finger width). Patient will benefit from continued skilled therapeutic intervention to address remaining deficits in RLE strength, gait, pain, and function in order to increase function and improve overall QOL.    Personal Factors and Comorbidities Comorbidity 3+;Time since onset of injury/illness/exacerbation    Comorbidities anemia, asthma, depression, GERD, migraines, PTSD    Examination-Activity Limitations Bathing;Hygiene/Grooming;Toileting;Squat;Sleep;Transfers;Dressing;Stairs;Bend;Lift    Examination-Participation Restrictions Community Activity;Occupation;Driving;Meal Prep;Cleaning;Shop;Laundry    Stability/Clinical Decision Making Evolving/Moderate complexity    Rehab Potential Good    PT Frequency 1x / week    PT Duration 6 weeks    PT Treatment/Interventions ADLs/Self Care Home Management;Cryotherapy;Electrical Stimulation;Moist Heat;Neuromuscular re-education;Therapeutic exercise;Stair training;Gait training;Patient/family education;Orthotic  Fit/Training;Manual techniques;Scar mobilization;Taping;Joint Manipulations;Spinal Manipulations    PT Home Exercise Plan [email or web address]; [id number]    Consulted and Agree with Plan of Care Patient        Sheria Lang PT, DPT [phone]  04/24/2022, 5:45 PM

## 2022-05-09 ENCOUNTER — Ambulatory Visit: Payer: Medicaid Other | Admitting: Physical Therapy

## 2022-05-09 DIAGNOSIS — R262 Difficulty in walking, not elsewhere classified: Secondary | ICD-10-CM | POA: Diagnosis not present

## 2022-05-09 DIAGNOSIS — M6281 Muscle weakness (generalized): Secondary | ICD-10-CM

## 2022-05-09 NOTE — Therapy (Signed)
OUTPATIENT PHYSICAL THERAPY TREATMENT NOTE   Patient Name: Nicole Hartman MRN: [id number] DOB:Jan 22, 1996, 26 y.o., female Today's Date: 05/09/2022  PCP: [facility], [facility] REFERRING PROVIDER: Natale Milch, *   PT End of Session - 05/09/22 1601     Visit Number 29    Number of Visits 31    Date for PT Re-Evaluation 06/06/22    PT Start Time 1600    PT Stop Time 1640    PT Time Calculation (min) 40 min    Activity Tolerance Patient tolerated treatment well    Behavior During Therapy [facility] for tasks assessed/performed             Past Medical History:  Diagnosis Date   Anemia    Asthma    exercise induced   Cesarean delivery delivered 03/02/2016   Depression 2014   GERD (gastroesophageal reflux disease)    Headache    h/o migraines   PTSD (post-traumatic stress disorder) 2014   Thrombocytopenia (HCC)    Past Surgical History:  Procedure Laterality Date   CESAREAN SECTION N/A 03/01/2016   Procedure: CESAREAN SECTION;  Surgeon: Conard Novak, MD;  Location: [facility] ORS;  Service: Obstetrics;  Laterality: N/A;   CESAREAN SECTION N/A 02/11/2020   Procedure: CESAREAN SECTION;  Surgeon: Conard Novak, MD;  Location: [facility] ORS;  Service: Obstetrics;  Laterality: N/A;  RNFA   CESAREAN SECTION WITH BILATERAL TUBAL LIGATION N/A 09/14/2021   Procedure: CESAREAN SECTION WITH BILATERAL TUBAL LIGATION;  Surgeon: Conard Novak, MD;  Location: [facility] ORS;  Service: Obstetrics;  Laterality: N/A;   COLONOSCOPY WITH PROPOFOL N/A 02/14/2022   Procedure: COLONOSCOPY WITH PROPOFOL;  Surgeon: Toney Reil, MD;  Location: [facility] ENDOSCOPY;  Service: Gastroenterology;  Laterality: N/A;   ESOPHAGOGASTRODUODENOSCOPY (EGD) WITH PROPOFOL N/A 02/14/2022   Procedure: ESOPHAGOGASTRODUODENOSCOPY (EGD) WITH PROPOFOL;  Surgeon: Toney Reil, MD;  Location: [facility] ENDOSCOPY;  Service: Gastroenterology;  Laterality: N/A;   Patient Active Problem List   Diagnosis  Date Noted   Numbness 02/26/2022   Rectal bleeding    Polyp of sigmoid colon    Pernicious anemia 11/13/2021   History of cesarean delivery 09/14/2021   Back pain affecting pregnancy in third trimester 08/27/2021   B12 deficiency 07/13/2021   Thrombocytopenia affecting pregnancy, antepartum (HCC) 07/10/2021   Depressed mood 06/14/2021   History of cesarean delivery affecting pregnancy 06/14/2021   Prenatal care, subsequent pregnancy, second trimester 02/23/2021   Supervision of high risk pregnancy, antepartum 07/02/2019   PTSD (post-traumatic stress disorder) 01/08/2013   MDD (major depressive disorder), recurrent episode, severe (HCC) 01/08/2013    REFERRING DIAG: T14.8XXA (ICD-10-CM) - Nerve injury R26.2 (ICD-10-CM) - Difficulty walking  THERAPY DIAG:  Muscle weakness (generalized)  Difficulty in walking, not elsewhere classified  PERTINENT HISTORY: 09/14/2021 c-section with resulting nerve injury and difficulty walking  PRECAUTIONS: None  SUBJECTIVE: Patient reports that she continues to increase physical activity but has lingering difficulties with impact exercises and footwork/coordination. Patient has some increased R hamstring tension today. PAIN:  Are you having pain? Yes: NPRS scale: 2/10 TREATMENT  Neuromuscular Re-education: Reassessed goals; see below.  Postural interventions as follows: Modified dead bug progression Modified dead bug isometric Rib flare corrective with serratus punch, RTB Thoracic extension over foam roller, multiple levels.  Patient education on gradual progression/return to activities to decrease pain/soreness post-activity.    Patient educated throughout session on appropriate technique and form using multi-modal cueing, HEP, and activity modification. Patient articulated understanding and returned demonstration.  Patient Response to interventions: Does not report increased pain     PT Long Term Goals - 05/09/22 1652       PT LONG  TERM GOAL #1   Title Patient will be independent with HEP in order to improve strength and balance in order to decrease fall risk and improve function at home and in the community.    Baseline IE: not initiated; 2/7: IND    Time 6    Period Weeks    Status Achieved    Target Date 11/15/21      PT LONG TERM GOAL #2   Title Patient will decrease 5TSTS to < 12 seconds without UE support nor compensatory patterns in order to demonstrate clinically significant improvement in LE strength and to approach peer normative values.    Baseline IE: 63.4 sec (LLE bias); 2/7: intermittent UE support no bias 45.5 sec, LLE bias 20.5 sec; 3/8: 14.54 sec, UE support, no bias; 4/26: 10.96 sec, no UE support, no bias; 6/1: 9.82 sec, no UE support, no bias    Time 6    Period Weeks    Status Achieved    Target Date 01/30/22      PT LONG TERM GOAL #3   Title Patient will decrease TUG to below 14 seconds/decrease in order to demonstrate decreased fall risk.    Baseline IE: assess at next visit    Time 6    Period Weeks    Status Deferred      PT LONG TERM GOAL #4   Title Patient will increase by at least 30m (161ft) in order to demonstrate clinically significant improvement in cardiopulmonary endurance and community ambulation    Baseline IE: assess at next visit; 12/27: 2 min walk test: 56 ft, 5/10 pain (burning), RW, SBA, 2/7: deferred; 2/16: 538 ft, no AD, IND, 2/10 pain; 3/8: 784 ft, no AD, IND, 5/10 pain; 4/26: 1140 ft, no AD, IND, 3/10; 6/1: 1224 ft, no AD, IND, 5/10 pain    Time 6    Period Weeks    Status Achieved    Target Date 01/30/22      PT LONG TERM GOAL #5   Title Patient will demonstrate improved function as evidenced by a score of 68 on FOTO measure for full participation in activities at home and in the community.    Baseline IE: 50; 1/30: 58; 3/8: 64; 4/26: 69; 6/1: 69; 7/12: 73    Time 6    Period Weeks    Status Achieved    Target Date 01/30/22      PT LONG TERM GOAL #6    Title Patient will be able to participate in driving, dancing, and jumping activities without pain or limitation to demonstrate a return to PLOF and return to full participation at home and in the community.    Baseline 6/1: 7/10 dancing, 10/10 driving; 9/41: able to participate in driving, dancing and jumping still limited with increased pain post-activity for > 24 hours    Time 4    Period Weeks    Status On-going    Target Date 06/06/22               Plan - 05/09/22 1704     Clinical Impression Statement Patient presents to clinic with excellent motivation to participate in therapy. Patient continues to demonstrate deficits in postural control, gait, pain, and function although improving. Patient with good form on thoracic mobility and rib flare corrective interventions during today's session  which should continue to reduce DRAM. Patient continues to be limited in function and has not returned to PLOF with respect to physical fitness/wellness activities and thus, QoL continues to be negatively impacted. Patient will benefit from continued skilled therapeutic intervention to address remaining deficits in postural control, gait, pain, and function in order to increase function and improve overall QOL.    Personal Factors and Comorbidities Comorbidity 3+;Time since onset of injury/illness/exacerbation    Comorbidities anemia, asthma, depression, GERD, migraines, PTSD    Examination-Activity Limitations Bathing;Hygiene/Grooming;Toileting;Squat;Sleep;Transfers;Dressing;Stairs;Bend;Lift    Examination-Participation Restrictions Community Activity;Occupation;Driving;Meal Prep;Cleaning;Shop;Laundry    Stability/Clinical Decision Making Evolving/Moderate complexity    Rehab Potential Good    PT Frequency 1x / week    PT Duration 4 weeks    PT Treatment/Interventions ADLs/Self Care Home Management;Cryotherapy;Electrical Stimulation;Moist Heat;Neuromuscular re-education;Therapeutic exercise;Stair  training;Gait training;Patient/family education;Orthotic Fit/Training;Manual techniques;Scar mobilization;Taping;Joint Manipulations;Spinal Manipulations    PT Home Exercise Plan [id number]; [id number]    Consulted and Agree with Plan of Care Patient               Sheria Lang PT, DPT [phone]  05/09/2022, 5:05 PM

## 2022-05-10 ENCOUNTER — Other Ambulatory Visit: Payer: Self-pay

## 2022-05-10 DIAGNOSIS — D510 Vitamin B12 deficiency anemia due to intrinsic factor deficiency: Secondary | ICD-10-CM

## 2022-05-13 ENCOUNTER — Inpatient Hospital Stay: Payer: Medicaid Other | Attending: Oncology

## 2022-05-13 DIAGNOSIS — D508 Other iron deficiency anemias: Secondary | ICD-10-CM | POA: Insufficient documentation

## 2022-05-13 DIAGNOSIS — D510 Vitamin B12 deficiency anemia due to intrinsic factor deficiency: Secondary | ICD-10-CM

## 2022-05-13 LAB — CBC WITH DIFFERENTIAL/PLATELET
Abs Immature Granulocytes: 0.01 10*3/uL (ref 0.00–0.07)
Basophils Absolute: 0.1 10*3/uL (ref 0.0–0.1)
Basophils Relative: 1 %
Eosinophils Absolute: 0.1 10*3/uL (ref 0.0–0.5)
Eosinophils Relative: 2 %
HCT: 38.9 % (ref 36.0–46.0)
Hemoglobin: 12.8 g/dL (ref 12.0–15.0)
Immature Granulocytes: 0 %
Lymphocytes Relative: 41 %
Lymphs Abs: 2.3 10*3/uL (ref 0.7–4.0)
MCH: 28.8 pg (ref 26.0–34.0)
MCHC: 32.9 g/dL (ref 30.0–36.0)
MCV: 87.6 fL (ref 80.0–100.0)
Monocytes Absolute: 0.4 10*3/uL (ref 0.1–1.0)
Monocytes Relative: 7 %
Neutro Abs: 2.8 10*3/uL (ref 1.7–7.7)
Neutrophils Relative %: 49 %
Platelets: 203 10*3/uL (ref 150–400)
RBC: 4.44 MIL/uL (ref 3.87–5.11)
RDW: 12.1 % (ref 11.5–15.5)
WBC: 5.7 10*3/uL (ref 4.0–10.5)
nRBC: 0 % (ref 0.0–0.2)

## 2022-05-13 LAB — IRON AND TIBC
Iron: 60 ug/dL (ref 28–170)
Saturation Ratios: 13 % (ref 10.4–31.8)
TIBC: 482 ug/dL — ABNORMAL HIGH (ref 250–450)
UIBC: 422 ug/dL

## 2022-05-13 LAB — FERRITIN: Ferritin: 6 ng/mL — ABNORMAL LOW (ref 11–307)

## 2022-05-15 ENCOUNTER — Ambulatory Visit: Payer: Medicaid Other | Attending: Obstetrics and Gynecology | Admitting: Physical Therapy

## 2022-05-15 ENCOUNTER — Encounter: Payer: Self-pay | Admitting: Oncology

## 2022-05-15 ENCOUNTER — Inpatient Hospital Stay: Payer: Medicaid Other

## 2022-05-15 ENCOUNTER — Encounter: Payer: Self-pay | Admitting: Physical Therapy

## 2022-05-15 ENCOUNTER — Inpatient Hospital Stay: Payer: Medicaid Other | Attending: Oncology | Admitting: Oncology

## 2022-05-15 VITALS — BP 111/66 | HR 72 | Resp 20

## 2022-05-15 VITALS — BP 109/66 | HR 84 | Temp 97.9°F | Wt 150.0 lb

## 2022-05-15 DIAGNOSIS — M6281 Muscle weakness (generalized): Secondary | ICD-10-CM | POA: Diagnosis not present

## 2022-05-15 DIAGNOSIS — E538 Deficiency of other specified B group vitamins: Secondary | ICD-10-CM | POA: Diagnosis not present

## 2022-05-15 DIAGNOSIS — Z803 Family history of malignant neoplasm of breast: Secondary | ICD-10-CM | POA: Insufficient documentation

## 2022-05-15 DIAGNOSIS — N920 Excessive and frequent menstruation with regular cycle: Secondary | ICD-10-CM | POA: Insufficient documentation

## 2022-05-15 DIAGNOSIS — R531 Weakness: Secondary | ICD-10-CM | POA: Insufficient documentation

## 2022-05-15 DIAGNOSIS — R262 Difficulty in walking, not elsewhere classified: Secondary | ICD-10-CM

## 2022-05-15 DIAGNOSIS — Z800 Family history of malignant neoplasm of digestive organs: Secondary | ICD-10-CM | POA: Diagnosis not present

## 2022-05-15 DIAGNOSIS — D510 Vitamin B12 deficiency anemia due to intrinsic factor deficiency: Secondary | ICD-10-CM

## 2022-05-15 DIAGNOSIS — Z79899 Other long term (current) drug therapy: Secondary | ICD-10-CM | POA: Insufficient documentation

## 2022-05-15 DIAGNOSIS — D508 Other iron deficiency anemias: Secondary | ICD-10-CM | POA: Diagnosis not present

## 2022-05-15 MED ORDER — SODIUM CHLORIDE 0.9 % IV SOLN
Freq: Once | INTRAVENOUS | Status: AC
  Filled 2022-05-15: qty 250

## 2022-05-15 MED ORDER — SODIUM CHLORIDE 0.9 % IV SOLN
200.0000 mg | Freq: Once | INTRAVENOUS | Status: AC
  Administered 2022-05-15: 200 mg via INTRAVENOUS
  Filled 2022-05-15: qty 200

## 2022-05-15 NOTE — Therapy (Signed)
OUTPATIENT PHYSICAL THERAPY TREATMENT NOTE Physical Therapy Progress Note   Dates of reporting period  02/22/2022   to   05/15/2022    Patient Name: Nicole Hartman MRN: [id number] DOB:19-Nov-1995, 26 y.o., female Today's Date: 05/15/2022  PCP: [facility], [place] PROVIDER: Homero Fellers, *   PT End of Session - 05/15/22 1155     Visit Number 30    Number of Visits 31    Date for PT Re-Evaluation 06/06/22    PT Start Time 0945    PT Stop Time 1025    PT Time Calculation (min) 40 min    Activity Tolerance Patient tolerated treatment well    Behavior During Therapy [facility] for tasks assessed/performed             Past Medical History:  Diagnosis Date   Anemia    Asthma    exercise induced   Cesarean delivery delivered 03/02/2016   Depression 2014   GERD (gastroesophageal reflux disease)    Headache    h/o migraines   PTSD (post-traumatic stress disorder) 2014   Thrombocytopenia ([place])    Past Surgical History:  Procedure Laterality Date   CESAREAN SECTION N/A 03/01/2016   Procedure: CESAREAN SECTION;  Surgeon: Will Bonnet, MD;  Location: [facility] ORS;  Service: Obstetrics;  Laterality: N/A;   CESAREAN SECTION N/A 02/11/2020   Procedure: CESAREAN SECTION;  Surgeon: Will Bonnet, MD;  Location: [facility] ORS;  Service: Obstetrics;  Laterality: N/A;  RNFA   CESAREAN SECTION WITH BILATERAL TUBAL LIGATION N/A 09/14/2021   Procedure: CESAREAN SECTION WITH BILATERAL TUBAL LIGATION;  Surgeon: Will Bonnet, MD;  Location: [facility] ORS;  Service: Obstetrics;  Laterality: N/A;   COLONOSCOPY WITH PROPOFOL N/A 02/14/2022   Procedure: COLONOSCOPY WITH PROPOFOL;  Surgeon: Lin Landsman, MD;  Location: [facility] ENDOSCOPY;  Service: Gastroenterology;  Laterality: N/A;   ESOPHAGOGASTRODUODENOSCOPY (EGD) WITH PROPOFOL N/A 02/14/2022   Procedure: ESOPHAGOGASTRODUODENOSCOPY (EGD) WITH PROPOFOL;  Surgeon: Lin Landsman, MD;  Location: [facility] ENDOSCOPY;   Service: Gastroenterology;  Laterality: N/A;   Patient Active Problem List   Diagnosis Date Noted   Numbness 02/26/2022   Rectal bleeding    Polyp of sigmoid colon    Pernicious anemia 11/13/2021   History of cesarean delivery 09/14/2021   Back pain affecting pregnancy in third trimester 08/27/2021   B12 deficiency 07/13/2021   Thrombocytopenia affecting pregnancy, antepartum ([place]) 07/10/2021   Depressed mood 06/14/2021   History of cesarean delivery affecting pregnancy 06/14/2021   Prenatal care, subsequent pregnancy, second trimester 02/23/2021   Supervision of high risk pregnancy, antepartum 07/02/2019   PTSD (post-traumatic stress disorder) 01/08/2013   MDD (major depressive disorder), recurrent episode, severe ([place]) 01/08/2013    REFERRING DIAG: T14.8XXA (ICD-10-CM) - Nerve injury R26.2 (ICD-10-CM) - Difficulty walking  THERAPY DIAG:  Muscle weakness (generalized)  Difficulty in walking, not elsewhere classified  PERTINENT HISTORY: 09/14/2021 c-section with resulting nerve injury and difficulty walking  PRECAUTIONS: None  SUBJECTIVE: Patient states that she had pronounced return of RLE pain and inguinal swelling after participating in more vigorous Zumba class. Patient notes it took approximately 3-4 days for the swelling and pain to fully subside. Patient denies any other significant changes or concerns.  PAIN:  Are you having pain? Yes: NPRS scale: 2/10 TREATMENT  Neuromuscular Re-education: SLS balance and neural mobility interventions:  Hip extension, RTB, x20, BLE  Hip abduction-extension, RTB, BLE  TKE motor control, BTB, RLE, x30  Bulgarian split squat, to fatigue, BLE  Pilates based postural control interventions on [employer]:  Chest expansion in tall kneeling  Thigh stretch  Scapular row in tall kneeling   Patient educated throughout session on appropriate technique and form using multi-modal cueing, HEP, and activity modification. Patient articulated  understanding and returned demonstration.  Patient Response to interventions: Does not report increased pain     PT Long Term Goals - 05/09/22 1652       PT LONG TERM GOAL #1   Title Patient will be independent with HEP in order to improve strength and balance in order to decrease fall risk and improve function at home and in the community.    Baseline IE: not initiated; 2/7: IND    Time 6    Period Weeks    Status Achieved    Target Date 11/15/21      PT LONG TERM GOAL #2   Title Patient will decrease 5TSTS to < 12 seconds without UE support nor compensatory patterns in order to demonstrate clinically significant improvement in LE strength and to approach peer normative values.    Baseline IE: 63.4 sec (LLE bias); 2/7: intermittent UE support no bias 45.5 sec, LLE bias 20.5 sec; 3/8: 14.54 sec, UE support, no bias; 4/26: 10.96 sec, no UE support, no bias; 6/1: 9.82 sec, no UE support, no bias    Time 6    Period Weeks    Status Achieved    Target Date 01/30/22      PT LONG TERM GOAL #3   Title Patient will decrease TUG to below 14 seconds/decrease in order to demonstrate decreased fall risk.    Baseline IE: assess at next visit    Time 6    Period Weeks    Status Deferred      PT LONG TERM GOAL #4   Title Patient will increase by at least 53m (145ft) in order to demonstrate clinically significant improvement in cardiopulmonary endurance and community ambulation    Baseline IE: assess at next visit; 12/27: 2 min walk test: 56 ft, 5/10 pain (burning), RW, SBA, 2/7: deferred; 2/16: 538 ft, no AD, IND, 2/10 pain; 3/8: 784 ft, no AD, IND, 5/10 pain; 4/26: 1140 ft, no AD, IND, 3/10; 6/1: 1224 ft, no AD, IND, 5/10 pain    Time 6    Period Weeks    Status Achieved    Target Date 01/30/22      PT LONG TERM GOAL #5   Title Patient will demonstrate improved function as evidenced by a score of 68 on FOTO measure for full participation in activities at home and in the community.     Baseline IE: 50; 1/30: 58; 3/8: 64; 4/26: 69; 6/1: 69; 7/12: 73    Time 6    Period Weeks    Status Achieved    Target Date 01/30/22      PT LONG TERM GOAL #6   Title Patient will be able to participate in driving, dancing, and jumping activities without pain or limitation to demonstrate a return to PLOF and return to full participation at home and in the community.    Baseline 6/1: 7/10 dancing, 10/10 driving; 4/58: able to participate in driving, dancing and jumping still limited with increased pain post-activity for > 24 hours    Time 4    Period Weeks    Status On-going    Target Date 06/06/22               Plan - 05/15/22  1200     Clinical Impression Statement Patient presents to clinic with excellent motivation to participate in therapy. Patient continues to demonstrate deficits in postural control, gait, pain, and function although improving. Patient with improved R quad control during SLS interventions but did have one LOB with tall kneeling postural control interventions; overall patient continues to responde positively to active interventions. Patient will benefit from continued skilled therapeutic intervention to address remaining deficits in postural control, gait, pain, and function in order to increase function and improve overall QOL.    Personal Factors and Comorbidities Comorbidity 3+;Time since onset of injury/illness/exacerbation    Comorbidities anemia, asthma, depression, GERD, migraines, PTSD    Examination-Activity Limitations Bathing;Hygiene/Grooming;Toileting;Squat;Sleep;Transfers;Dressing;Stairs;Bend;Lift    Examination-Participation Restrictions Community Activity;Occupation;Driving;Meal Prep;Cleaning;Shop;Laundry    Stability/Clinical Decision Making Evolving/Moderate complexity    Rehab Potential Good    PT Frequency 1x / week    PT Duration 4 weeks    PT Treatment/Interventions ADLs/Self Care Home Management;Cryotherapy;Electrical Stimulation;Moist  Heat;Neuromuscular re-education;Therapeutic exercise;Stair training;Gait training;Patient/family education;Orthotic Fit/Training;Manual techniques;Scar mobilization;Taping;Joint Manipulations;Spinal Manipulations    PT Home Exercise Plan [id number]; [id number]    Consulted and Agree with Plan of Care Patient                Sheria Lang PT, DPT [phone]  05/15/2022, 12:01 PM

## 2022-05-15 NOTE — Progress Notes (Signed)
Hematology/Oncology Progress note [phone] [phone]         Patient Care Team: [facility], [facility] as PCP - General  REFERRING PROVIDER: [facility], [facility]  CHIEF COMPLAINTS/REASON FOR VISIT:  Follow up for iron deficiency anemia and vitamin B12 deficiency   INTERVAL HISTORY Nicole Hartman is a 26 y.o. female who has above history reviewed by me today presents for follow up visit for management of iron deficiency anemia and vitamin B12 deficiency Her right lower extremity weakness has improved.  She has received B12 injections in the past. Currently she take sublingual Vitamin B12.  + heavy menstrual period.    Review of Systems  Constitutional:  Negative for appetite change, chills, fatigue and fever.  HENT:   Negative for hearing loss and voice change.   Eyes:  Negative for eye problems.  Respiratory:  Negative for chest tightness and cough.   Cardiovascular:  Negative for chest pain.  Gastrointestinal:  Negative for abdominal distention, abdominal pain and blood in stool.  Endocrine: Negative for hot flashes.  Genitourinary:  Positive for menstrual problem. Negative for difficulty urinating and frequency.   Musculoskeletal:  Negative for arthralgias.  Skin:  Negative for itching and rash.  Neurological:  Negative for extremity weakness.  Hematological:  Negative for adenopathy.  Psychiatric/Behavioral:  Negative for confusion.     MEDICAL HISTORY:  Past Medical History:  Diagnosis Date   Anemia    Asthma    exercise induced   Cesarean delivery delivered 03/02/2016   Depression 2014   GERD (gastroesophageal reflux disease)    Headache    h/o migraines   PTSD (post-traumatic stress disorder) 2014   Thrombocytopenia (HCC)     SURGICAL HISTORY: Past Surgical History:  Procedure Laterality Date   CESAREAN SECTION N/A 03/01/2016   Procedure: CESAREAN SECTION;  Surgeon: Conard Novak, MD;  Location: [facility]  ORS;  Service: Obstetrics;  Laterality: N/A;   CESAREAN SECTION N/A 02/11/2020   Procedure: CESAREAN SECTION;  Surgeon: Conard Novak, MD;  Location: [facility] ORS;  Service: Obstetrics;  Laterality: N/A;  RNFA   CESAREAN SECTION WITH BILATERAL TUBAL LIGATION N/A 09/14/2021   Procedure: CESAREAN SECTION WITH BILATERAL TUBAL LIGATION;  Surgeon: Conard Novak, MD;  Location: [facility] ORS;  Service: Obstetrics;  Laterality: N/A;   COLONOSCOPY WITH PROPOFOL N/A 02/14/2022   Procedure: COLONOSCOPY WITH PROPOFOL;  Surgeon: Toney Reil, MD;  Location: [facility] ENDOSCOPY;  Service: Gastroenterology;  Laterality: N/A;   ESOPHAGOGASTRODUODENOSCOPY (EGD) WITH PROPOFOL N/A 02/14/2022   Procedure: ESOPHAGOGASTRODUODENOSCOPY (EGD) WITH PROPOFOL;  Surgeon: Toney Reil, MD;  Location: [facility] ENDOSCOPY;  Service: Gastroenterology;  Laterality: N/A;    SOCIAL HISTORY: Social History   Socioeconomic History   Marital status: Married    Spouse name: Rolm Gala   Number of children: 3   Years of education: college   Highest education level: Not on file  Occupational History   Occupation: Sales executive  Tobacco Use   Smoking status: Never   Smokeless tobacco: Never  Vaping Use   Vaping Use: Never used  Substance and Sexual Activity   Alcohol use: No   Drug use: No   Sexual activity: Yes    Partners: Male    Birth control/protection: Surgical  Other Topics Concern   Not on file  Social History Narrative   Lives at home with husband and three children.   Right-handed.   One cup caffeine per day.   Social Determinants of Health   Financial  Resource Strain: Not on file  Food Insecurity: Not on file  Transportation Needs: Not on file  Physical Activity: Not on file  Stress: Not on file  Social Connections: Not on file  Intimate Partner Violence: Not on file    FAMILY HISTORY: Family History  Problem Relation Age of Onset   Healthy Mother    Healthy Father    Cancer - Colon Maternal  Grandfather    Breast cancer Paternal Grandmother        71s   Breast cancer Maternal Aunt        not sure of age    ALLERGIES:  has No Known Allergies.  MEDICATIONS:  Current Outpatient Medications  Medication Sig Dispense Refill   calcium carbonate (OS-CAL) 1250 (500 Ca) MG chewable tablet Chew by mouth.     Cobalamin Combinations (B-12) [phone] MCG SUBL      cyclobenzaprine (FLEXERIL) 5 MG tablet      ferrous sulfate (FER-IN-SOL) 75 (15 Fe) MG/ML SOLN Take 1 tablet by mouth daily.     Ferrous Sulfate (IRON PO) Take 1 tablet by mouth daily.     fluticasone (FLONASE) 50 MCG/ACT nasal spray SHAKE LIQUID AND USE 2 SPRAYS IN EACH NOSTRIL DAILY     gabapentin (NEURONTIN) 300 MG capsule Take 300 mg by mouth daily.     No current facility-administered medications for this visit.     PHYSICAL EXAMINATION:  Vitals:   05/15/22 1419  BP: 109/66  Pulse: 84  Temp: 97.9 F (36.6 C)   Filed Weights   05/15/22 1419  Weight: 150 lb (68 kg)    Physical Exam Constitutional:      General: She is not in acute distress.    Comments: Patient ambulates independently  HENT:     Head: Normocephalic and atraumatic.  Eyes:     General: No scleral icterus. Cardiovascular:     Rate and Rhythm: Normal rate.  Pulmonary:     Effort: Pulmonary effort is normal. No respiratory distress.     Breath sounds: No wheezing.  Abdominal:     General: There is no distension.     Palpations: Abdomen is soft.  Musculoskeletal:        General: No deformity. Normal range of motion.     Cervical back: Normal range of motion and neck supple.  Skin:    General: Skin is warm and dry.     Findings: No erythema or rash.  Neurological:     Mental Status: She is alert and oriented to person, place, and time. Mental status is at baseline.     Cranial Nerves: No cranial nerve deficit.  Psychiatric:        Mood and Affect: Mood normal.      LABORATORY DATA:  I have reviewed the data as listed Lab  Results  Component Value Date   WBC 5.7 05/13/2022   HGB 12.8 05/13/2022   HCT 38.9 05/13/2022   MCV 87.6 05/13/2022   PLT 203 05/13/2022   No results for input(s): "NA", "K", "CL", "CO2", "GLUCOSE", "BUN", "CREATININE", "CALCIUM", "GFRNONAA", "GFRAA", "PROT", "ALBUMIN", "AST", "ALT", "ALKPHOS", "BILITOT", "BILIDIR", "IBILI" in the last 8760 hours. Iron/TIBC/Ferritin/ %Sat    Component Value Date/Time   IRON 60 05/13/2022 1326   TIBC 482 (H) 05/13/2022 1326   FERRITIN 6 (L) 05/13/2022 1326   IRONPCTSAT 13 05/13/2022 1326      RADIOGRAPHIC STUDIES: I have personally reviewed the radiological images as listed and agreed with the findings in the  report.  No results found.   ASSESSMENT & PLAN:  1. Other iron deficiency anemia   2. B12 deficiency   3. Pernicious anemia    Iron deficiency anemia  Labs are reviewed and discussed with patient. Normal hemoglobin, decreased ferritin.  Recommend IV Venofer Q2 weeks x 3.  Recommend patient to further discuss with her gyn for evaluation of heavy menses.   # Vitamin B12 deficiency/pernicious anemia [Parietal Cell Antibody-IgG + intrinsic antibody +] Status post parenteral vitamin B12 injections B12 has improved. S/p EGD and colonscopy.  Continue sublingual Vitamin B12.  Repeat B12 in 6 months, if not effective, will switch to B12 injections.     Orders Placed This Encounter  Procedures   CBC with Differential/Platelet    Standing Status:   Future    Standing Expiration Date:   05/16/2023   Vitamin B12    Standing Status:   Future    Standing Expiration Date:   05/16/2023   Ferritin    Standing Status:   Future    Standing Expiration Date:   11/15/2022   Iron and TIBC    Standing Status:   Future    Standing Expiration Date:   05/16/2023    All questions were answered. The patient knows to call the clinic with any problems questions or concerns.  [facility] [employer] [facility] [employer]*  Follow up in 6 months. Lab MD +/- Venofer +/-  B12  Rickard Patience, MD, PhD 05/15/2022

## 2022-05-15 NOTE — Patient Instructions (Signed)

## 2022-05-22 MED FILL — Iron Sucrose Inj 20 MG/ML (Fe Equiv): INTRAVENOUS | Qty: 10 | Status: AC

## 2022-05-23 ENCOUNTER — Inpatient Hospital Stay: Payer: Medicaid Other

## 2022-05-27 ENCOUNTER — Inpatient Hospital Stay: Payer: Medicaid Other

## 2022-05-27 VITALS — BP 114/63 | HR 72 | Temp 96.8°F | Resp 20

## 2022-05-27 DIAGNOSIS — E538 Deficiency of other specified B group vitamins: Secondary | ICD-10-CM | POA: Diagnosis not present

## 2022-05-27 DIAGNOSIS — N920 Excessive and frequent menstruation with regular cycle: Secondary | ICD-10-CM | POA: Diagnosis not present

## 2022-05-27 DIAGNOSIS — Z800 Family history of malignant neoplasm of digestive organs: Secondary | ICD-10-CM | POA: Diagnosis not present

## 2022-05-27 DIAGNOSIS — Z79899 Other long term (current) drug therapy: Secondary | ICD-10-CM | POA: Diagnosis not present

## 2022-05-27 DIAGNOSIS — Z803 Family history of malignant neoplasm of breast: Secondary | ICD-10-CM | POA: Diagnosis not present

## 2022-05-27 DIAGNOSIS — D508 Other iron deficiency anemias: Secondary | ICD-10-CM | POA: Diagnosis not present

## 2022-05-27 DIAGNOSIS — R531 Weakness: Secondary | ICD-10-CM | POA: Diagnosis not present

## 2022-05-27 MED ORDER — SODIUM CHLORIDE 0.9 % IV SOLN
INTRAVENOUS | Status: DC
  Filled 2022-05-27: qty 250

## 2022-05-27 MED ORDER — SODIUM CHLORIDE 0.9 % IV SOLN
200.0000 mg | Freq: Once | INTRAVENOUS | Status: AC
  Administered 2022-05-27: 200 mg via INTRAVENOUS
  Filled 2022-05-27: qty 200

## 2022-05-28 MED FILL — Iron Sucrose Inj 20 MG/ML (Fe Equiv): INTRAVENOUS | Qty: 10 | Status: AC

## 2022-05-29 ENCOUNTER — Ambulatory Visit: Payer: Medicaid Other | Admitting: Physical Therapy

## 2022-05-29 ENCOUNTER — Inpatient Hospital Stay: Payer: Medicaid Other

## 2022-05-29 ENCOUNTER — Encounter: Payer: Self-pay | Admitting: Physical Therapy

## 2022-05-29 DIAGNOSIS — Z113 Encounter for screening for infections with a predominantly sexual mode of transmission: Secondary | ICD-10-CM | POA: Diagnosis not present

## 2022-05-29 DIAGNOSIS — Z124 Encounter for screening for malignant neoplasm of cervix: Secondary | ICD-10-CM | POA: Diagnosis not present

## 2022-05-29 DIAGNOSIS — R262 Difficulty in walking, not elsewhere classified: Secondary | ICD-10-CM

## 2022-05-29 DIAGNOSIS — Z309 Encounter for contraceptive management, unspecified: Secondary | ICD-10-CM | POA: Diagnosis not present

## 2022-05-29 DIAGNOSIS — M6281 Muscle weakness (generalized): Secondary | ICD-10-CM | POA: Diagnosis not present

## 2022-05-29 DIAGNOSIS — Z1389 Encounter for screening for other disorder: Secondary | ICD-10-CM | POA: Diagnosis not present

## 2022-05-29 DIAGNOSIS — D509 Iron deficiency anemia, unspecified: Secondary | ICD-10-CM | POA: Diagnosis not present

## 2022-05-29 NOTE — Therapy (Signed)
OUTPATIENT PHYSICAL THERAPY TREATMENT NOTE    Patient Name: Nicole Hartman MRN: [id number] DOB:1996-04-08, 26 y.o., female Today's Date: 05/29/2022  PCP: [facility], [facility] REFERRING PROVIDER: Natale Milch, *   PT End of Session - 05/29/22 1119     Visit Number 31    Number of Visits 31    Date for PT Re-Evaluation 06/06/22    PT Start Time 1115    PT Stop Time 1155    PT Time Calculation (min) 40 min    Activity Tolerance Patient tolerated treatment well    Behavior During Therapy [facility] for tasks assessed/performed             Past Medical History:  Diagnosis Date   Anemia    Asthma    exercise induced   Cesarean delivery delivered 03/02/2016   Depression 2014   GERD (gastroesophageal reflux disease)    Headache    h/o migraines   PTSD (post-traumatic stress disorder) 2014   Thrombocytopenia (HCC)    Past Surgical History:  Procedure Laterality Date   CESAREAN SECTION N/A 03/01/2016   Procedure: CESAREAN SECTION;  Surgeon: Conard Novak, MD;  Location: [facility] ORS;  Service: Obstetrics;  Laterality: N/A;   CESAREAN SECTION N/A 02/11/2020   Procedure: CESAREAN SECTION;  Surgeon: Conard Novak, MD;  Location: [facility] ORS;  Service: Obstetrics;  Laterality: N/A;  RNFA   CESAREAN SECTION WITH BILATERAL TUBAL LIGATION N/A 09/14/2021   Procedure: CESAREAN SECTION WITH BILATERAL TUBAL LIGATION;  Surgeon: Conard Novak, MD;  Location: [facility] ORS;  Service: Obstetrics;  Laterality: N/A;   COLONOSCOPY WITH PROPOFOL N/A 02/14/2022   Procedure: COLONOSCOPY WITH PROPOFOL;  Surgeon: Toney Reil, MD;  Location: [facility] ENDOSCOPY;  Service: Gastroenterology;  Laterality: N/A;   ESOPHAGOGASTRODUODENOSCOPY (EGD) WITH PROPOFOL N/A 02/14/2022   Procedure: ESOPHAGOGASTRODUODENOSCOPY (EGD) WITH PROPOFOL;  Surgeon: Toney Reil, MD;  Location: [facility] ENDOSCOPY;  Service: Gastroenterology;  Laterality: N/A;   Patient Active Problem List   Diagnosis  Date Noted   Numbness 02/26/2022   Rectal bleeding    Polyp of sigmoid colon    Pernicious anemia 11/13/2021   History of cesarean delivery 09/14/2021   Back pain affecting pregnancy in third trimester 08/27/2021   B12 deficiency 07/13/2021   Thrombocytopenia affecting pregnancy, antepartum (HCC) 07/10/2021   Depressed mood 06/14/2021   History of cesarean delivery affecting pregnancy 06/14/2021   Prenatal care, subsequent pregnancy, second trimester 02/23/2021   Supervision of high risk pregnancy, antepartum 07/02/2019   PTSD (post-traumatic stress disorder) 01/08/2013   MDD (major depressive disorder), recurrent episode, severe (HCC) 01/08/2013    REFERRING DIAG: T14.8XXA (ICD-10-CM) - Nerve injury R26.2 (ICD-10-CM) - Difficulty walking  THERAPY DIAG:  Muscle weakness (generalized)  Difficulty in walking, not elsewhere classified  PERTINENT HISTORY: 09/14/2021 c-section with resulting nerve injury and difficulty walking  PRECAUTIONS: None  SUBJECTIVE: Patient reports that she continues to return to baseline activities but has struggles with foot clearance with increased repetitions. Patient also notes difficulty coordinating faster paced activities like dance steps. Swelling also still occurs. PAIN:  Are you having pain? Yes: NPRS scale: 0/10 TREATMENT  Neuromuscular Re-education: Footwork and coordination tasks in hallway with emphasis on body mechanics for decreased compression of R inguinal region: Cone weaving Cone touches fwd/bwd Prone postural control interventions:  Hip extension  UE reach  Swimmers Hip flexor stretches for improved neural mobility and pelvic position   Patient educated throughout session on appropriate technique and form using multi-modal cueing,  HEP, and activity modification. Patient articulated understanding and returned demonstration.  Patient Response to interventions: Notes she can feel that less tightness in R anterior thigh     PT  Long Term Goals - 05/09/22 1652       PT LONG TERM GOAL #1   Title Patient will be independent with HEP in order to improve strength and balance in order to decrease fall risk and improve function at home and in the community.    Baseline IE: not initiated; 2/7: IND    Time 6    Period Weeks    Status Achieved    Target Date 11/15/21      PT LONG TERM GOAL #2   Title Patient will decrease 5TSTS to < 12 seconds without UE support nor compensatory patterns in order to demonstrate clinically significant improvement in LE strength and to approach peer normative values.    Baseline IE: 63.4 sec (LLE bias); 2/7: intermittent UE support no bias 45.5 sec, LLE bias 20.5 sec; 3/8: 14.54 sec, UE support, no bias; 4/26: 10.96 sec, no UE support, no bias; 6/1: 9.82 sec, no UE support, no bias    Time 6    Period Weeks    Status Achieved    Target Date 01/30/22      PT LONG TERM GOAL #3   Title Patient will decrease TUG to below 14 seconds/decrease in order to demonstrate decreased fall risk.    Baseline IE: assess at next visit    Time 6    Period Weeks    Status Deferred      PT LONG TERM GOAL #4   Title Patient will increase by at least 33m (158ft) in order to demonstrate clinically significant improvement in cardiopulmonary endurance and community ambulation    Baseline IE: assess at next visit; 12/27: 2 min walk test: 56 ft, 5/10 pain (burning), RW, SBA, 2/7: deferred; 2/16: 538 ft, no AD, IND, 2/10 pain; 3/8: 784 ft, no AD, IND, 5/10 pain; 4/26: 1140 ft, no AD, IND, 3/10; 6/1: 1224 ft, no AD, IND, 5/10 pain    Time 6    Period Weeks    Status Achieved    Target Date 01/30/22      PT LONG TERM GOAL #5   Title Patient will demonstrate improved function as evidenced by a score of 68 on FOTO measure for full participation in activities at home and in the community.    Baseline IE: 50; 1/30: 58; 3/8: 64; 4/26: 69; 6/1: 69; 7/12: 73    Time 6    Period Weeks    Status Achieved     Target Date 01/30/22      PT LONG TERM GOAL #6   Title Patient will be able to participate in driving, dancing, and jumping activities without pain or limitation to demonstrate a return to PLOF and return to full participation at home and in the community.    Baseline 6/1: 7/10 dancing, 10/10 driving; 7/61: able to participate in driving, dancing and jumping still limited with increased pain post-activity for > 24 hours    Time 4    Period Weeks    Status On-going    Target Date 06/06/22               Plan - 05/29/22 1540     Clinical Impression Statement Patient presents to clinic with excellent motivation to participate in therapy. Patient continues to demonstrate deficits in postural control, gait, pain, and function  although improving. Patient with successively faster time with coordination trials focused on footwork and responded positively to prone hip extension interventions. Patient will benefit from continued skilled therapeutic intervention to address remaining deficits in postural control, gait, pain, and function in order to increase function and improve overall QOL.    Personal Factors and Comorbidities Comorbidity 3+;Time since onset of injury/illness/exacerbation    Comorbidities anemia, asthma, depression, GERD, migraines, PTSD    Examination-Activity Limitations Bathing;Hygiene/Grooming;Toileting;Squat;Sleep;Transfers;Dressing;Stairs;Bend;Lift    Examination-Participation Restrictions Community Activity;Occupation;Driving;Meal Prep;Cleaning;Shop;Laundry    Stability/Clinical Decision Making Evolving/Moderate complexity    Rehab Potential Good    PT Frequency 1x / week    PT Duration 4 weeks    PT Treatment/Interventions ADLs/Self Care Home Management;Cryotherapy;Electrical Stimulation;Moist Heat;Neuromuscular re-education;Therapeutic exercise;Stair training;Gait training;Patient/family education;Orthotic Fit/Training;Manual techniques;Scar mobilization;Taping;Joint  Manipulations;Spinal Manipulations    PT Home Exercise Plan [email or web address]; [id number]    Consulted and Agree with Plan of Care Patient                 Sheria Lang PT, DPT [phone]  05/29/2022, 3:41 PM

## 2022-05-30 ENCOUNTER — Other Ambulatory Visit (HOSPITAL_COMMUNITY): Payer: Self-pay | Admitting: Family Medicine

## 2022-05-30 ENCOUNTER — Other Ambulatory Visit: Payer: Self-pay | Admitting: Family Medicine

## 2022-05-30 DIAGNOSIS — D500 Iron deficiency anemia secondary to blood loss (chronic): Secondary | ICD-10-CM

## 2022-05-31 ENCOUNTER — Ambulatory Visit: Payer: Medicaid Other | Admitting: Physical Therapy

## 2022-05-31 MED FILL — Iron Sucrose Inj 20 MG/ML (Fe Equiv): INTRAVENOUS | Qty: 10 | Status: AC

## 2022-06-03 ENCOUNTER — Other Ambulatory Visit: Payer: Self-pay | Admitting: Family Medicine

## 2022-06-03 ENCOUNTER — Other Ambulatory Visit: Payer: Self-pay

## 2022-06-03 ENCOUNTER — Inpatient Hospital Stay: Payer: Medicaid Other

## 2022-06-03 DIAGNOSIS — N920 Excessive and frequent menstruation with regular cycle: Secondary | ICD-10-CM

## 2022-06-05 ENCOUNTER — Ambulatory Visit: Payer: Medicaid Other | Admitting: Physical Therapy

## 2022-06-10 ENCOUNTER — Telehealth: Payer: Self-pay | Admitting: Gastroenterology

## 2022-06-10 ENCOUNTER — Encounter: Payer: Self-pay | Admitting: Physical Therapy

## 2022-06-10 ENCOUNTER — Ambulatory Visit: Payer: Medicaid Other | Admitting: Gastroenterology

## 2022-06-10 ENCOUNTER — Ambulatory Visit: Payer: Medicaid Other | Admitting: Physical Therapy

## 2022-06-10 DIAGNOSIS — R262 Difficulty in walking, not elsewhere classified: Secondary | ICD-10-CM

## 2022-06-10 DIAGNOSIS — M6281 Muscle weakness (generalized): Secondary | ICD-10-CM

## 2022-06-10 NOTE — Telephone Encounter (Signed)
Did not send her a letter about getting a colonoscopy

## 2022-06-10 NOTE — Telephone Encounter (Signed)
Patient called stating that she had left a vm Friday at 9:51am to cancel appointment for today.   Patient also has questions about the letter Morrie Sheldon sent about getting a colonoscopy. States she just had a colonoscopy.

## 2022-06-10 NOTE — Therapy (Signed)
OUTPATIENT PHYSICAL THERAPY TREATMENT NOTE    Patient Name: Nicole Hartman MRN: [id number] DOB:03/03/1996, 26 y.o., female Today's Date: 06/10/2022  [facility]P: [facility] REFERRING PROVIDER: Natale Milch, *   PT End of Session - 06/10/22 1749     Visit Number 32    Number of Visits 35    Date for PT Re-Evaluation 07/08/22    PT Start Time 1645    PT Stop Time 1725    PT Time Calculation (min) 40 min    Activity Tolerance Patient tolerated treatment well    Behavior During Therapy [facility] for tasks assessed/performed             Past Medical History:  Diagnosis Date   Anemia    Asthma    exercise induced   Cesarean delivery delivered 03/02/2016   Depression 2014   GERD (gastroesophageal reflux disease)    Headache    h/o migraines   PTSD (post-traumatic stress disorder) 2014   Thrombocytopenia (HCC)    Past Surgical History:  Procedure Laterality Date   CESAREAN SECTION N/A 03/01/2016   Procedure: CESAREAN SECTION;  Surgeon: Conard Novak, MD;  Location: [facility] ORS;  Service: Obstetrics;  Laterality: N/A;   CESAREAN SECTION N/A 02/11/2020   Procedure: CESAREAN SECTION;  Surgeon: Conard Novak, MD;  Location: [facility] ORS;  Service: Obstetrics;  Laterality: N/A;  RNFA   CESAREAN SECTION WITH BILATERAL TUBAL LIGATION N/A 09/14/2021   Procedure: CESAREAN SECTION WITH BILATERAL TUBAL LIGATION;  Surgeon: Conard Novak, MD;  Location: [facility] ORS;  Service: Obstetrics;  Laterality: N/A;   COLONOSCOPY WITH PROPOFOL N/A 02/14/2022   Procedure: COLONOSCOPY WITH PROPOFOL;  Surgeon: Toney Reil, MD;  Location: [facility] ENDOSCOPY;  Service: Gastroenterology;  Laterality: N/A;   ESOPHAGOGASTRODUODENOSCOPY (EGD) WITH PROPOFOL N/A 02/14/2022   Procedure: ESOPHAGOGASTRODUODENOSCOPY (EGD) WITH PROPOFOL;  Surgeon: Toney Reil, MD;  Location: [facility] ENDOSCOPY;  Service: Gastroenterology;  Laterality: N/A;   Patient Active Problem List   Diagnosis  Date Noted   Numbness 02/26/2022   Rectal bleeding    Polyp of sigmoid colon    Pernicious anemia 11/13/2021   History of cesarean delivery 09/14/2021   Back pain affecting pregnancy in third trimester 08/27/2021   B12 deficiency 07/13/2021   Thrombocytopenia affecting pregnancy, antepartum (HCC) 07/10/2021   Depressed mood 06/14/2021   History of cesarean delivery affecting pregnancy 06/14/2021   Prenatal care, subsequent pregnancy, second trimester 02/23/2021   Supervision of high risk pregnancy, antepartum 07/02/2019   PTSD (post-traumatic stress disorder) 01/08/2013   MDD (major depressive disorder), recurrent episode, severe (HCC) 01/08/2013    REFERRING DIAG: T14.8XXA (ICD-10-CM) - Nerve injury R26.2 (ICD-10-CM) - Difficulty walking  THERAPY DIAG:  Muscle weakness (generalized)  Difficulty in walking, not elsewhere classified  PERTINENT HISTORY: 09/14/2021 c-section with resulting nerve injury and difficulty walking  PRECAUTIONS: None  SUBJECTIVE: Patient notes with return to school and increased driving to/from [place], her leg pain has increased. Patient is also now having back of leg pain. Patient also notes that when playing with her children, she feels it more difficult to keep up. PAIN:  Are you having pain? Yes: NPRS scale: 4/10 TREATMENT Manual Therapy: STM and TPR performed to B lumbar paraspinals to allow for decreased tension and pain and improved posture and function Mobilizations of lumbar spine, CPA/UPA for decreased spasm and improved mobility, grade II/III   Therapeutic Exercise: Prone prop with TrA x10-15 reps Multifidi activation with LE, x10 reps each side Multifidi  activation with UE, x 10 reps each side Prone hip flexor stretch Modified Thomas stretch Sciatic nerve glides, supine hooklying  Patient educated throughout session on appropriate technique and form using multi-modal cueing, HEP, and activity modification. Patient articulated  understanding and returned demonstration.  Patient Response to interventions:      PT Long Term Goals - 06/10/22 1754       PT LONG TERM GOAL #1   Title Patient will be independent with HEP in order to improve strength and balance in order to decrease fall risk and improve function at home and in the community.    Baseline IE: not initiated; 2/7: IND    Time 6    Period Weeks    Status Achieved    Target Date 11/15/21      PT LONG TERM GOAL #2   Title Patient will decrease 5TSTS to < 12 seconds without UE support nor compensatory patterns in order to demonstrate clinically significant improvement in LE strength and to approach peer normative values.    Baseline IE: 63.4 sec (LLE bias); 2/7: intermittent UE support no bias 45.5 sec, LLE bias 20.5 sec; 3/8: 14.54 sec, UE support, no bias; 4/26: 10.96 sec, no UE support, no bias; 6/1: 9.82 sec, no UE support, no bias    Time 6    Period Weeks    Status Achieved    Target Date 01/30/22      PT LONG TERM GOAL #3   Title Patient will decrease TUG to below 14 seconds/decrease in order to demonstrate decreased fall risk.    Baseline IE: assess at next visit    Time 6    Period Weeks    Status Deferred      PT LONG TERM GOAL #4   Title Patient will increase by at least 24m (178ft) in order to demonstrate clinically significant improvement in cardiopulmonary endurance and community ambulation    Baseline IE: assess at next visit; 12/27: 2 min walk test: 56 ft, 5/10 pain (burning), RW, SBA, 2/7: deferred; 2/16: 538 ft, no AD, IND, 2/10 pain; 3/8: 784 ft, no AD, IND, 5/10 pain; 4/26: 1140 ft, no AD, IND, 3/10; 6/1: 1224 ft, no AD, IND, 5/10 pain    Time 6    Period Weeks    Status Achieved    Target Date 01/30/22      PT LONG TERM GOAL #5   Title Patient will demonstrate improved function as evidenced by a score of 68 on FOTO measure for full participation in activities at home and in the community.    Baseline IE: 50; 1/30: 58;  3/8: 64; 4/26: 69; 6/1: 69; 7/12: 73    Time 6    Period Weeks    Status Achieved    Target Date 01/30/22      PT LONG TERM GOAL #6   Title Patient will be able to participate in driving, dancing, and jumping activities without pain or limitation to demonstrate a return to PLOF and return to full participation at home and in the community.    Baseline 6/1: 7/10 dancing, 10/10 driving; 7/40: able to participate in driving, dancing and jumping still limited with increased pain post-activity for > 24 hours; 8/28: 4/10 pain with driving 1 hr +, dancing and jumping limited in speed    Time 4    Period Weeks    Status On-going    Target Date 07/08/22  Plan - 06/10/22 1755     Clinical Impression Statement Patient presents to clinic with excellent motivation to participate in therapy. Patient demonstrates persistent deficits in postural control, pain, and function albeit with continued slow improvement. Patient indicating 4/10 pain with driving to and from school, and continues to feel limited with fitness activities and caregiving activities with children. Patient has actively participated in her care both in clinic and at home but continues to be limited compared to her PLOF. Patient will benefit from continued skilled therapeutic intervention to address remaining deficits in postural control, gait, pain, and function in order to increase function and improve overall QOL.    Personal Factors and Comorbidities Comorbidity 3+;Time since onset of injury/illness/exacerbation    Comorbidities anemia, asthma, depression, GERD, migraines, PTSD    Examination-Activity Limitations Bathing;Hygiene/Grooming;Toileting;Squat;Sleep;Transfers;Dressing;Stairs;Bend;Lift    Examination-Participation Restrictions Community Activity;Occupation;Driving;Meal Prep;Cleaning;Shop;Laundry    Stability/Clinical Decision Making Evolving/Moderate complexity    Rehab Potential Good    PT Frequency 1x / week     PT Duration 4 weeks    PT Treatment/Interventions ADLs/Self Care Home Management;Cryotherapy;Electrical Stimulation;Moist Heat;Neuromuscular re-education;Therapeutic exercise;Stair training;Gait training;Patient/family education;Orthotic Fit/Training;Manual techniques;Scar mobilization;Taping;Joint Manipulations;Spinal Manipulations    PT Home Exercise Plan [email or web address]; [id number]    Consulted and Agree with Plan of Care Patient                  Sheria Lang PT, DPT [phone]  06/10/2022, 5:58 PM

## 2022-06-12 ENCOUNTER — Ambulatory Visit: Payer: Medicaid Other

## 2022-06-14 ENCOUNTER — Encounter: Payer: Self-pay | Admitting: Licensed Practical Nurse

## 2022-06-14 DIAGNOSIS — R200 Anesthesia of skin: Secondary | ICD-10-CM | POA: Diagnosis not present

## 2022-06-14 DIAGNOSIS — M25551 Pain in right hip: Secondary | ICD-10-CM | POA: Diagnosis not present

## 2022-06-14 DIAGNOSIS — D510 Vitamin B12 deficiency anemia due to intrinsic factor deficiency: Secondary | ICD-10-CM | POA: Diagnosis not present

## 2022-06-14 DIAGNOSIS — R202 Paresthesia of skin: Secondary | ICD-10-CM | POA: Diagnosis not present

## 2022-06-19 ENCOUNTER — Ambulatory Visit
Admission: RE | Admit: 2022-06-19 | Discharge: 2022-06-19 | Disposition: A | Discharge status: Home / Self Care | Payer: Medicaid Other | Source: Ambulatory Visit | Attending: Family Medicine | Admitting: Family Medicine

## 2022-06-19 DIAGNOSIS — N920 Excessive and frequent menstruation with regular cycle: Secondary | ICD-10-CM | POA: Diagnosis not present

## 2022-06-26 ENCOUNTER — Encounter: Payer: Self-pay | Admitting: Physical Therapy

## 2022-07-01 ENCOUNTER — Encounter: Payer: Self-pay | Admitting: Physical Therapy

## 2022-07-01 ENCOUNTER — Ambulatory Visit: Payer: Medicaid Other | Attending: Obstetrics and Gynecology | Admitting: Physical Therapy

## 2022-07-01 DIAGNOSIS — R262 Difficulty in walking, not elsewhere classified: Secondary | ICD-10-CM | POA: Insufficient documentation

## 2022-07-01 DIAGNOSIS — M6281 Muscle weakness (generalized): Secondary | ICD-10-CM | POA: Insufficient documentation

## 2022-07-01 NOTE — Therapy (Signed)
OUTPATIENT PHYSICAL THERAPY TREATMENT NOTE    Patient Name: Nicole Hartman MRN: [id number] DOB:1996/04/30, 26 y.o., female Today's Date: 07/01/2022  PCP: [facility], [facility] PROVIDER: Homero Fellers, *   PT End of Session - 07/01/22 1601     Visit Number 33    Number of Visits 35    Date for PT Re-Evaluation 07/08/22    PT Start Time 1555    PT Stop Time 1635    PT Time Calculation (min) 40 min    Activity Tolerance Patient tolerated treatment well    Behavior During Therapy [facility] for tasks assessed/performed             Past Medical History:  Diagnosis Date   Anemia    Asthma    exercise induced   Cesarean delivery delivered 03/02/2016   Depression 2014   GERD (gastroesophageal reflux disease)    Headache    h/o migraines   PTSD (post-traumatic stress disorder) 2014   Thrombocytopenia ([place])    Past Surgical History:  Procedure Laterality Date   CESAREAN SECTION N/A 03/01/2016   Procedure: CESAREAN SECTION;  Surgeon: Will Bonnet, MD;  Location: [facility] ORS;  Service: Obstetrics;  Laterality: N/A;   CESAREAN SECTION N/A 02/11/2020   Procedure: CESAREAN SECTION;  Surgeon: Will Bonnet, MD;  Location: [facility] ORS;  Service: Obstetrics;  Laterality: N/A;  RNFA   CESAREAN SECTION WITH BILATERAL TUBAL LIGATION N/A 09/14/2021   Procedure: CESAREAN SECTION WITH BILATERAL TUBAL LIGATION;  Surgeon: Will Bonnet, MD;  Location: [facility] ORS;  Service: Obstetrics;  Laterality: N/A;   COLONOSCOPY WITH PROPOFOL N/A 02/14/2022   Procedure: COLONOSCOPY WITH PROPOFOL;  Surgeon: Lin Landsman, MD;  Location: [facility] ENDOSCOPY;  Service: Gastroenterology;  Laterality: N/A;   ESOPHAGOGASTRODUODENOSCOPY (EGD) WITH PROPOFOL N/A 02/14/2022   Procedure: ESOPHAGOGASTRODUODENOSCOPY (EGD) WITH PROPOFOL;  Surgeon: Lin Landsman, MD;  Location: [facility] ENDOSCOPY;  Service: Gastroenterology;  Laterality: N/A;   Patient Active Problem List   Diagnosis  Date Noted   Numbness 02/26/2022   Rectal bleeding    Polyp of sigmoid colon    Pernicious anemia 11/13/2021   History of cesarean delivery 09/14/2021   Back pain affecting pregnancy in third trimester 08/27/2021   B12 deficiency 07/13/2021   Thrombocytopenia affecting pregnancy, antepartum ([place]) 07/10/2021   Depressed mood 06/14/2021   History of cesarean delivery affecting pregnancy 06/14/2021   Prenatal care, subsequent pregnancy, second trimester 02/23/2021   Supervision of high risk pregnancy, antepartum 07/02/2019   PTSD (post-traumatic stress disorder) 01/08/2013   MDD (major depressive disorder), recurrent episode, severe ([place]) 01/08/2013    REFERRING DIAG: T14.8XXA (ICD-10-CM) - Nerve injury R26.2 (ICD-10-CM) - Difficulty walking  THERAPY DIAG:  Muscle weakness (generalized)  Difficulty in walking, not elsewhere classified  PERTINENT HISTORY: 09/14/2021 c-section with resulting nerve injury and difficulty walking  PRECAUTIONS: None  SUBJECTIVE: Patient notes that she has had some regression in symptoms likely attributable to increased driving and walking demands with return to school. Patient notes with most recent menstrual cycle, RLE pain has increased. Patient notes difficulty with sitting on stool at work 2/2 to increased pain (worst pain 10/10) PAIN:  Are you having pain? Yes: NPRS scale: 3/10 TREATMENT Manual Therapy:   Therapeutic Exercise: Reverse ambulation, treadmill, x3 min warm up Standing quad stretch, B Standing hamstring stretch, B Standing hip 3 way, B Reassessed FOTO.  Patient educated throughout session on appropriate technique and form using multi-modal cueing, HEP, and activity modification. Patient articulated  understanding and returned demonstration.  Patient Response to interventions:      PT Long Term Goals - 07/01/22 1731       PT LONG TERM GOAL #1   Title Patient will be independent with HEP in order to improve strength and  balance in order to decrease fall risk and improve function at home and in the community.    Baseline IE: not initiated; 2/7: IND    Time 6    Period Weeks    Status Achieved    Target Date 11/15/21      PT LONG TERM GOAL #2   Title Patient will decrease 5TSTS to < 12 seconds without UE support nor compensatory patterns in order to demonstrate clinically significant improvement in LE strength and to approach peer normative values.    Baseline IE: 63.4 sec (LLE bias); 2/7: intermittent UE support no bias 45.5 sec, LLE bias 20.5 sec; 3/8: 14.54 sec, UE support, no bias; 4/26: 10.96 sec, no UE support, no bias; 6/1: 9.82 sec, no UE support, no bias    Time 6    Period Weeks    Status Achieved    Target Date 01/30/22      PT LONG TERM GOAL #3   Title Patient will decrease TUG to below 14 seconds/decrease in order to demonstrate decreased fall risk.    Baseline IE: assess at next visit    Time 6    Period Weeks    Status Deferred      PT LONG TERM GOAL #4   Title Patient will increase 6MWT by at least 85m (12ft) in order to demonstrate clinically significant improvement in cardiopulmonary endurance and community ambulation    Baseline IE: assess at next visit; 12/27: 2 min walk test: 56 ft, 5/10 pain (burning), RW, SBA, 2/7: deferred; 2/16: 538 ft, no AD, IND, 2/10 pain; 3/8: 784 ft, no AD, IND, 5/10 pain; 4/26: 1140 ft, no AD, IND, 3/10; 6/1: 1224 ft, no AD, IND, 5/10 pain    Time 6    Period Weeks    Status Achieved    Target Date 01/30/22      PT LONG TERM GOAL #5   Title Patient will demonstrate improved function as evidenced by a score of 68 on FOTO measure for full participation in activities at home and in the community.    Baseline IE: 50; 1/30: 58; 3/8: 64; 4/26: 69; 6/1: 69; 7/12: 73; 9/18: 63    Time 6    Period Weeks    Status Partially Met    Target Date 01/30/22      PT LONG TERM GOAL #6   Title Patient will be able to participate in driving, dancing, and jumping  activities without pain or limitation to demonstrate a return to PLOF and return to full participation at home and in the community.    Baseline 6/1: 7/10 dancing, 10/10 driving; 0/76: able to participate in driving, dancing and jumping still limited with increased pain post-activity for > 24 hours; 8/28: 4/10 pain with driving 1 hr +, dancing and jumping limited in speed    Time 4    Period Weeks    Status On-going    Target Date 07/08/22               Plan - 07/01/22 1606     Clinical Impression Statement Patient presents to clinic with excellent motivation to participate in therapy. Patient demonstrates persistent deficits in postural control, pain, and function albeit  with continued slow improvement. Patient able to perform standing stretches and hip strengthening despite increased pain during today's session and responded positively to active interventions. Despite patient's persistent effort, her pain and function continue to limit her progress in combination with her caregiving and occupational demands. Patient may benefit from continued skilled therapeutic intervention to address remaining deficits in postural control, gait, pain, and function in order to increase function and improve overall QOL, but we have decided to await results from consult with general surgery before proceeding with more physical therapy.    Personal Factors and Comorbidities Comorbidity 3+;Time since onset of injury/illness/exacerbation    Comorbidities anemia, asthma, depression, GERD, migraines, PTSD    Examination-Activity Limitations Bathing;Hygiene/Grooming;Toileting;Squat;Sleep;Transfers;Dressing;Stairs;Bend;Lift    Examination-Participation Restrictions Community Activity;Occupation;Driving;Meal Prep;Cleaning;Shop;Laundry    Stability/Clinical Decision Making Evolving/Moderate complexity    Rehab Potential Good    PT Frequency 1x / week    PT Duration 4 weeks    PT Treatment/Interventions ADLs/Self Care  Home Management;Cryotherapy;Electrical Stimulation;Moist Heat;Neuromuscular re-education;Therapeutic exercise;Stair training;Gait training;Patient/family education;Orthotic Fit/Training;Manual techniques;Scar mobilization;Taping;Joint Manipulations;Spinal Manipulations    PT Home Exercise Plan [id number]; [id number]    Consulted and Agree with Plan of Care Patient                   Myles Gip PT, DPT [phone]  07/01/2022, 5:34 PM

## 2022-07-23 DIAGNOSIS — R109 Unspecified abdominal pain: Secondary | ICD-10-CM | POA: Diagnosis not present

## 2022-07-31 DIAGNOSIS — H6983 Other specified disorders of Eustachian tube, bilateral: Secondary | ICD-10-CM | POA: Diagnosis not present

## 2022-07-31 DIAGNOSIS — J301 Allergic rhinitis due to pollen: Secondary | ICD-10-CM | POA: Diagnosis not present

## 2022-07-31 DIAGNOSIS — H6121 Impacted cerumen, right ear: Secondary | ICD-10-CM | POA: Diagnosis not present

## 2022-07-31 DIAGNOSIS — H93291 Other abnormal auditory perceptions, right ear: Secondary | ICD-10-CM | POA: Diagnosis not present

## 2022-10-21 ENCOUNTER — Other Ambulatory Visit: Payer: Self-pay

## 2022-10-21 ENCOUNTER — Ambulatory Visit: Payer: Medicaid Other | Admitting: Gastroenterology

## 2022-10-21 ENCOUNTER — Encounter: Payer: Self-pay | Admitting: Gastroenterology

## 2022-10-21 VITALS — BP 101/68 | HR 67 | Temp 98.2°F | Ht 60.0 in | Wt 154.0 lb

## 2022-10-21 DIAGNOSIS — K219 Gastro-esophageal reflux disease without esophagitis: Secondary | ICD-10-CM | POA: Diagnosis not present

## 2022-10-21 DIAGNOSIS — K641 Second degree hemorrhoids: Secondary | ICD-10-CM

## 2022-10-21 DIAGNOSIS — D510 Vitamin B12 deficiency anemia due to intrinsic factor deficiency: Secondary | ICD-10-CM | POA: Diagnosis not present

## 2022-10-21 DIAGNOSIS — K5904 Chronic idiopathic constipation: Secondary | ICD-10-CM | POA: Diagnosis not present

## 2022-10-21 NOTE — Progress Notes (Signed)
Arlyss Repress, MD [address]  [address]  [place], [place] [address]  Main: [phone]  Fax: [phone]    Gastroenterology Consultation  Referring Provider:     [facility], [facility]* Primary Care Physician:  [facility], [facility] Primary Gastroenterologist:  Dr. Arlyss Repress Reason for Consultation:   Symptomatic hemorrhoids, pernicious anemia        HPI:   Nicole Hartman is a 27 y.o. female referred by [facility], [facility]  for consultation & management of pernicious anemia.  Patient has history of chronic iron deficiency anemia, also has severe B12 deficiency anemia, found to have elevated intrinsic factor and antiparietal cell antibodies based on the labs from January 2023.  Patient reports that she has been anemic as a child and worsened during her pregnancies.  She has a 5-month-old infant at home.  She received iron infusions and B12 replacement.  Currently, her iron and B12 deficiency anemia have resolved.  Most recent labs revealed hemoglobin 12.7, normal MCV, normal platelets, ferritin 48, B12 549.  Patient also had history of menorrhagia.  Patient also reports irregular bowel habits, abdominal bloating and sometimes rectal bleeding on wiping.  She does report to severe rectal pain during defecation  Follow-up visit 02/26/2022 Patient is here for her hemorrhoid banding.  She reports that over the weekend, she had severe constipation for which she took MiraLAX and finally had a bowel movement that resulted in rectal bleeding.  Her bowel movements are currently formed.  She has been applying nitroglycerin with lidocaine per rectum twice daily which she thinks is helping with rectal pain.  She underwent upper endoscopy and colonoscopy which were unremarkable.  Follow-up visit 10/21/2022 Patient is here for recurrence of hemorrhoidal symptoms and intermittent rectal bleeding.  She used topical nitroglycerin for anal fissure which resolved  her pain.  She does have episodes of severe constipation about once a week associated with significant straining, states that she spends about 30 minutes on the toilet.  She has incorporated more fiber in her diet, taking MiraLAX as needed because it led to urgency.  She is also finding her college to be stressful and her symptoms flared up when she went back to college after winter break.  Currently, does not have any hemorrhoidal symptoms or rectal bleeding.  She underwent hemorrhoid ligation x 1 only.  She is taking oral iron for pernicious anemia, anemia has resolved but has severe iron deficiency.  We also reports heartburn and reflux that has started recently since she went back to college.  She is taking Tums and Pepcid as needed.  She drinks coffee normally and has heartburn which is new for her  NSAIDs: None  Antiplts/Anticoagulants/Anti thrombotics: None  GI Procedures:  EGD and colonoscopy 02/14/2022 - Normal gastroesophageal junction and esophagus. - Esophagogastric landmarks identified. - Normal stomach. Biopsied. - Normal duodenal bulb and examined duodenum.  - The examined portion of the ileum was normal. - One 4 mm polyp at the recto-sigmoid colon, removed with a cold snare. Resected and retrieved. - Non-bleeding external hemorrhoids.  - The examined portion of the ileum was normal. - One 4 mm polyp at the recto-sigmoid colon, removed with a cold snare. Resected and retrieved. - Non-bleeding external hemorrhoids.  She denies family history of GI malignancy  Past Medical History:  Diagnosis Date   Anemia    Asthma    exercise induced   Cesarean delivery delivered 03/02/2016   Depression 2014   GERD (gastroesophageal reflux  disease)    Headache    h/o migraines   PTSD (post-traumatic stress disorder) 2014   Thrombocytopenia ([facility])     Past Surgical History:  Procedure Laterality Date   CESAREAN SECTION N/A 03/01/2016   Procedure: CESAREAN SECTION;  Surgeon: Conard Novak, MD;  Location: [facility] ORS;  Service: Obstetrics;  Laterality: N/A;   CESAREAN SECTION N/A 02/11/2020   Procedure: CESAREAN SECTION;  Surgeon: Conard Novak, MD;  Location: [facility] ORS;  Service: Obstetrics;  Laterality: N/A;  RNFA   CESAREAN SECTION WITH BILATERAL TUBAL LIGATION N/A 09/14/2021   Procedure: CESAREAN SECTION WITH BILATERAL TUBAL LIGATION;  Surgeon: Conard Novak, MD;  Location: [facility] ORS;  Service: Obstetrics;  Laterality: N/A;   COLONOSCOPY WITH PROPOFOL N/A 02/14/2022   Procedure: COLONOSCOPY WITH PROPOFOL;  Surgeon: Toney Reil, MD;  Location: [facility] ENDOSCOPY;  Service: Gastroenterology;  Laterality: N/A;   ESOPHAGOGASTRODUODENOSCOPY (EGD) WITH PROPOFOL N/A 02/14/2022   Procedure: ESOPHAGOGASTRODUODENOSCOPY (EGD) WITH PROPOFOL;  Surgeon: Toney Reil, MD;  Location: [facility] ENDOSCOPY;  Service: Gastroenterology;  Laterality: N/A;   Current Outpatient Medications:    calcium carbonate (OS-CAL) 1250 (500 Ca) MG chewable tablet, Chew by mouth., Disp: , Rfl:    Cholecalciferol (D 1000) 25 MCG (1000 UT) capsule, Take by mouth., Disp: , Rfl:    Cobalamin Combinations (B-12) 6627000337 MCG SUBL, , Disp: , Rfl:    cyclobenzaprine (FLEXERIL) 5 MG tablet, , Disp: , Rfl:    ferrous sulfate (FER-IN-SOL) 75 (15 Fe) MG/ML SOLN, Take 1 tablet by mouth daily., Disp: , Rfl:    Ferrous Sulfate (IRON PO), Take 1 tablet by mouth daily., Disp: , Rfl:    fluticasone (FLONASE) 50 MCG/ACT nasal spray, SHAKE LIQUID AND USE 2 SPRAYS IN EACH NOSTRIL DAILY, Disp: , Rfl:    gabapentin (NEURONTIN) 300 MG capsule, Take 300 mg by mouth daily., Disp: , Rfl:    VIENVA 0.1-20 MG-MCG tablet, Take 1 tablet by mouth daily., Disp: , Rfl:     Family History  Problem Relation Age of Onset   Healthy Mother    Healthy Father    Cancer - Colon Maternal Grandfather    Breast cancer Paternal Grandmother        77s   Breast cancer Maternal Aunt        not sure of age     Social History    Tobacco Use   Smoking status: Never   Smokeless tobacco: Never  Vaping Use   Vaping Use: Never used  Substance Use Topics   Alcohol use: No   Drug use: No    Allergies as of 10/21/2022   (No Known Allergies)    Review of Systems:    All systems reviewed and negative except where noted in HPI.   Physical Exam:  BP 101/68 (BP Location: Left Arm, Patient Position: Sitting, Cuff Size: Normal)   Pulse 67   Temp 98.2 F (36.8 C) (Oral)   Ht 5' (1.524 m)   Wt 154 lb (69.9 kg)   BMI 30.08 kg/m  No LMP recorded.  General:   Alert,  Well-developed, well-nourished, pleasant and cooperative in NAD Head:  Normocephalic and atraumatic. Eyes:  Sclera clear, no icterus.   Conjunctiva pink. Ears:  Normal auditory acuity. Nose:  No deformity, discharge, or lesions. Mouth:  No deformity or lesions,oropharynx pink & moist. Neck:  Supple; no masses or thyromegaly. Lungs:  Respirations even and unlabored.  Clear throughout to auscultation.   No wheezes, crackles, or  rhonchi. No acute distress. Heart:  Regular rate and rhythm; no murmurs, clicks, rubs, or gallops. Abdomen:  Normal bowel sounds. Soft, non-tender and non-distended without masses, hepatosplenomegaly or hernias noted.  No guarding or rebound tenderness.   Rectal: Not performed Msk:  Symmetrical without gross deformities. Good, equal movement & strength bilaterally. Pulses:  Normal pulses noted. Extremities:  No clubbing or edema.  No cyanosis. Neurologic:  Alert and oriented x3;  grossly normal neurologically. Skin:  Intact without significant lesions or rashes. No jaundice. Psych:  Alert and cooperative. Normal mood and affect.  Imaging Studies: No abdominal imaging  Assessment and Plan:   Nicole Hartman is a 27 y.o. pleasant female with history of chronic constipation with abdominal bloating, chronic iron and B12 deficiency anemia secondary to pernicious anemia, symptomatic hemorrhoids with rectal bleeding and  rectal pain, heartburn  Heartburn Triggered by stress and food choices Trial of over-the-counter Prilosec or Nexium 20 mg twice daily before meals for 2 weeks Discussed about antireflux lifestyle, advised to cut back on coffee  Pernicious anemia Currently, anemia has resolved with normal B12 levels EGD with biopsies were unremarkable Has iron deficiency, taking oral iron Advised her to follow-up with hematology for IV iron as needed  Chronic constipation Reiterated on high-fiber diet, information provided Continue MiraLAX daily Also, advised to start taking fiber supplement such as Benefiber or fiber choice  Chronic posterior anal fissure:  Responded to 0.125% nitroglycerin with 5% lidocaine, instructions provided Refill provided today  Rectal bleeding secondary to grade 2 hemorrhoids Colonoscopy was unremarkable except for hemorrhoids S/p hemorrhoid ligation of RP  Patient deferred hemorrhoid ligation today   Follow up as needed  Cephas Darby, MD

## 2022-10-21 NOTE — Patient Instructions (Signed)
[facility] [address], [place] [address].   Phone number [phone].   Please allow at least 24 hours before picking up the compounded cream because the pharmacy has to make the medication.  

## 2022-10-24 DIAGNOSIS — H5213 Myopia, bilateral: Secondary | ICD-10-CM | POA: Diagnosis not present

## 2022-10-30 DIAGNOSIS — M79604 Pain in right leg: Secondary | ICD-10-CM | POA: Diagnosis not present

## 2022-10-30 DIAGNOSIS — F419 Anxiety disorder, unspecified: Secondary | ICD-10-CM | POA: Diagnosis not present

## 2022-10-30 DIAGNOSIS — E538 Deficiency of other specified B group vitamins: Secondary | ICD-10-CM | POA: Diagnosis not present

## 2022-10-30 DIAGNOSIS — R4589 Other symptoms and signs involving emotional state: Secondary | ICD-10-CM | POA: Diagnosis not present

## 2022-10-30 DIAGNOSIS — R3915 Urgency of urination: Secondary | ICD-10-CM | POA: Diagnosis not present

## 2022-10-30 DIAGNOSIS — R200 Anesthesia of skin: Secondary | ICD-10-CM | POA: Diagnosis not present

## 2022-10-30 DIAGNOSIS — N939 Abnormal uterine and vaginal bleeding, unspecified: Secondary | ICD-10-CM | POA: Diagnosis not present

## 2022-12-02 DIAGNOSIS — N898 Other specified noninflammatory disorders of vagina: Secondary | ICD-10-CM | POA: Diagnosis not present

## 2022-12-02 DIAGNOSIS — J45909 Unspecified asthma, uncomplicated: Secondary | ICD-10-CM | POA: Diagnosis not present

## 2022-12-02 DIAGNOSIS — R1013 Epigastric pain: Secondary | ICD-10-CM | POA: Diagnosis not present

## 2022-12-06 DIAGNOSIS — E559 Vitamin D deficiency, unspecified: Secondary | ICD-10-CM | POA: Diagnosis not present

## 2022-12-06 DIAGNOSIS — R202 Paresthesia of skin: Secondary | ICD-10-CM | POA: Diagnosis not present

## 2022-12-06 DIAGNOSIS — R200 Anesthesia of skin: Secondary | ICD-10-CM | POA: Diagnosis not present

## 2022-12-06 DIAGNOSIS — E538 Deficiency of other specified B group vitamins: Secondary | ICD-10-CM | POA: Diagnosis not present

## 2022-12-06 DIAGNOSIS — D510 Vitamin B12 deficiency anemia due to intrinsic factor deficiency: Secondary | ICD-10-CM | POA: Diagnosis not present

## 2023-01-13 DIAGNOSIS — N898 Other specified noninflammatory disorders of vagina: Secondary | ICD-10-CM | POA: Diagnosis not present

## 2023-01-13 DIAGNOSIS — R3915 Urgency of urination: Secondary | ICD-10-CM | POA: Diagnosis not present

## 2023-02-05 ENCOUNTER — Inpatient Hospital Stay: Payer: Medicaid Other | Attending: Oncology

## 2023-02-05 ENCOUNTER — Other Ambulatory Visit: Payer: Self-pay

## 2023-02-05 DIAGNOSIS — N920 Excessive and frequent menstruation with regular cycle: Secondary | ICD-10-CM | POA: Insufficient documentation

## 2023-02-05 DIAGNOSIS — E538 Deficiency of other specified B group vitamins: Secondary | ICD-10-CM | POA: Diagnosis not present

## 2023-02-05 DIAGNOSIS — Z803 Family history of malignant neoplasm of breast: Secondary | ICD-10-CM | POA: Insufficient documentation

## 2023-02-05 DIAGNOSIS — Z800 Family history of malignant neoplasm of digestive organs: Secondary | ICD-10-CM | POA: Diagnosis not present

## 2023-02-05 DIAGNOSIS — D509 Iron deficiency anemia, unspecified: Secondary | ICD-10-CM | POA: Diagnosis not present

## 2023-02-05 DIAGNOSIS — D508 Other iron deficiency anemias: Secondary | ICD-10-CM

## 2023-02-05 DIAGNOSIS — Z79899 Other long term (current) drug therapy: Secondary | ICD-10-CM | POA: Diagnosis not present

## 2023-02-05 LAB — IRON AND TIBC
Iron: 114 ug/dL (ref 28–170)
Saturation Ratios: 25 % (ref 10.4–31.8)
TIBC: 456 ug/dL — ABNORMAL HIGH (ref 250–450)
UIBC: 342 ug/dL

## 2023-02-05 LAB — CBC WITH DIFFERENTIAL/PLATELET
Abs Immature Granulocytes: 0 10*3/uL (ref 0.00–0.07)
Basophils Absolute: 0 10*3/uL (ref 0.0–0.1)
Basophils Relative: 1 %
Eosinophils Absolute: 0.1 10*3/uL (ref 0.0–0.5)
Eosinophils Relative: 2 %
HCT: 36.9 % (ref 36.0–46.0)
Hemoglobin: 12 g/dL (ref 12.0–15.0)
Immature Granulocytes: 0 %
Lymphocytes Relative: 44 %
Lymphs Abs: 1.7 10*3/uL (ref 0.7–4.0)
MCH: 28.4 pg (ref 26.0–34.0)
MCHC: 32.5 g/dL (ref 30.0–36.0)
MCV: 87.2 fL (ref 80.0–100.0)
Monocytes Absolute: 0.3 10*3/uL (ref 0.1–1.0)
Monocytes Relative: 8 %
Neutro Abs: 1.8 10*3/uL (ref 1.7–7.7)
Neutrophils Relative %: 45 %
Platelets: 119 10*3/uL — ABNORMAL LOW (ref 150–400)
RBC: 4.23 MIL/uL (ref 3.87–5.11)
RDW: 12.5 % (ref 11.5–15.5)
WBC: 3.8 10*3/uL — ABNORMAL LOW (ref 4.0–10.5)
nRBC: 0 % (ref 0.0–0.2)

## 2023-02-05 LAB — VITAMIN B12: Vitamin B-12: 284 pg/mL (ref 180–914)

## 2023-02-05 LAB — FERRITIN: Ferritin: 12 ng/mL (ref 11–307)

## 2023-02-10 ENCOUNTER — Other Ambulatory Visit: Payer: Self-pay

## 2023-02-10 ENCOUNTER — Inpatient Hospital Stay: Payer: Medicaid Other

## 2023-02-10 ENCOUNTER — Encounter: Payer: Self-pay | Admitting: Oncology

## 2023-02-10 ENCOUNTER — Inpatient Hospital Stay (HOSPITAL_BASED_OUTPATIENT_CLINIC_OR_DEPARTMENT_OTHER): Payer: Medicaid Other | Admitting: Oncology

## 2023-02-10 VITALS — BP 115/63 | HR 64 | Temp 98.0°F | Resp 18

## 2023-02-10 VITALS — BP 111/77 | HR 64 | Temp 97.7°F | Resp 18 | Wt 146.5 lb

## 2023-02-10 DIAGNOSIS — D500 Iron deficiency anemia secondary to blood loss (chronic): Secondary | ICD-10-CM | POA: Diagnosis not present

## 2023-02-10 DIAGNOSIS — E538 Deficiency of other specified B group vitamins: Secondary | ICD-10-CM

## 2023-02-10 DIAGNOSIS — D510 Vitamin B12 deficiency anemia due to intrinsic factor deficiency: Secondary | ICD-10-CM

## 2023-02-10 DIAGNOSIS — N920 Excessive and frequent menstruation with regular cycle: Secondary | ICD-10-CM

## 2023-02-10 DIAGNOSIS — D509 Iron deficiency anemia, unspecified: Secondary | ICD-10-CM | POA: Diagnosis not present

## 2023-02-10 DIAGNOSIS — Z79899 Other long term (current) drug therapy: Secondary | ICD-10-CM | POA: Diagnosis not present

## 2023-02-10 DIAGNOSIS — Z800 Family history of malignant neoplasm of digestive organs: Secondary | ICD-10-CM | POA: Diagnosis not present

## 2023-02-10 DIAGNOSIS — Z803 Family history of malignant neoplasm of breast: Secondary | ICD-10-CM | POA: Diagnosis not present

## 2023-02-10 MED ORDER — SODIUM CHLORIDE 0.9 % IV SOLN
Freq: Once | INTRAVENOUS | Status: AC
  Filled 2023-02-10: qty 250

## 2023-02-10 MED ORDER — CYANOCOBALAMIN 1000 MCG/ML IJ SOLN
1000.0000 ug | Freq: Once | INTRAMUSCULAR | Status: AC
  Administered 2023-02-10: 1000 ug via INTRAMUSCULAR
  Filled 2023-02-10: qty 1

## 2023-02-10 MED ORDER — SODIUM CHLORIDE 0.9 % IV SOLN
200.0000 mg | Freq: Once | INTRAVENOUS | Status: AC
  Administered 2023-02-10: 200 mg via INTRAVENOUS
  Filled 2023-02-10: qty 200

## 2023-02-10 NOTE — Assessment & Plan Note (Signed)
Vitamin B12 deficiency/pernicious anemia [Parietal Cell Antibody-IgG + intrinsic antibody +] B12 level has decreased. She is on sublingual B12 Recommend B12 monthly injeciton.  Once her B12 level is improved, may consider resuming on sublingual B12 at higher dose.

## 2023-02-10 NOTE — Assessment & Plan Note (Signed)
Iron deficiency anemia  Labs are reviewed and discussed with patient. Lab Results  Component Value Date   IRON 114 02/05/2023   TIBC 456 (H) 02/05/2023   IRONPCTSAT 25 02/05/2023   FERRITIN 12 02/05/2023     Recommend IV Venofer x 1 for maintenance given that she continues to have heavy menstrual period. Marland Kitchen

## 2023-02-10 NOTE — Assessment & Plan Note (Signed)
Follow up with Gyn.

## 2023-02-10 NOTE — Progress Notes (Signed)
Hematology/Oncology Progress note [phone] Fax:([phone]      CHIEF COMPLAINTS/REASON FOR VISIT:  Follow up for iron deficiency anemia and vitamin B12 deficiency  ASSESSMENT & PLAN:   IDA (iron deficiency anemia) Iron deficiency anemia  Labs are reviewed and discussed with patient. Lab Results  Component Value Date   IRON 114 02/05/2023   TIBC 456 (H) 02/05/2023   IRONPCTSAT 25 02/05/2023   FERRITIN 12 02/05/2023     Recommend IV Venofer x 1 for maintenance given that she continues to have heavy menstrual period. .    Menorrhagia Follow up with Gyn  Pernicious anemia Vitamin B12 deficiency/pernicious anemia [Parietal Cell Antibody-IgG + intrinsic antibody +] B12 level has decreased. She is on sublingual B12 Recommend B12 monthly injeciton.  Once her B12 level is improved, may consider resuming on sublingual B12 at higher dose.    Orders Placed This Encounter  Procedures   Von Willebrand panel    Standing Status:   Future    Number of Occurrences:   1    Standing Expiration Date:   02/10/2024   CBC with Differential (Cancer Center Only)    Standing Status:   Future    Standing Expiration Date:   02/10/2024   Iron and TIBC    Standing Status:   Future    Standing Expiration Date:   02/10/2024   Ferritin    Standing Status:   Future    Standing Expiration Date:   02/10/2024   Vitamin B12    Standing Status:   Future    Standing Expiration Date:   02/10/2024   Follow up in 6 months. All questions were answered. The patient knows to call the clinic with any problems, questions or concerns.  Rickard Patience, MD, PhD [facility] Hematology Oncology 02/10/2023   INTERVAL HISTORY Nicole Hartman is a 27 y.o. female who has above history reviewed by me today presents for follow up visit for management of iron deficiency anemia and vitamin B12 deficiency Her right lower extremity weakness has improved.  She has received B12 injections  in the past. Currently she take sublingual Vitamin B12.  + heavy menstrual period.    Review of Systems  Constitutional:  Negative for appetite change, chills, fatigue and fever.  HENT:   Negative for hearing loss and voice change.   Eyes:  Negative for eye problems.  Respiratory:  Negative for chest tightness and cough.   Cardiovascular:  Negative for chest pain.  Gastrointestinal:  Negative for abdominal distention, abdominal pain and blood in stool.  Endocrine: Negative for hot flashes.  Genitourinary:  Positive for menstrual problem. Negative for difficulty urinating and frequency.   Musculoskeletal:  Negative for arthralgias.  Skin:  Negative for itching and rash.  Neurological:  Negative for extremity weakness.  Hematological:  Negative for adenopathy.  Psychiatric/Behavioral:  Negative for confusion.     MEDICAL HISTORY:  Past Medical History:  Diagnosis Date   Anemia    Asthma    exercise induced   Cesarean delivery delivered 03/02/2016   Depression 2014   GERD (gastroesophageal reflux disease)    Headache    h/o migraines   PTSD (post-traumatic stress disorder) 2014   Thrombocytopenia (HCC)     SURGICAL HISTORY: Past Surgical History:  Procedure Laterality Date   CESAREAN SECTION N/A 03/01/2016   Procedure: CESAREAN SECTION;  Surgeon: Conard Novak, MD;  Location: [facility] ORS;  Service: Obstetrics;  Laterality: N/A;   CESAREAN SECTION N/A 02/11/2020  Procedure: CESAREAN SECTION;  Surgeon: Conard Novak, MD;  Location: [facility] ORS;  Service: Obstetrics;  Laterality: N/A;  RNFA   CESAREAN SECTION WITH BILATERAL TUBAL LIGATION N/A 09/14/2021   Procedure: CESAREAN SECTION WITH BILATERAL TUBAL LIGATION;  Surgeon: Conard Novak, MD;  Location: [facility] ORS;  Service: Obstetrics;  Laterality: N/A;   COLONOSCOPY WITH PROPOFOL N/A 02/14/2022   Procedure: COLONOSCOPY WITH PROPOFOL;  Surgeon: Toney Reil, MD;  Location: [facility] ENDOSCOPY;  Service: Gastroenterology;   Laterality: N/A;   ESOPHAGOGASTRODUODENOSCOPY (EGD) WITH PROPOFOL N/A 02/14/2022   Procedure: ESOPHAGOGASTRODUODENOSCOPY (EGD) WITH PROPOFOL;  Surgeon: Toney Reil, MD;  Location: [facility] ENDOSCOPY;  Service: Gastroenterology;  Laterality: N/A;    SOCIAL HISTORY: Social History   Socioeconomic History   Marital status: Married    Spouse name: Rolm Gala   Number of children: 3   Years of education: college   Highest education level: Not on file  Occupational History   Occupation: Sales executive  Tobacco Use   Smoking status: Never   Smokeless tobacco: Never  Vaping Use   Vaping Use: Never used  Substance and Sexual Activity   Alcohol use: No   Drug use: No   Sexual activity: Yes    Partners: Male    Birth control/protection: Surgical  Other Topics Concern   Not on file  Social History Narrative   Lives at home with husband and three children.   Right-handed.   One cup caffeine per day.   Social Determinants of Health   Financial Resource Strain: Not on file  Food Insecurity: Not on file  Transportation Needs: Not on file  Physical Activity: Not on file  Stress: Not on file  Social Connections: Not on file  Intimate Partner Violence: Not on file    FAMILY HISTORY: Family History  Problem Relation Age of Onset   Healthy Mother    Healthy Father    Cancer - Colon Maternal Grandfather    Breast cancer Paternal Grandmother        22s   Breast cancer Maternal Aunt        not sure of age    ALLERGIES:  has No Known Allergies.  MEDICATIONS:  Current Outpatient Medications  Medication Sig Dispense Refill   Cobalamin Combinations (B-12) [phone] MCG SUBL      cyclobenzaprine (FLEXERIL) 5 MG tablet      fluticasone (FLONASE) 50 MCG/ACT nasal spray SHAKE LIQUID AND USE 2 SPRAYS IN EACH NOSTRIL DAILY     gabapentin (NEURONTIN) 300 MG capsule Take 300 mg by mouth daily.     pantoprazole (PROTONIX) 20 MG tablet Take 1 tablet by mouth daily.     ferrous sulfate  (FER-IN-SOL) 75 (15 Fe) MG/ML SOLN Take 1 tablet by mouth daily.     No current facility-administered medications for this visit.     PHYSICAL EXAMINATION:  Vitals:   02/10/23 1317  BP: 111/77  Pulse: 64  Resp: 18  Temp: 97.7 F (36.5 C)   Filed Weights   02/10/23 1317  Weight: 146 lb 8 oz (66.5 kg)    Physical Exam Constitutional:      General: She is not in acute distress.    Comments: Patient ambulates independently  HENT:     Head: Normocephalic and atraumatic.  Eyes:     General: No scleral icterus. Cardiovascular:     Rate and Rhythm: Normal rate.  Pulmonary:     Effort: Pulmonary effort is normal. No respiratory distress.  Breath sounds: No wheezing.  Abdominal:     General: There is no distension.     Palpations: Abdomen is soft.  Musculoskeletal:        General: No deformity. Normal range of motion.     Cervical back: Normal range of motion and neck supple.  Skin:    General: Skin is warm and dry.     Findings: No erythema or rash.  Neurological:     Mental Status: She is alert and oriented to person, place, and time. Mental status is at baseline.     Cranial Nerves: No cranial nerve deficit.  Psychiatric:        Mood and Affect: Mood normal.      LABORATORY DATA:  I have reviewed the data as listed Lab Results  Component Value Date   WBC 3.8 (L) 02/05/2023   HGB 12.0 02/05/2023   HCT 36.9 02/05/2023   MCV 87.2 02/05/2023   PLT 119 (L) 02/05/2023   No results for input(s): "NA", "K", "CL", "CO2", "GLUCOSE", "BUN", "CREATININE", "CALCIUM", "GFRNONAA", "GFRAA", "PROT", "ALBUMIN", "AST", "ALT", "ALKPHOS", "BILITOT", "BILIDIR", "IBILI" in the last 8760 hours. Iron/TIBC/Ferritin/ %Sat    Component Value Date/Time   IRON 114 02/05/2023 0907   TIBC 456 (H) 02/05/2023 0907   FERRITIN 12 02/05/2023 0907   IRONPCTSAT 25 02/05/2023 0907      RADIOGRAPHIC STUDIES: I have personally reviewed the radiological images as listed and agreed with the  findings in the report.  No results found.

## 2023-02-10 NOTE — Progress Notes (Signed)
Pt here or follow up. Pt reports easy bruising and low energy levels

## 2023-02-11 LAB — VON WILLEBRAND PANEL
Coagulation Factor VIII: 72 % (ref 56–140)
Ristocetin Co-factor, Plasma: 29 % — ABNORMAL LOW (ref 50–200)
Von Willebrand Antigen, Plasma: 46 % — ABNORMAL LOW (ref 50–200)

## 2023-02-11 LAB — COAG STUDIES INTERP REPORT

## 2023-02-21 ENCOUNTER — Other Ambulatory Visit: Payer: Self-pay | Admitting: Oncology

## 2023-02-21 DIAGNOSIS — N920 Excessive and frequent menstruation with regular cycle: Secondary | ICD-10-CM

## 2023-02-24 ENCOUNTER — Telehealth: Payer: Self-pay

## 2023-02-24 NOTE — Telephone Encounter (Signed)
-----   Message from Rickard Patience, MD sent at 02/21/2023  4:40 PM EDT ----- Mychart message sent. Please arrange lab encounter. Thanks.

## 2023-02-24 NOTE — Telephone Encounter (Signed)
Please contact pt to set up lab appt. Dr. Cathie Hoops has informed pt of the need for lab.

## 2023-02-27 ENCOUNTER — Inpatient Hospital Stay: Payer: Medicaid Other

## 2023-02-28 ENCOUNTER — Inpatient Hospital Stay: Payer: Medicaid Other | Attending: Oncology

## 2023-02-28 ENCOUNTER — Inpatient Hospital Stay: Payer: Medicaid Other

## 2023-02-28 DIAGNOSIS — E538 Deficiency of other specified B group vitamins: Secondary | ICD-10-CM | POA: Insufficient documentation

## 2023-02-28 DIAGNOSIS — D509 Iron deficiency anemia, unspecified: Secondary | ICD-10-CM | POA: Insufficient documentation

## 2023-02-28 DIAGNOSIS — N920 Excessive and frequent menstruation with regular cycle: Secondary | ICD-10-CM

## 2023-03-04 LAB — VON WILLEBRAND PANEL
Coagulation Factor VIII: 68 % (ref 56–140)
Ristocetin Co-factor, Plasma: 27 % — ABNORMAL LOW (ref 50–200)
Von Willebrand Antigen, Plasma: 49 % — ABNORMAL LOW (ref 50–200)

## 2023-03-04 LAB — COAG STUDIES INTERP REPORT

## 2023-03-07 LAB — VON WILLEBRAND FACTOR MULTIMER

## 2023-03-12 ENCOUNTER — Inpatient Hospital Stay: Payer: Medicaid Other

## 2023-03-14 ENCOUNTER — Inpatient Hospital Stay: Payer: Medicaid Other

## 2023-03-14 DIAGNOSIS — E538 Deficiency of other specified B group vitamins: Secondary | ICD-10-CM

## 2023-03-14 DIAGNOSIS — D509 Iron deficiency anemia, unspecified: Secondary | ICD-10-CM | POA: Diagnosis not present

## 2023-03-14 MED ORDER — CYANOCOBALAMIN 1000 MCG/ML IJ SOLN
1000.0000 ug | Freq: Once | INTRAMUSCULAR | Status: AC
  Administered 2023-03-14: 1000 ug via INTRAMUSCULAR
  Filled 2023-03-14: qty 1

## 2023-04-11 ENCOUNTER — Inpatient Hospital Stay: Payer: Medicaid Other

## 2023-04-14 ENCOUNTER — Inpatient Hospital Stay: Payer: Medicaid Other | Attending: Oncology

## 2023-04-14 DIAGNOSIS — D510 Vitamin B12 deficiency anemia due to intrinsic factor deficiency: Secondary | ICD-10-CM | POA: Diagnosis not present

## 2023-04-14 DIAGNOSIS — E538 Deficiency of other specified B group vitamins: Secondary | ICD-10-CM

## 2023-04-14 MED ORDER — CYANOCOBALAMIN 1000 MCG/ML IJ SOLN
1000.0000 ug | Freq: Once | INTRAMUSCULAR | Status: AC
  Administered 2023-04-14: 1000 ug via INTRAMUSCULAR
  Filled 2023-04-14: qty 1

## 2023-05-09 ENCOUNTER — Inpatient Hospital Stay: Payer: Medicaid Other

## 2023-05-12 ENCOUNTER — Inpatient Hospital Stay: Payer: Medicaid Other

## 2023-06-04 ENCOUNTER — Other Ambulatory Visit: Payer: Self-pay | Admitting: Oncology

## 2023-06-04 ENCOUNTER — Other Ambulatory Visit: Payer: Medicaid Other

## 2023-06-04 ENCOUNTER — Telehealth: Payer: Self-pay

## 2023-06-04 DIAGNOSIS — D6804 Acquired von Willebrand disease: Secondary | ICD-10-CM

## 2023-06-04 NOTE — Telephone Encounter (Signed)
Spoke to pt and informed her of MD recommendation.   Please schedule lab tomorrow (8/22) at 1pm.   She accepted MD appt for 9/5 @10 :45, but I just realized Dr. Cathie Hoops wants her OCT appt moved up so she will need MD/ Venofer. Please contact to r/s MD/ venofer to be approx 2 weeks from 8/22. Please call pt before setting up appt due to busy scheduled.   Appts in Oct can be cancelled.

## 2023-06-04 NOTE — Telephone Encounter (Signed)
-----   Message from Rickard Patience sent at 06/04/2023  9:04 AM EDT ----- Please let patient know that her blood work indicates that she may have a condition predispose her to heavy menstrual bleeding.  I recommend her to do additional testing and see me 2 weeks after blood work.  We can move up her appt from Oct to now. Thanks.

## 2023-06-05 ENCOUNTER — Inpatient Hospital Stay: Payer: Medicaid Other | Attending: Oncology

## 2023-06-05 DIAGNOSIS — E538 Deficiency of other specified B group vitamins: Secondary | ICD-10-CM

## 2023-06-05 DIAGNOSIS — Z79899 Other long term (current) drug therapy: Secondary | ICD-10-CM | POA: Insufficient documentation

## 2023-06-05 DIAGNOSIS — D510 Vitamin B12 deficiency anemia due to intrinsic factor deficiency: Secondary | ICD-10-CM | POA: Insufficient documentation

## 2023-06-05 DIAGNOSIS — D6804 Acquired von Willebrand disease: Secondary | ICD-10-CM

## 2023-06-05 LAB — VITAMIN B12: Vitamin B-12: 317 pg/mL (ref 180–914)

## 2023-06-05 LAB — CBC WITH DIFFERENTIAL (CANCER CENTER ONLY)
Abs Immature Granulocytes: 0.01 10*3/uL (ref 0.00–0.07)
Basophils Absolute: 0 10*3/uL (ref 0.0–0.1)
Basophils Relative: 1 %
Eosinophils Absolute: 0.1 10*3/uL (ref 0.0–0.5)
Eosinophils Relative: 2 %
HCT: 39.7 % (ref 36.0–46.0)
Hemoglobin: 12.9 g/dL (ref 12.0–15.0)
Immature Granulocytes: 0 %
Lymphocytes Relative: 41 %
Lymphs Abs: 2.5 10*3/uL (ref 0.7–4.0)
MCH: 28.3 pg (ref 26.0–34.0)
MCHC: 32.5 g/dL (ref 30.0–36.0)
MCV: 87.1 fL (ref 80.0–100.0)
Monocytes Absolute: 0.4 10*3/uL (ref 0.1–1.0)
Monocytes Relative: 6 %
Neutro Abs: 3 10*3/uL (ref 1.7–7.7)
Neutrophils Relative %: 50 %
Platelet Count: 188 10*3/uL (ref 150–400)
RBC: 4.56 MIL/uL (ref 3.87–5.11)
RDW: 12.1 % (ref 11.5–15.5)
WBC Count: 6 10*3/uL (ref 4.0–10.5)
nRBC: 0 % (ref 0.0–0.2)

## 2023-06-05 LAB — IRON AND TIBC
Iron: 83 ug/dL (ref 28–170)
Saturation Ratios: 15 % (ref 10.4–31.8)
TIBC: 538 ug/dL — ABNORMAL HIGH (ref 250–450)
UIBC: 455 ug/dL

## 2023-06-05 LAB — FERRITIN: Ferritin: 6 ng/mL — ABNORMAL LOW (ref 11–307)

## 2023-06-12 ENCOUNTER — Inpatient Hospital Stay: Payer: Medicaid Other

## 2023-06-13 ENCOUNTER — Inpatient Hospital Stay: Payer: Medicaid Other

## 2023-06-13 DIAGNOSIS — D510 Vitamin B12 deficiency anemia due to intrinsic factor deficiency: Secondary | ICD-10-CM | POA: Diagnosis not present

## 2023-06-13 DIAGNOSIS — E538 Deficiency of other specified B group vitamins: Secondary | ICD-10-CM

## 2023-06-13 LAB — VON WILLEBRAND FACTOR MULTIMER

## 2023-06-13 MED ORDER — CYANOCOBALAMIN 1000 MCG/ML IJ SOLN
1000.0000 ug | Freq: Once | INTRAMUSCULAR | Status: AC
  Administered 2023-06-13: 1000 ug via INTRAMUSCULAR
  Filled 2023-06-13: qty 1

## 2023-06-20 ENCOUNTER — Inpatient Hospital Stay: Payer: Medicaid Other

## 2023-06-20 ENCOUNTER — Inpatient Hospital Stay: Payer: Medicaid Other | Attending: Oncology | Admitting: Oncology

## 2023-06-20 ENCOUNTER — Encounter: Payer: Self-pay | Admitting: Oncology

## 2023-06-20 VITALS — BP 102/62 | HR 63 | Temp 96.3°F | Resp 18

## 2023-06-20 VITALS — BP 112/69 | HR 66 | Temp 97.9°F | Resp 18 | Wt 142.5 lb

## 2023-06-20 DIAGNOSIS — N921 Excessive and frequent menstruation with irregular cycle: Secondary | ICD-10-CM | POA: Diagnosis not present

## 2023-06-20 DIAGNOSIS — E538 Deficiency of other specified B group vitamins: Secondary | ICD-10-CM

## 2023-06-20 DIAGNOSIS — D500 Iron deficiency anemia secondary to blood loss (chronic): Secondary | ICD-10-CM

## 2023-06-20 DIAGNOSIS — D6800 Von Willebrand disease, unspecified: Secondary | ICD-10-CM | POA: Diagnosis not present

## 2023-06-20 DIAGNOSIS — D509 Iron deficiency anemia, unspecified: Secondary | ICD-10-CM | POA: Diagnosis present

## 2023-06-20 MED ORDER — SODIUM CHLORIDE 0.9 % IV SOLN
200.0000 mg | Freq: Once | INTRAVENOUS | Status: AC
  Administered 2023-06-20: 200 mg via INTRAVENOUS
  Filled 2023-06-20: qty 200

## 2023-06-20 MED ORDER — SODIUM CHLORIDE 0.9 % IV SOLN
INTRAVENOUS | Status: DC
  Filled 2023-06-20: qty 250

## 2023-06-20 MED ORDER — CYANOCOBALAMIN 1000 MCG/ML IJ SOLN
1000.0000 ug | Freq: Once | INTRAMUSCULAR | Status: AC
  Administered 2023-06-20: 1000 ug via INTRAMUSCULAR
  Filled 2023-06-20: qty 1

## 2023-06-20 NOTE — Assessment & Plan Note (Signed)
I recommend patient to follow up with Gyn for discussion of starting OCP

## 2023-06-20 NOTE — Assessment & Plan Note (Signed)
Iron deficiency anemia  Labs are reviewed and discussed with patient. Lab Results  Component Value Date   IRON 83 06/05/2023   TIBC 538 (H) 06/05/2023   IRONPCTSAT 15 06/05/2023   FERRITIN 6 (L) 06/05/2023     Recommend IV Venofer weekly x 4

## 2023-06-20 NOTE — Assessment & Plan Note (Addendum)
B12 level has improved. Parietal Cell Antibody-IgG + intrinsic antibody +  EGD in May 2023 by Dr. Allegra Lai.  Has been on B12 injection monthly. Most recent injection she developed local bruising.  Discussed about trials of sublingtual B12 daily. She agrees, Rx sent to pharmacy.

## 2023-06-24 ENCOUNTER — Telehealth: Payer: Self-pay | Admitting: *Deleted

## 2023-06-24 NOTE — Telephone Encounter (Signed)
Patient called reporting that she has a bruise at her injection site and it is the size of a half dollar. She states that she felt a knot there the evening of the injection it has now gone away and the bruise is a purple with green edges now. It was a little painful at first but not so much now, juist a bit sore. I advised that she can use warm compresses on area and that I wold call back if anything further is needed. Her last platelet count was normal at. 188

## 2023-06-26 ENCOUNTER — Inpatient Hospital Stay: Payer: Medicaid Other

## 2023-06-27 ENCOUNTER — Encounter: Payer: Self-pay | Admitting: Oncology

## 2023-07-03 ENCOUNTER — Inpatient Hospital Stay: Payer: Medicaid Other

## 2023-07-03 VITALS — BP 110/64 | HR 67 | Temp 97.6°F | Resp 19

## 2023-07-03 DIAGNOSIS — E538 Deficiency of other specified B group vitamins: Secondary | ICD-10-CM

## 2023-07-03 DIAGNOSIS — D509 Iron deficiency anemia, unspecified: Secondary | ICD-10-CM | POA: Diagnosis not present

## 2023-07-03 MED ORDER — SODIUM CHLORIDE 0.9 % IV SOLN
INTRAVENOUS | Status: DC
  Filled 2023-07-03: qty 250

## 2023-07-03 MED ORDER — SODIUM CHLORIDE 0.9 % IV SOLN
200.0000 mg | Freq: Once | INTRAVENOUS | Status: AC
  Administered 2023-07-03: 200 mg via INTRAVENOUS
  Filled 2023-07-03: qty 200

## 2023-07-08 ENCOUNTER — Inpatient Hospital Stay: Payer: Medicaid Other

## 2023-07-08 VITALS — BP 111/69 | HR 76 | Temp 96.4°F | Resp 18

## 2023-07-08 DIAGNOSIS — E538 Deficiency of other specified B group vitamins: Secondary | ICD-10-CM

## 2023-07-08 DIAGNOSIS — D500 Iron deficiency anemia secondary to blood loss (chronic): Secondary | ICD-10-CM

## 2023-07-08 DIAGNOSIS — D509 Iron deficiency anemia, unspecified: Secondary | ICD-10-CM | POA: Diagnosis not present

## 2023-07-08 MED ORDER — SODIUM CHLORIDE 0.9 % IV SOLN
200.0000 mg | Freq: Once | INTRAVENOUS | Status: AC
  Administered 2023-07-08: 200 mg via INTRAVENOUS
  Filled 2023-07-08: qty 200

## 2023-07-08 MED ORDER — SODIUM CHLORIDE 0.9 % IV SOLN
INTRAVENOUS | Status: DC | PRN
  Filled 2023-07-08: qty 250

## 2023-07-10 ENCOUNTER — Inpatient Hospital Stay: Payer: Medicaid Other

## 2023-07-14 ENCOUNTER — Inpatient Hospital Stay: Payer: Medicaid Other

## 2023-07-15 ENCOUNTER — Encounter: Payer: Self-pay | Admitting: Oncology

## 2023-07-15 DIAGNOSIS — D6800 Von Willebrand disease, unspecified: Secondary | ICD-10-CM | POA: Insufficient documentation

## 2023-07-15 NOTE — Progress Notes (Addendum)
Hematology/Oncology Progress note Telephone:(336) [phone] Fax:(336) [phone]      CHIEF COMPLAINTS/REASON FOR VISIT:  Follow up for iron deficiency anemia and vitamin B12 deficiency  ASSESSMENT & PLAN:   IDA (iron deficiency anemia) Iron deficiency anemia  Labs are reviewed and discussed with patient. Lab Results  Component Value Date   IRON 83 06/05/2023   TIBC 538 (H) 06/05/2023   IRONPCTSAT 15 06/05/2023   FERRITIN 6 (L) 06/05/2023     Recommend IV Venofer weekly x 4   Menorrhagia I recommend patient to follow up with Gyn for discussion of starting OCP  B12 deficiency B12 level has improved. Parietal Cell Antibody-IgG + intrinsic antibody +  EGD in May 2023 by Dr. Allegra Lai.  Proceed with B12 injection today. Continue monthly injections.    Von Willebrand disease (HCC) Her lab work up is consistent with von willebrand diease.  VWF activity antigen ratio <0.7, with normal multimer levels, likely Type 32M Recommend DDAVP trial. Consider OCP   No orders of the defined types were placed in this encounter.  Follow up in 6 months. All questions were answered. The patient knows to call the clinic with any problems, questions or concerns.  Rickard Patience, MD, PhD [facility] Hematology Oncology 06/20/2023   INTERVAL HISTORY Nicole Hartman is a 27 y.o. female who has above history reviewed by me today presents for follow up visit for management of iron deficiency anemia and vitamin B12 deficiency Her right lower extremity weakness has improved.  She has received B12 injections in the past. Currently she take sublingual Vitamin B12.  + heavy menstrual period.    Review of Systems  Constitutional:  Negative for appetite change, chills, fatigue and fever.  HENT:   Negative for hearing loss and voice change.   Eyes:  Negative for eye problems.  Respiratory:  Negative for chest tightness and cough.   Cardiovascular:  Negative for chest pain.  Gastrointestinal:   Negative for abdominal distention, abdominal pain and blood in stool.  Endocrine: Negative for hot flashes.  Genitourinary:  Positive for menstrual problem. Negative for difficulty urinating and frequency.   Musculoskeletal:  Negative for arthralgias.  Skin:  Negative for itching and rash.  Neurological:  Negative for extremity weakness.  Hematological:  Negative for adenopathy.  Psychiatric/Behavioral:  Negative for confusion.     MEDICAL HISTORY:  Past Medical History:  Diagnosis Date   Anemia    Asthma    exercise induced   Cesarean delivery delivered 03/02/2016   Depression 2014   GERD (gastroesophageal reflux disease)    Headache    h/o migraines   PTSD (post-traumatic stress disorder) 2014   Thrombocytopenia (HCC)     SURGICAL HISTORY: Past Surgical History:  Procedure Laterality Date   CESAREAN SECTION N/A 03/01/2016   Procedure: CESAREAN SECTION;  Surgeon: Conard Novak, MD;  Location: [facility] ORS;  Service: Obstetrics;  Laterality: N/A;   CESAREAN SECTION N/A 02/11/2020   Procedure: CESAREAN SECTION;  Surgeon: Conard Novak, MD;  Location: [facility] ORS;  Service: Obstetrics;  Laterality: N/A;  RNFA   CESAREAN SECTION WITH BILATERAL TUBAL LIGATION N/A 09/14/2021   Procedure: CESAREAN SECTION WITH BILATERAL TUBAL LIGATION;  Surgeon: Conard Novak, MD;  Location: [facility] ORS;  Service: Obstetrics;  Laterality: N/A;   COLONOSCOPY WITH PROPOFOL N/A 02/14/2022   Procedure: COLONOSCOPY WITH PROPOFOL;  Surgeon: Toney Reil, MD;  Location[facility] ENDOSCOPY;  Service: Gastroenterology;  Laterality: N/A;   ESOPHAGOGASTRODUODENOSCOPY (EGD) WITH PROPOFOL N/A 02/14/2022  Procedure: ESOPHAGOGASTRODUODENOSCOPY (EGD) WITH PROPOFOL;  Surgeon: Toney Reil, MD;  Location: [facility] ENDOSCOPY;  Service: Gastroenterology;  Laterality: N/A;    SOCIAL HISTORY: Social History   Socioeconomic History   Marital status: Married    Spouse name: Rolm Gala   Number of children: 3   Years  of education: college   Highest education level: Not on file  Occupational History   Occupation: Sales executive  Tobacco Use   Smoking status: Never   Smokeless tobacco: Never  Vaping Use   Vaping status: Never Used  Substance and Sexual Activity   Alcohol use: No   Drug use: No   Sexual activity: Yes    Partners: Male    Birth control/protection: Surgical  Other Topics Concern   Not on file  Social History Narrative   Lives at home with husband and three children.   Right-handed.   One cup caffeine per day.   Social Determinants of Health   Financial Resource Strain: Low Risk  (10/30/2022)   Received from [facility]   Overall Financial Resource Strain (CARDIA)    Difficulty of Paying Living Expenses: Not very hard  Food Insecurity: No Food Insecurity (10/30/2022)   Received from  [facility]   Hunger Vital Sign    Worried About Running Out of Food in the Last Year: Never true    Ran Out of Food in the Last Year: Never true  Transportation Needs: No Transportation Needs (10/30/2022)   Received from [facility]   [facility]    Lack of Transportation (Medical): No    Lack of Transportation (Non-Medical): No  Physical Activity: Not on file  Stress: Not on file  Social Connections: Not on file  Intimate Partner Violence: Not At Risk (10/30/2022)   Received from [facility]   Humiliation, Afraid, Rape, and Kick questionnaire    Fear of Current or Ex-Partner: No    Emotionally Abused: No    Physically Abused: No    Sexually Abused: No    FAMILY HISTORY: Family History  Problem Relation Age of Onset   Healthy Mother    Healthy Father    Cancer - Colon Maternal Grandfather    Breast cancer Paternal Grandmother        25s   Breast cancer Maternal Aunt        not sure of age    ALLERGIES:  has No Known Allergies.  MEDICATIONS:  Current Outpatient Medications   Medication Sig Dispense Refill   cyclobenzaprine (FLEXERIL) 5 MG tablet      fluticasone (FLONASE) 50 MCG/ACT nasal spray SHAKE LIQUID AND USE 2 SPRAYS IN EACH NOSTRIL DAILY     gabapentin (NEURONTIN) 300 MG capsule Take 300 mg by mouth daily.     pantoprazole (PROTONIX) 20 MG tablet Take 1 tablet by mouth daily.     No current facility-administered medications for this visit.     PHYSICAL EXAMINATION:  Vitals:   06/20/23 1242  BP: 112/69  Pulse: 66  Resp: 18  Temp: 97.9 F (36.6 C)   Filed Weights   06/20/23 1242  Weight: 142 lb 8 oz (64.6 kg)    Physical Exam Constitutional:      General: She is not in acute distress.    Comments: Patient ambulates independently  HENT:     Head: Normocephalic and atraumatic.  Eyes:     General: No scleral icterus. Cardiovascular:  Rate and Rhythm: Normal rate.  Pulmonary:     Effort: Pulmonary effort is normal. No respiratory distress.     Breath sounds: No wheezing.  Abdominal:     General: There is no distension.     Palpations: Abdomen is soft.  Musculoskeletal:        General: No deformity. Normal range of motion.     Cervical back: Normal range of motion and neck supple.  Skin:    General: Skin is warm and dry.     Findings: No erythema or rash.  Neurological:     Mental Status: She is alert and oriented to person, place, and time. Mental status is at baseline.     Cranial Nerves: No cranial nerve deficit.  Psychiatric:        Mood and Affect: Mood normal.      LABORATORY DATA:  I have reviewed the data as listed Lab Results  Component Value Date   WBC 6.0 06/05/2023   HGB 12.9 06/05/2023   HCT 39.7 06/05/2023   MCV 87.1 06/05/2023   PLT 188 06/05/2023   No results for input(s): "NA", "K", "CL", "CO2", "GLUCOSE", "BUN", "CREATININE", "CALCIUM", "GFRNONAA", "GFRAA", "PROT", "ALBUMIN", "AST", "ALT", "ALKPHOS", "BILITOT", "BILIDIR", "IBILI" in the last 8760 hours. Iron/TIBC/Ferritin/ %Sat    Component  Value Date/Time   IRON 83 06/05/2023 1328   TIBC 538 (H) 06/05/2023 1328   FERRITIN 6 (L) 06/05/2023 1328   IRONPCTSAT 15 06/05/2023 1328      RADIOGRAPHIC STUDIES: I have personally reviewed the radiological images as listed and agreed with the findings in the report.  No results found.

## 2023-07-15 NOTE — Assessment & Plan Note (Addendum)
Her lab work up is consistent with von willebrand diease.  VWF activity antigen ratio <0.7, with normal multimer levels, likely Type 52M Recommend DDAVP trial. Consider OCP

## 2023-07-17 ENCOUNTER — Inpatient Hospital Stay: Payer: Medicaid Other

## 2023-07-18 ENCOUNTER — Inpatient Hospital Stay: Payer: Medicaid Other

## 2023-07-21 MED ORDER — B-12 2500 MCG SL SUBL: 1.0000 | SUBLINGUAL_TABLET | Freq: Every day | SUBLINGUAL | 2 refills | Status: AC

## 2023-07-21 NOTE — Addendum Note (Signed)
Addended by: Rickard Patience on: 07/21/2023 12:51 PM   Modules accepted: Orders

## 2023-07-21 NOTE — Telephone Encounter (Signed)
Please adjust / schedule appt and inform pt:   Please add labs [facility] willebrand panel only) to 10/10 prior to infusion.   labs in 2 months (cbc,iron,ferr, retic panel, B12) MD/venofer/ B12 inj a few days after labs

## 2023-07-24 ENCOUNTER — Inpatient Hospital Stay: Payer: Medicaid Other

## 2023-07-24 ENCOUNTER — Inpatient Hospital Stay: Payer: Medicaid Other | Attending: Oncology

## 2023-07-24 VITALS — BP 103/64 | HR 67 | Temp 97.0°F | Resp 18

## 2023-07-24 DIAGNOSIS — D509 Iron deficiency anemia, unspecified: Secondary | ICD-10-CM | POA: Diagnosis present

## 2023-07-24 DIAGNOSIS — E538 Deficiency of other specified B group vitamins: Secondary | ICD-10-CM | POA: Insufficient documentation

## 2023-07-24 DIAGNOSIS — D6800 Von Willebrand disease, unspecified: Secondary | ICD-10-CM

## 2023-07-24 MED ORDER — CYANOCOBALAMIN 1000 MCG/ML IJ SOLN
1000.0000 ug | Freq: Once | INTRAMUSCULAR | Status: AC
  Administered 2023-07-24: 1000 ug via INTRAMUSCULAR
  Filled 2023-07-24: qty 1

## 2023-07-24 MED ORDER — SODIUM CHLORIDE 0.9 % IV SOLN
200.0000 mg | Freq: Once | INTRAVENOUS | Status: AC
  Administered 2023-07-24: 200 mg via INTRAVENOUS
  Filled 2023-07-24: qty 200

## 2023-07-24 MED ORDER — SODIUM CHLORIDE 0.9 % IV SOLN
Freq: Once | INTRAVENOUS | Status: AC
  Filled 2023-07-24: qty 250

## 2023-07-25 LAB — VON WILLEBRAND PANEL
Coagulation Factor VIII: 68 % (ref 56–140)
Ristocetin Co-factor, Plasma: 39 % — ABNORMAL LOW (ref 50–200)
Von Willebrand Antigen, Plasma: 50 % (ref 50–200)

## 2023-07-25 LAB — COAG STUDIES INTERP REPORT

## 2023-08-02 ENCOUNTER — Ambulatory Visit
Admission: EM | Admit: 2023-08-02 | Discharge: 2023-08-02 | Disposition: A | Discharge status: Home / Self Care | Payer: Medicaid Other | Attending: Family Medicine | Admitting: Family Medicine

## 2023-08-02 ENCOUNTER — Encounter: Payer: Self-pay | Admitting: Emergency Medicine

## 2023-08-02 ENCOUNTER — Ambulatory Visit (INDEPENDENT_AMBULATORY_CARE_PROVIDER_SITE_OTHER): Payer: Medicaid Other

## 2023-08-02 DIAGNOSIS — M542 Cervicalgia: Secondary | ICD-10-CM

## 2023-08-02 DIAGNOSIS — S161XXA Strain of muscle, fascia and tendon at neck level, initial encounter: Secondary | ICD-10-CM | POA: Diagnosis not present

## 2023-08-02 MED ORDER — NAPROXEN 500 MG PO TABS: 500.0000 mg | ORAL_TABLET | Freq: Two times a day (BID) | ORAL | 0 refills | Status: DC

## 2023-08-02 MED ORDER — ACETAMINOPHEN 325 MG PO TABS
975.0000 mg | ORAL_TABLET | Freq: Once | ORAL | Status: AC
  Administered 2023-08-02: 975 mg via ORAL

## 2023-08-02 MED ORDER — CYCLOBENZAPRINE HCL 5 MG PO TABS: 5.0000 mg | ORAL_TABLET | Freq: Three times a day (TID) | ORAL | 0 refills | Status: DC | PRN

## 2023-08-02 NOTE — Discharge Instructions (Addendum)
Your neck x-rays were normal therefore there were not any dislocated or broken bones.  You likely have a muscle injury and will need to use anti-inflammatory pain medicine and muscle relaxers for relief.  Lidocaine patches and Tylenol can also provide additional relief.  If medication was prescribed, stop by the pharmacy to pick up your prescriptions.  For your  pain, Take 1500 mg Tylenol twice a day, take muscle relaxer (Flexeril) twice a day, take Naprosyn twice a day,  as needed for pain. Apply warm compresses intermittently, as needed.  As pain recedes, begin normal activities slowly as tolerated.  Follow up with primary care provider or an orthopedic provider, if symptoms persist.  Watch for worsening symptoms such as an increasing weakness or loss of sensation, increasing pain and/or the loss of bladder or bowel function. Should any of these occur, go to the emergency department immediately.

## 2023-08-02 NOTE — ED Provider Notes (Signed)
[facility]    CSN: [id number] Arrival date & time: 08/02/23  4098      History   Chief Complaint Chief Complaint  Patient presents with   Neck Pain    HPI  HPI Nicole Hartman is a 27 y.o. female.   [name] presents for left neck pain that started while drying off with a towel this morning. She was trying to dry her back and shrugged and her a loud pop.  She put some IcyHot on the area but that didn't work.  Took ibuprofen. Has difficulty moving her neck.    Pain rated 6/10 and described as tight and aching.  Pain becomes sharp when she moves her head.  She has never injuried her neck before.  No difficulty moving her arm.  Continues to have pain with movement.    No head pain. Endorses some upper back pain.   Past Medical History:  Diagnosis Date   Anemia    Asthma    exercise induced   Cesarean delivery delivered 03/02/2016   Depression 2014   GERD (gastroesophageal reflux disease)    Headache    h/o migraines   PTSD (post-traumatic stress disorder) 2014   Thrombocytopenia (HCC)     Patient Active Problem List   Diagnosis Date Noted   Von Willebrand disease (HCC) 07/15/2023   IDA (iron deficiency anemia) 02/10/2023   Numbness 02/26/2022   Rectal bleeding    Polyp of sigmoid colon    Pernicious anemia 11/13/2021   History of cesarean delivery 09/14/2021   Back pain affecting pregnancy in third trimester 08/27/2021   B12 deficiency 07/13/2021   Absolute anemia 07/13/2021   Thrombocytopenia affecting pregnancy, antepartum (HCC) 07/10/2021   Depressed mood 06/14/2021   History of cesarean delivery affecting pregnancy 06/14/2021   Prenatal care, subsequent pregnancy, second trimester 02/23/2021   Supervision of high risk pregnancy, antepartum 07/02/2019   Menorrhagia 04/11/2017   PTSD (post-traumatic stress disorder) 01/08/2013   MDD (major depressive disorder), recurrent episode, severe (HCC) 01/08/2013    Past Surgical History:   Procedure Laterality Date   CESAREAN SECTION N/A 03/01/2016   Procedure: CESAREAN SECTION;  Surgeon: Conard Novak, MD;  Location: [facility]S;  Service: Obstetrics;  Laterality: N/A;   CESAREAN SECTION N/A 02/11/2020   Procedure: CESAREAN SECTION;  Surgeon: Conard Novak, MD;  Location: [facility];  Service: Obstetrics;  Laterality: N/A;  RNFA   CESAREAN SECTION WITH BILATERAL TUBAL LIGATION N/A 09/14/2021   Procedure: CESAREAN SECTION WITH BILATERAL TUBAL LIGATION;  Surgeon: Conard Novak, MD;  Location: [facility];  Service: Obstetrics;  Laterality: N/A;   COLONOSCOPY WITH PROPOFOL N/A 02/14/2022   Procedure: COLONOSCOPY WITH PROPOFOL;  Surgeon: Toney Reil, MD;  Location: [facility] ENDOSCOPY;  Service: Gastroenterology;  Laterality: N/A;   ESOPHAGOGASTRODUODENOSCOPY (EGD) WITH PROPOFOL N/A 02/14/2022   Procedure: ESOPHAGOGASTRODUODENOSCOPY (EGD) WITH PROPOFOL;  Surgeon: Toney Reil, MD;  Location: [facility] ENDOSCOPY;  Service: Gastroenterology;  Laterality: N/A;    OB History     Gravida  3   Para  3   Term  3   Preterm      AB      Living  3      SAB      IAB      Ectopic      Multiple  0   Live Births  3            Home Medications    Prior to Admission medications  Medication Sig Start Date End Date Taking? Authorizing Provider  naproxen (NAPROSYN) 500 MG tablet Take 1 tablet (500 mg total) by mouth 2 (two) times daily with a meal. 08/02/23  Yes Caria Transue, DO  Cyanocobalamin (B-12) 2500 MCG SUBL Place 1 tablet under the tongue daily. 07/21/23   Rickard Patience, MD  cyclobenzaprine (FLEXERIL) 5 MG tablet Take 1 tablet (5 mg total) by mouth 3 (three) times daily as needed for muscle spasms. 08/02/23   Malique Driskill, Seward Meth, DO  fluticasone (FLONASE) 50 MCG/ACT nasal spray SHAKE LIQUID AND USE 2 SPRAYS IN EACH NOSTRIL DAILY 08/23/21   [provider]  gabapentin (NEURONTIN) 300 MG capsule Take 300 mg by mouth daily. 02/18/22   [provider]   pantoprazole (PROTONIX) 20 MG tablet Take 1 tablet by mouth daily. 12/02/22 12/02/23  [provider]    Family History Family History  Problem Relation Age of Onset   Healthy Mother    Healthy Father    Cancer - Colon Maternal Grandfather    Breast cancer Paternal Grandmother        47s   Breast cancer Maternal Aunt        not sure of age    Social History Social History   Tobacco Use   Smoking status: Never   Smokeless tobacco: Never  Vaping Use   Vaping status: Never Used  Substance Use Topics   Alcohol use: No   Drug use: No     Allergies   Patient has no known allergies.   Review of Systems Review of Systems: egative unless otherwise stated in HPI.      Physical Exam Triage Vital Signs ED Triage Vitals  Encounter Vitals Group     BP 08/02/23 0848 111/73     Systolic BP Percentile --      Diastolic BP Percentile --      Pulse Rate 08/02/23 0848 61     Resp 08/02/23 0848 14     Temp 08/02/23 0848 98 F (36.7 C)     Temp Source 08/02/23 0848 Oral     SpO2 08/02/23 0848 99 %     Weight 08/02/23 0846 143 lb (64.9 kg)     Height 08/02/23 0846 5' (1.524 m)     Head Circumference --      Peak Flow --      Pain Score 08/02/23 0846 6     Pain Loc --      Pain Education --      Exclude from Growth Chart --    No data found.  Updated Vital Signs BP 111/73 (BP Location: Left Arm)   Pulse 61   Temp 98 F (36.7 C) (Oral)   Resp 14   Ht 5' (1.524 m)   Wt 64.9 kg   LMP 07/27/2023 (Approximate) Comment: pt reports she has started spotting and cramping, and that she is on birth control, "no chances of pregnancy".  SpO2 99%   Breastfeeding No   BMI 27.93 kg/m   Visual Acuity Right Eye Distance:   Left Eye Distance:   Bilateral Distance:    Right Eye Near:   Left Eye Near:    Bilateral Near:     Physical Exam GEN: well appearing female in no acute distress  CVS: well perfused  RESP: speaking in full sentences without pause, no  respiratory distress  MSK: spine: - Inspection: no gross deformity or asymmetry, swelling or ecchymosis. No skin changes  - Palpation: TTP over the  midline and lateral cervical spinous processes and paraspinal muscle tenderness, trapezius hypertonicity - ROM: Decreased active ROM in flexion, extension and rotation of the cervical spine - Strength: 5/5 strength upper extremities - Neuro: sensation intact  - Special testing: Spurling's attempted but patient did not tolerate set up so it was aborted SKIN: warm, dry, no overly skin rash or erythema    UC Treatments / Results  Labs (all labs ordered are listed, but only abnormal results are displayed) Labs Reviewed - No data to display  EKG   Radiology DG Cervical Spine Complete  Result Date: 08/02/2023 CLINICAL DATA:  Acute left neck pop and pain this morning while drying back. EXAM: CERVICAL SPINE - COMPLETE 4+ VIEW COMPARISON:  None Available. FINDINGS: There is no evidence of cervical spine fracture or prevertebral soft tissue swelling. Alignment is normal. No other significant bone abnormalities are identified. IMPRESSION: Negative cervical spine radiographs. Electronically Signed   By: Danae Orleans M.D.   On: 08/02/2023 10:22     Procedures Procedures (including critical care time)  Medications Ordered in UC Medications  acetaminophen (TYLENOL) tablet 975 mg (975 mg Oral Given 08/02/23 8119)    Initial Impression / Assessment and Plan / UC Course  I have reviewed the triage vital signs and the nursing notes.  Pertinent labs & imaging results that were available during my care of the patient were reviewed by me and considered in my medical decision making (see chart for details).      Pt is a 27 y.o.  female with acute cervical pain after drying off with a towel this morning.  Obtained cervical plain films that were personally reviewed by me and did not show any fracture or significant malalignment.   Radiologist report  reviewed.  Patient given Tylenol 975 as she is recently took NSAID prior to arrival.  Patient to gradually return to normal activities, as tolerated and continue ordinary activities within the limits permitted by pain. Prescribed Naproxen sodium  and muscle relaxer  for pain relief.  Advised patient to avoid other NSAIDs while taking Naprosyn. Tylenol and Lidocaine patches PRN for multimodal pain relief. Counseled patient on red flag symptoms and when to seek immediate care.  No red flags suggesting cauda equina syndrome or progressive major motor weakness. Patient to follow up with orthopedic provider if symptoms do not improve with conservative treatment.  Return and ED precautions given.    Discussed MDM, treatment plan and plan for follow-up with patient who agrees with plan.   Final Clinical Impressions(s) / UC Diagnoses   Final diagnoses:  Acute strain of neck muscle, initial encounter     Discharge Instructions      Your neck x-rays were normal therefore there were not any dislocated or broken bones.  You likely have a muscle injury and will need to use anti-inflammatory pain medicine and muscle relaxers for relief.  Lidocaine patches and Tylenol can also provide additional relief.  If medication was prescribed, stop by the pharmacy to pick up your prescriptions.  For your  pain, Take 1500 mg Tylenol twice a day, take muscle relaxer (Flexeril) twice a day, take Naprosyn twice a day,  as needed for pain. Apply warm compresses intermittently, as needed.  As pain recedes, begin normal activities slowly as tolerated.  Follow up with primary care provider or an orthopedic provider, if symptoms persist.  Watch for worsening symptoms such as an increasing weakness or loss of sensation, increasing pain and/or the loss of bladder or bowel  function. Should any of these occur, go to the emergency department immediately.        ED Prescriptions     Medication Sig Dispense Auth. Provider    cyclobenzaprine (FLEXERIL) 5 MG tablet Take 1 tablet (5 mg total) by mouth 3 (three) times daily as needed for muscle spasms. 30 tablet Natassja Ollis, DO   naproxen (NAPROSYN) 500 MG tablet Take 1 tablet (500 mg total) by mouth 2 (two) times daily with a meal. 30 tablet Kitzia Camus, DO      PDMP not reviewed this encounter.   Katha Cabal, DO 08/02/23 1535

## 2023-08-02 NOTE — ED Triage Notes (Signed)
Patient states that she was trying dry her back off early this morning and felt a pop on the left side of her neck.  Patient reports pain and stiffness on the left side of her neck.

## 2023-08-08 ENCOUNTER — Other Ambulatory Visit: Payer: Medicaid Other

## 2023-08-12 ENCOUNTER — Ambulatory Visit: Payer: Medicaid Other

## 2023-08-12 ENCOUNTER — Ambulatory Visit: Payer: Medicaid Other | Admitting: Oncology

## 2023-09-23 ENCOUNTER — Inpatient Hospital Stay: Payer: Medicaid Other | Attending: Oncology

## 2023-09-25 ENCOUNTER — Inpatient Hospital Stay: Payer: Medicaid Other

## 2023-09-25 ENCOUNTER — Inpatient Hospital Stay: Payer: Medicaid Other | Admitting: Oncology

## 2023-09-25 ENCOUNTER — Other Ambulatory Visit: Payer: Medicaid Other

## 2023-10-24 ENCOUNTER — Inpatient Hospital Stay: Payer: Medicaid Other | Attending: Oncology

## 2023-10-28 ENCOUNTER — Ambulatory Visit: Payer: Medicaid Other | Admitting: Oncology

## 2023-10-28 ENCOUNTER — Ambulatory Visit: Payer: Medicaid Other

## 2023-10-31 ENCOUNTER — Inpatient Hospital Stay: Payer: Medicaid Other | Attending: Oncology

## 2023-10-31 DIAGNOSIS — E538 Deficiency of other specified B group vitamins: Secondary | ICD-10-CM | POA: Diagnosis not present

## 2023-10-31 DIAGNOSIS — Z793 Long term (current) use of hormonal contraceptives: Secondary | ICD-10-CM | POA: Diagnosis not present

## 2023-10-31 DIAGNOSIS — D6800 Von Willebrand disease, unspecified: Secondary | ICD-10-CM | POA: Insufficient documentation

## 2023-10-31 DIAGNOSIS — N920 Excessive and frequent menstruation with regular cycle: Secondary | ICD-10-CM | POA: Diagnosis not present

## 2023-10-31 DIAGNOSIS — D500 Iron deficiency anemia secondary to blood loss (chronic): Secondary | ICD-10-CM

## 2023-10-31 DIAGNOSIS — D509 Iron deficiency anemia, unspecified: Secondary | ICD-10-CM | POA: Insufficient documentation

## 2023-10-31 DIAGNOSIS — N921 Excessive and frequent menstruation with irregular cycle: Secondary | ICD-10-CM

## 2023-10-31 LAB — CBC WITH DIFFERENTIAL/PLATELET
Abs Immature Granulocytes: 0.01 10*3/uL (ref 0.00–0.07)
Basophils Absolute: 0 10*3/uL (ref 0.0–0.1)
Basophils Relative: 1 %
Eosinophils Absolute: 0.1 10*3/uL (ref 0.0–0.5)
Eosinophils Relative: 2 %
HCT: 38.1 % (ref 36.0–46.0)
Hemoglobin: 12.2 g/dL (ref 12.0–15.0)
Immature Granulocytes: 0 %
Lymphocytes Relative: 30 %
Lymphs Abs: 1.2 10*3/uL (ref 0.7–4.0)
MCH: 29.2 pg (ref 26.0–34.0)
MCHC: 32 g/dL (ref 30.0–36.0)
MCV: 91.1 fL (ref 80.0–100.0)
Monocytes Absolute: 0.3 10*3/uL (ref 0.1–1.0)
Monocytes Relative: 6 %
Neutro Abs: 2.4 10*3/uL (ref 1.7–7.7)
Neutrophils Relative %: 61 %
Platelets: 144 10*3/uL — ABNORMAL LOW (ref 150–400)
RBC: 4.18 MIL/uL (ref 3.87–5.11)
RDW: 12 % (ref 11.5–15.5)
WBC: 4 10*3/uL (ref 4.0–10.5)
nRBC: 0 % (ref 0.0–0.2)

## 2023-10-31 LAB — IRON AND TIBC
Iron: 65 ug/dL (ref 28–170)
Saturation Ratios: 17 % (ref 10.4–31.8)
TIBC: 388 ug/dL (ref 250–450)
UIBC: 323 ug/dL

## 2023-10-31 LAB — RETIC PANEL
Immature Retic Fract: 7.4 % (ref 2.3–15.9)
RBC.: 4.19 MIL/uL (ref 3.87–5.11)
Retic Count, Absolute: 64.5 10*3/uL (ref 19.0–186.0)
Retic Ct Pct: 1.5 % (ref 0.4–3.1)
Reticulocyte Hemoglobin: 32.1 pg (ref >27.9)

## 2023-10-31 LAB — FERRITIN: Ferritin: 40 ng/mL (ref 11–307)

## 2023-10-31 LAB — VITAMIN B12: Vitamin B-12: 533 pg/mL (ref 180–914)

## 2023-11-03 ENCOUNTER — Inpatient Hospital Stay: Payer: Medicaid Other

## 2023-11-03 ENCOUNTER — Inpatient Hospital Stay (HOSPITAL_BASED_OUTPATIENT_CLINIC_OR_DEPARTMENT_OTHER): Payer: Medicaid Other | Admitting: Oncology

## 2023-11-03 ENCOUNTER — Encounter: Payer: Self-pay | Admitting: Oncology

## 2023-11-03 VITALS — BP 135/85 | HR 85 | Temp 98.6°F | Resp 19 | Wt 146.3 lb

## 2023-11-03 DIAGNOSIS — E538 Deficiency of other specified B group vitamins: Secondary | ICD-10-CM | POA: Diagnosis not present

## 2023-11-03 DIAGNOSIS — D500 Iron deficiency anemia secondary to blood loss (chronic): Secondary | ICD-10-CM

## 2023-11-03 DIAGNOSIS — D6800 Von Willebrand disease, unspecified: Secondary | ICD-10-CM | POA: Diagnosis not present

## 2023-11-03 DIAGNOSIS — N921 Excessive and frequent menstruation with irregular cycle: Secondary | ICD-10-CM

## 2023-11-03 NOTE — Assessment & Plan Note (Signed)
Iron deficiency anemia  Labs are reviewed and discussed with patient. Lab Results  Component Value Date   IRON 65 10/31/2023   TIBC 388 10/31/2023   IRONPCTSAT 17 10/31/2023   FERRITIN 40 10/31/2023    Both hemoglobin and iron panel have improved and normalized.  Hold off Venofer

## 2023-11-03 NOTE — Assessment & Plan Note (Signed)
B12 level has improved. Parietal Cell Antibody-IgG + intrinsic antibody +  EGD in May 2023 by Dr. Allegra Lai.  Has been on B12 injection monthly. Most recent injection she developed local bruising.  B12 has improved. Continue sublingual B12 daily.

## 2023-11-03 NOTE — Assessment & Plan Note (Signed)
Improved after taking OCP

## 2023-11-03 NOTE — Assessment & Plan Note (Addendum)
Her lab work up is consistent with von willebrand diease.  VWF activity antigen ratio <0.7, with normal multimer levels, likely Type 94M Recommend DDAVP trial. Currently her menorrhagia has largely improved after OCP use. VWD activity and antigen level have improved as well.  She would like to hold off DDAVP trial;  Monitor symptoms  Meanwhile I will also check : VW genetic profile [phone] and Factor VIII genetic profile [ [phone]

## 2023-11-03 NOTE — Progress Notes (Signed)
Hematology/Oncology Progress note [phone] [phone]      CHIEF COMPLAINTS/REASON FOR VISIT:  Follow up for iron deficiency anemia and vitamin B12 deficiency  ASSESSMENT & PLAN:   Von Willebrand disease (HCC) Her lab work up is consistent with von willebrand diease.  VWF activity antigen ratio <0.7, with normal multimer levels, likely Type 18M Recommend DDAVP trial. Currently her menorrhagia has largely improved after OCP use. VWD activity and antigen level have improved as well.  She would like to hold off DDAVP trial;  Monitor symptoms  IDA (iron deficiency anemia) Iron deficiency anemia  Labs are reviewed and discussed with patient. Lab Results  Component Value Date   IRON 65 10/31/2023   TIBC 388 10/31/2023   IRONPCTSAT 17 10/31/2023   FERRITIN 40 10/31/2023    Both hemoglobin and iron panel have improved and normalized.  Hold off Venofer   B12 deficiency B12 level has improved. Parietal Cell Antibody-IgG + intrinsic antibody +  EGD in May 2023 by Dr. Allegra Lai.  Has been on B12 injection monthly. Most recent injection she developed local bruising.  B12 has improved. Continue sublingual B12 daily.   Menorrhagia Improved after taking OCP   Orders Placed This Encounter  Procedures   CBC with Differential (Cancer Center Only)    Standing Status:   Future    Expected Date:   05/02/2024    Expiration Date:   11/02/2024   Iron and TIBC([facility])    Standing Status:   Future    Expected Date:   05/02/2024    Expiration Date:   11/02/2024   Ferritin    Standing Status:   Future    Expected Date:   05/02/2024    Expiration Date:   11/02/2024   Retic Panel    Standing Status:   Future    Expected Date:   05/02/2024    Expiration Date:   11/02/2024   Follow up in 6 months. All questions were answered. The patient knows to call the clinic with any problems, questions or concerns.  Rickard Patience, MD, PhD [facility] Hematology  Oncology 11/03/2023   INTERVAL HISTORY Nicole Hartman is a 28 y.o. female who has above history reviewed by me today presents for follow up visit for management of iron deficiency anemia and vitamin B12 deficiency Her right lower extremity weakness has improved.  she take sublingual Vitamin B12.  + menorrhagia has improved after started on OCP   Review of Systems  Constitutional:  Negative for appetite change, chills, fatigue and fever.  HENT:   Negative for hearing loss and voice change.   Eyes:  Negative for eye problems.  Respiratory:  Negative for chest tightness and cough.   Cardiovascular:  Negative for chest pain.  Gastrointestinal:  Negative for abdominal distention, abdominal pain and blood in stool.  Endocrine: Negative for hot flashes.  Genitourinary:  Positive for menstrual problem. Negative for difficulty urinating and frequency.   Musculoskeletal:  Negative for arthralgias.  Skin:  Negative for itching and rash.  Neurological:  Negative for extremity weakness.  Hematological:  Negative for adenopathy.  Psychiatric/Behavioral:  Negative for confusion.     MEDICAL HISTORY:  Past Medical History:  Diagnosis Date   Anemia    Asthma    exercise induced   Cesarean delivery delivered 03/02/2016   Depression 2014   GERD (gastroesophageal reflux disease)    Headache    h/o migraines   PTSD (post-traumatic stress disorder) 2014   Thrombocytopenia (HCC)  SURGICAL HISTORY: Past Surgical History:  Procedure Laterality Date   CESAREAN SECTION N/A 03/01/2016   Procedure: CESAREAN SECTION;  Surgeon: Conard Novak, MD;  Location: [facility] ORS;  Service: Obstetrics;  Laterality: N/A;   CESAREAN SECTION N/A 02/11/2020   Procedure: CESAREAN SECTION;  Surgeon: Conard Novak, MD;  Location: [facility] ORS;  Service: Obstetrics;  Laterality: N/A;  RNFA   CESAREAN SECTION WITH BILATERAL TUBAL LIGATION N/A 09/14/2021   Procedure: CESAREAN SECTION WITH BILATERAL TUBAL  LIGATION;  Surgeon: Conard Novak, MD;  Location: [facility] ORS;  Service: Obstetrics;  Laterality: N/A;   COLONOSCOPY WITH PROPOFOL N/A 02/14/2022   Procedure: COLONOSCOPY WITH PROPOFOL;  Surgeon: Toney Reil, MD;  Location: [facility] ENDOSCOPY;  Service: Gastroenterology;  Laterality: N/A;   ESOPHAGOGASTRODUODENOSCOPY (EGD) WITH PROPOFOL N/A 02/14/2022   Procedure: ESOPHAGOGASTRODUODENOSCOPY (EGD) WITH PROPOFOL;  Surgeon: Toney Reil, MD;  Location: [facility] ENDOSCOPY;  Service: Gastroenterology;  Laterality: N/A;    SOCIAL HISTORY: Social History   Socioeconomic History   Marital status: Married    Spouse name: Rolm Gala   Number of children: 3   Years of education: college   Highest education level: Not on file  Occupational History   Occupation: Sales executive  Tobacco Use   Smoking status: Never   Smokeless tobacco: Never  Vaping Use   Vaping status: Never Used  Substance and Sexual Activity   Alcohol use: No   Drug use: No   Sexual activity: Yes    Partners: Male    Birth control/protection: Surgical  Other Topics Concern   Not on file  Social History Narrative   Lives at home with husband and three children.   Right-handed.   One cup caffeine per day.   Social Drivers of Corporate investment banker Strain: Low Risk  (10/30/2022)   Received from [facility], [facility]   Overall Financial Resource Strain (CARDIA)    Difficulty of Paying Living Expenses: Not very hard  Food Insecurity: No Food Insecurity (10/30/2022)   Received from [facility], [facility]   Hunger Vital Sign    Worried About Running Out of Food in the Last Year: Never true    Ran Out of Food in the Last Year: Never true  Transportation Needs: No Transportation Needs (10/30/2022)   Received from [facility], [facility]   [facility] - Transportation    Lack of Transportation (Medical): No    Lack of Transportation (Non-Medical): No  Physical Activity: Not on file  Stress:  Not on file  Social Connections: Not on file  Intimate Partner Violence: Not At Risk (10/30/2022)   Received from [facility], [facility]   Humiliation, Afraid, Rape, and Kick questionnaire    Fear of Current or Ex-Partner: No    Emotionally Abused: No    Physically Abused: No    Sexually Abused: No    FAMILY HISTORY: Family History  Problem Relation Age of Onset   Healthy Mother    Healthy Father    Cancer - Colon Maternal Grandfather    Breast cancer Paternal Grandmother        8s   Breast cancer Maternal Aunt        not sure of age    ALLERGIES:  has no known allergies.  MEDICATIONS:  Current Outpatient Medications  Medication Sig Dispense Refill   albuterol (VENTOLIN HFA) 108 (90 Base) MCG/ACT inhaler Inhale into the lungs.     Cholecalciferol (  VITAMIN D3) 25 MCG (1000 UT) CAPS Take by mouth.     fluticasone (FLONASE) 50 MCG/ACT nasal spray SHAKE LIQUID AND USE 2 SPRAYS IN EACH NOSTRIL DAILY     fluticasone (FLOVENT HFA) 110 MCG/ACT inhaler SMARTSIG:2 Puff(s) By Mouth Twice Daily     gabapentin (NEURONTIN) 300 MG capsule Take 300 mg by mouth daily.     pantoprazole (PROTONIX) 20 MG tablet Take 1 tablet by mouth daily.     Cyanocobalamin (B-12) 2500 MCG SUBL Place 1 tablet under the tongue daily. 30 tablet 2   cyclobenzaprine (FLEXERIL) 5 MG tablet Take 1 tablet (5 mg total) by mouth 3 (three) times daily as needed for muscle spasms. 30 tablet 0   naproxen (NAPROSYN) 500 MG tablet Take 1 tablet (500 mg total) by mouth 2 (two) times daily with a meal. 30 tablet 0   No current facility-administered medications for this visit.     PHYSICAL EXAMINATION:  Vitals:   11/03/23 1400  BP: 135/85  Pulse: 85  Resp: 19  Temp: 98.6 F (37 C)  SpO2: 99%   Filed Weights   11/03/23 1400  Weight: 146 lb 4.8 oz (66.4 kg)    Physical Exam Constitutional:      General: She is not in acute distress.    Comments: Patient ambulates independently  HENT:     Head:  Normocephalic and atraumatic.  Eyes:     General: No scleral icterus. Cardiovascular:     Rate and Rhythm: Normal rate.  Pulmonary:     Effort: Pulmonary effort is normal. No respiratory distress.  Abdominal:     General: There is no distension.     Palpations: Abdomen is soft.  Musculoskeletal:        General: No deformity. Normal range of motion.     Cervical back: Normal range of motion and neck supple.  Skin:    General: Skin is warm and dry.     Findings: No erythema or rash.  Neurological:     Mental Status: She is alert and oriented to person, place, and time. Mental status is at baseline.     Cranial Nerves: No cranial nerve deficit.  Psychiatric:        Mood and Affect: Mood normal.      LABORATORY DATA:  I have reviewed the data as listed Lab Results  Component Value Date   WBC 4.0 10/31/2023   HGB 12.2 10/31/2023   HCT 38.1 10/31/2023   MCV 91.1 10/31/2023   PLT 144 (L) 10/31/2023   No results for input(s): "NA", "K", "CL", "CO2", "GLUCOSE", "BUN", "CREATININE", "CALCIUM", "GFRNONAA", "GFRAA", "PROT", "ALBUMIN", "AST", "ALT", "ALKPHOS", "BILITOT", "BILIDIR", "IBILI" in the last 8760 hours. Iron/TIBC/Ferritin/ %Sat    Component Value Date/Time   IRON 65 10/31/2023 0912   TIBC 388 10/31/2023 0912   FERRITIN 40 10/31/2023 0912   IRONPCTSAT 17 10/31/2023 0912      RADIOGRAPHIC STUDIES: I have personally reviewed the radiological images as listed and agreed with the findings in the report.  No results found.

## 2024-05-03 ENCOUNTER — Other Ambulatory Visit: Payer: Medicaid Other

## 2024-05-03 ENCOUNTER — Inpatient Hospital Stay

## 2024-05-05 ENCOUNTER — Inpatient Hospital Stay: Payer: Medicaid Other | Admitting: Oncology

## 2024-05-05 ENCOUNTER — Inpatient Hospital Stay: Payer: Medicaid Other

## 2024-05-10 ENCOUNTER — Inpatient Hospital Stay: Attending: Oncology

## 2024-05-10 DIAGNOSIS — D6800 Von Willebrand disease, unspecified: Secondary | ICD-10-CM

## 2024-05-10 DIAGNOSIS — D509 Iron deficiency anemia, unspecified: Secondary | ICD-10-CM | POA: Diagnosis present

## 2024-05-10 DIAGNOSIS — Z79899 Other long term (current) drug therapy: Secondary | ICD-10-CM | POA: Insufficient documentation

## 2024-05-10 LAB — CBC WITH DIFFERENTIAL (CANCER CENTER ONLY)
Abs Immature Granulocytes: 0 K/uL (ref 0.00–0.07)
Basophils Absolute: 0 K/uL (ref 0.0–0.1)
Basophils Relative: 1 %
Eosinophils Absolute: 0.1 K/uL (ref 0.0–0.5)
Eosinophils Relative: 3 %
HCT: 34.8 % — ABNORMAL LOW (ref 36.0–46.0)
Hemoglobin: 11.4 g/dL — ABNORMAL LOW (ref 12.0–15.0)
Immature Granulocytes: 0 %
Lymphocytes Relative: 42 %
Lymphs Abs: 1.5 K/uL (ref 0.7–4.0)
MCH: 29 pg (ref 26.0–34.0)
MCHC: 32.8 g/dL (ref 30.0–36.0)
MCV: 88.5 fL (ref 80.0–100.0)
Monocytes Absolute: 0.3 K/uL (ref 0.1–1.0)
Monocytes Relative: 7 %
Neutro Abs: 1.7 K/uL (ref 1.7–7.7)
Neutrophils Relative %: 47 %
Platelet Count: 110 K/uL — ABNORMAL LOW (ref 150–400)
RBC: 3.93 MIL/uL (ref 3.87–5.11)
RDW: 12.5 % (ref 11.5–15.5)
WBC Count: 3.6 K/uL — ABNORMAL LOW (ref 4.0–10.5)
nRBC: 0 % (ref 0.0–0.2)

## 2024-05-10 LAB — RETIC PANEL
Immature Retic Fract: 8.7 % (ref 2.3–15.9)
RBC.: 3.9 MIL/uL (ref 3.87–5.11)
Retic Count, Absolute: 48 K/uL (ref 19.0–186.0)
Retic Ct Pct: 1.2 % (ref 0.4–3.1)
Reticulocyte Hemoglobin: 32.5 pg (ref >27.9)

## 2024-05-10 LAB — IRON AND TIBC
Iron: 78 ug/dL (ref 28–170)
Saturation Ratios: 21 % (ref 10.4–31.8)
TIBC: 377 ug/dL (ref 250–450)
UIBC: 299 ug/dL

## 2024-05-10 LAB — FERRITIN: Ferritin: 7 ng/mL — ABNORMAL LOW (ref 11–307)

## 2024-05-12 ENCOUNTER — Encounter: Payer: Self-pay | Admitting: Oncology

## 2024-05-12 ENCOUNTER — Inpatient Hospital Stay: Admitting: Oncology

## 2024-05-12 ENCOUNTER — Inpatient Hospital Stay

## 2024-05-12 VITALS — BP 114/69 | HR 80 | Temp 97.6°F | Resp 16 | Wt 132.0 lb

## 2024-05-12 VITALS — BP 115/68 | HR 69

## 2024-05-12 DIAGNOSIS — D500 Iron deficiency anemia secondary to blood loss (chronic): Secondary | ICD-10-CM | POA: Diagnosis not present

## 2024-05-12 DIAGNOSIS — E538 Deficiency of other specified B group vitamins: Secondary | ICD-10-CM

## 2024-05-12 DIAGNOSIS — N921 Excessive and frequent menstruation with irregular cycle: Secondary | ICD-10-CM | POA: Diagnosis not present

## 2024-05-12 DIAGNOSIS — D6800 Von Willebrand disease, unspecified: Secondary | ICD-10-CM

## 2024-05-12 DIAGNOSIS — D509 Iron deficiency anemia, unspecified: Secondary | ICD-10-CM | POA: Diagnosis not present

## 2024-05-12 MED ORDER — IRON SUCROSE 20 MG/ML IV SOLN
200.0000 mg | Freq: Once | INTRAVENOUS | Status: AC
  Administered 2024-05-12: 200 mg via INTRAVENOUS
  Filled 2024-05-12: qty 10

## 2024-05-12 MED ORDER — SODIUM CHLORIDE 0.9% FLUSH
10.0000 mL | Freq: Once | INTRAVENOUS | Status: AC | PRN
Start: 2024-05-12 — End: 2024-05-12
  Administered 2024-05-12: 10 mL
  Filled 2024-05-12: qty 10

## 2024-05-12 NOTE — Progress Notes (Signed)
 Patient tolerated Venofer  infusion well. Explained recommendation of 30 min post monitoring. Patient refused to wait post monitoring. Educated on what signs to watch for & to call with any concerns. No questions, discharged. Stable. AVS given

## 2024-05-12 NOTE — Assessment & Plan Note (Signed)
 Mildly improved after birth control patch.  However she continues to have ongoing heavy menstrual bleeding.

## 2024-05-12 NOTE — Progress Notes (Signed)
 Hematology/Oncology Progress note [phone] [phone]      CHIEF COMPLAINTS/REASON FOR VISIT:  Follow up for iron  deficiency anemia and vitamin B12 deficiency  ASSESSMENT & PLAN:   IDA (iron  deficiency anemia) Iron  deficiency anemia  Labs are reviewed and discussed with patient. Lab Results  Component Value Date   HGB 11.4 (L) 05/10/2024   TIBC 377 05/10/2024   IRONPCTSAT 21 05/10/2024   FERRITIN 7 (L) 05/10/2024     Recommend Venofer  weekly x 3.   Von Willebrand disease (HCC) Her lab work up is consistent with von willebrand diease.  VWF activity antigen ratio <0.7, with normal multimer levels, likely Type 74M Recommend DDAVP trial. Currently her menorrhagia has largely improved after birth control patch use. VWD activity and antigen level have improved as well.  Previously she would like to hold off DDAVP trial, she is interested now.  check : VW genetic profile [id number] and Factor VIII genetic profile [ 630619] results are pending.  Menorrhagia Mildly improved after birth control patch.  However she continues to have ongoing heavy menstrual bleeding.  B12 deficiency Pernicious anemia  parietal Cell AntibodyIgG + intrinsic antibody +  EGD in May 2023 by Dr. Unk. Has been on B12 1000mcg injection monthly. Most recent injection she developed local bruising.   Recommend patient to take sublingual B12 2500mcg daily.    Orders Placed This Encounter  Procedures   CBC with Differential (Cancer Center Only)    Standing Status:   Future    Expected Date:   11/12/2024    Expiration Date:   02/10/2025   Iron  and TIBC    Standing Status:   Future    Expected Date:   11/12/2024    Expiration Date:   02/10/2025   Ferritin    Standing Status:   Future    Expected Date:   11/12/2024    Expiration Date:   02/10/2025   Vitamin B12    Standing Status:   Future    Expected Date:   11/12/2024    Expiration Date:   02/10/2025   Follow up in 6 months. All  questions were answered. The patient knows to call the clinic with any problems, questions or concerns.  Zelphia Cap, MD, PhD [facility] Hematology Oncology 05/12/2024   INTERVAL HISTORY Nicole Hartman is a 28 y.o. female who has above history reviewed by me today presents for follow up visit for management of iron  deficiency anemia and vitamin B12 deficiency/pernicious anemia  She is currently off Vitamin B12.  + menorrhagia has improved after started on OCP   Review of Systems  Constitutional:  Negative for appetite change, chills, fatigue and fever.  HENT:   Negative for hearing loss and voice change.   Eyes:  Negative for eye problems.  Respiratory:  Negative for chest tightness and cough.   Cardiovascular:  Negative for chest pain.  Gastrointestinal:  Negative for abdominal distention, abdominal pain and blood in stool.  Endocrine: Negative for hot flashes.  Genitourinary:  Positive for menstrual problem. Negative for difficulty urinating and frequency.   Musculoskeletal:  Negative for arthralgias.  Skin:  Negative for itching and rash.  Neurological:  Negative for extremity weakness.  Hematological:  Negative for adenopathy.  Psychiatric/Behavioral:  Negative for confusion.     MEDICAL HISTORY:  Past Medical History:  Diagnosis Date   Anemia    Asthma    exercise induced   Cesarean delivery delivered 03/02/2016   Depression 2014   GERD (gastroesophageal  reflux disease)    Headache    h/o migraines   PTSD (post-traumatic stress disorder) 2014   Thrombocytopenia (HCC)     SURGICAL HISTORY: Past Surgical History:  Procedure Laterality Date   CESAREAN SECTION N/A 03/01/2016   Procedure: CESAREAN SECTION;  Surgeon: Garnette JONETTA Mace, MD;  Location: [facility] ORS;  Service: Obstetrics;  Laterality: N/A;   CESAREAN SECTION N/A 02/11/2020   Procedure: CESAREAN SECTION;  Surgeon: Mace Garnette JONETTA, MD;  Location: [facility] ORS;  Service: Obstetrics;  Laterality: N/A;  RNFA    CESAREAN SECTION WITH BILATERAL TUBAL LIGATION N/A 09/14/2021   Procedure: CESAREAN SECTION WITH BILATERAL TUBAL LIGATION;  Surgeon: Mace Garnette JONETTA, MD;  Location: [facility] ORS;  Service: Obstetrics;  Laterality: N/A;   COLONOSCOPY WITH PROPOFOL  N/A 02/14/2022   Procedure: COLONOSCOPY WITH PROPOFOL ;  Surgeon: Unk Corinn Skiff, MD;  Location: [facility] ENDOSCOPY;  Service: Gastroenterology;  Laterality: N/A;   ESOPHAGOGASTRODUODENOSCOPY (EGD) WITH PROPOFOL  N/A 02/14/2022   Procedure: ESOPHAGOGASTRODUODENOSCOPY (EGD) WITH PROPOFOL ;  Surgeon: Unk Corinn Skiff, MD;  Location: [facility] ENDOSCOPY;  Service: Gastroenterology;  Laterality: N/A;    SOCIAL HISTORY: Social History   Socioeconomic History   Marital status: Married    Spouse name: Dayton   Number of children: 3   Years of education: college   Highest education level: Not on file  Occupational History   Occupation: Sales executive  Tobacco Use   Smoking status: Never   Smokeless tobacco: Never  Vaping Use   Vaping status: Never Used  Substance and Sexual Activity   Alcohol use: No   Drug use: No   Sexual activity: Yes    Partners: Male    Birth control/protection: Surgical  Other Topics Concern   Not on file  Social History Narrative   Lives at home with husband and three children.   Right-handed.   One cup caffeine per day.   Social Drivers of Corporate investment banker Strain: Low Risk  (02/04/2024)   Received from [facility]   Overall Financial Resource Strain (CARDIA)    Difficulty of Paying Living Expenses: Not very hard  Food Insecurity: Food Insecurity Present (02/04/2024)   Received from [facility]   Hunger Vital Sign    Within the past 12 months, you worried that your food would run out before you got the money to buy more.: Sometimes true    Within the past 12 months, the food you bought just didn't last and you didn't have money to get more.: Sometimes true  Transportation Needs: No Transportation Needs  (02/04/2024)   Received from [facility]   PRAPARE - Transportation    Lack of Transportation (Medical): No    Lack of Transportation (Non-Medical): No  Physical Activity: Not on file  Stress: Not on file  Social Connections: Not on file  Intimate Partner Violence: Not At Risk (10/30/2022)   Received from [facility]   Humiliation, Afraid, Rape, and Kick questionnaire    Within the last year, have you been afraid of your partner or ex-partner?: No    Within the last year, have you been humiliated or emotionally abused in other ways by your partner or ex-partner?: No    Within the last year, have you been kicked, hit, slapped, or otherwise physically hurt by your partner or ex-partner?: No    Within the last year, have you been raped or forced to have any kind of sexual activity by your partner or ex-partner?: No  FAMILY HISTORY: Family History  Problem Relation Age of Onset   Healthy Mother    Healthy Father    Cancer - Colon Maternal Grandfather    Breast cancer Paternal Grandmother        72s   Breast cancer Maternal Aunt        not sure of age    ALLERGIES:  has no known allergies.  MEDICATIONS:  Current Outpatient Medications  Medication Sig Dispense Refill   albuterol  (VENTOLIN  HFA) 108 (90 Base) MCG/ACT inhaler Inhale into the lungs.     fluticasone  (FLOVENT  HFA) 110 MCG/ACT inhaler SMARTSIG:2 Puff(s) By Mouth Twice Daily     pantoprazole (PROTONIX) 20 MG tablet Take 1 tablet by mouth daily.     Cholecalciferol (VITAMIN D3) 25 MCG (1000 UT) CAPS Take by mouth.     Cyanocobalamin  (B-12) 2500 MCG SUBL Place 1 tablet under the tongue daily. 30 tablet 2   fluticasone  (FLONASE ) 50 MCG/ACT nasal spray SHAKE LIQUID AND USE 2 SPRAYS IN EACH NOSTRIL DAILY     gabapentin (NEURONTIN) 300 MG capsule Take 300 mg by mouth daily.     No current facility-administered medications for this visit.     PHYSICAL EXAMINATION:  Vitals:   05/12/24 1407  BP: 114/69  Pulse: 80   Resp: 16  Temp: 97.6 F (36.4 C)  SpO2: 98%   Filed Weights   05/12/24 1407  Weight: 132 lb (59.9 kg)    Physical Exam Constitutional:      General: She is not in acute distress.    Comments: Patient ambulates independently  HENT:     Head: Normocephalic and atraumatic.  Eyes:     General: No scleral icterus. Cardiovascular:     Rate and Rhythm: Normal rate.  Pulmonary:     Effort: Pulmonary effort is normal. No respiratory distress.  Abdominal:     General: There is no distension.     Palpations: Abdomen is soft.  Musculoskeletal:        General: No deformity. Normal range of motion.     Cervical back: Normal range of motion and neck supple.  Skin:    General: Skin is warm and dry.     Findings: No erythema or rash.  Neurological:     Mental Status: She is alert and oriented to person, place, and time. Mental status is at baseline.     Cranial Nerves: No cranial nerve deficit.  Psychiatric:        Mood and Affect: Mood normal.      LABORATORY DATA:  I have reviewed the data as listed Lab Results  Component Value Date   WBC 3.6 (L) 05/10/2024   HGB 11.4 (L) 05/10/2024   HCT 34.8 (L) 05/10/2024   MCV 88.5 05/10/2024   PLT 110 (L) 05/10/2024   No results for input(s): NA, K, CL, CO2, GLUCOSE, BUN, CREATININE, CALCIUM , GFRNONAA, GFRAA, PROT, ALBUMIN, AST, ALT, ALKPHOS, BILITOT, BILIDIR, IBILI in the last 8760 hours. Iron /TIBC/Ferritin/ %Sat    Component Value Date/Time   IRON  78 05/10/2024 0854   TIBC 377 05/10/2024 0854   FERRITIN 7 (L) 05/10/2024 0854   IRONPCTSAT 21 05/10/2024 0854      RADIOGRAPHIC STUDIES: I have personally reviewed the radiological images as listed and agreed with the findings in the report.  No results found.

## 2024-05-12 NOTE — Assessment & Plan Note (Addendum)
 Pernicious anemia  parietal Cell AntibodyIgG + intrinsic antibody +  EGD in May 2023 by Dr. Unk. Has been on B12 1000mcg injection monthly. Most recent injection she developed local bruising.   Recommend patient to take sublingual B12 2500mcg daily.

## 2024-05-12 NOTE — Assessment & Plan Note (Addendum)
 Iron  deficiency anemia  Labs are reviewed and discussed with patient. Lab Results  Component Value Date   HGB 11.4 (L) 05/10/2024   TIBC 377 05/10/2024   IRONPCTSAT 21 05/10/2024   FERRITIN 7 (L) 05/10/2024     Recommend Venofer  weekly x 3.

## 2024-05-12 NOTE — Assessment & Plan Note (Addendum)
 Her lab work up is consistent with von willebrand diease.  VWF activity antigen ratio <0.7, with normal multimer levels, likely Type 76M Recommend DDAVP trial. Currently her menorrhagia has largely improved after birth control patch use. VWD activity and antigen level have improved as well.  Previously she would like to hold off DDAVP trial, she is interested now.  check : VW genetic profile [id number] and Factor VIII genetic profile [ 630619] results are pending.

## 2024-05-12 NOTE — Patient Instructions (Signed)

## 2024-05-19 ENCOUNTER — Inpatient Hospital Stay: Attending: Oncology

## 2024-05-19 VITALS — BP 133/71 | HR 82 | Temp 95.1°F | Resp 18

## 2024-05-19 DIAGNOSIS — N921 Excessive and frequent menstruation with irregular cycle: Secondary | ICD-10-CM | POA: Diagnosis not present

## 2024-05-19 DIAGNOSIS — D500 Iron deficiency anemia secondary to blood loss (chronic): Secondary | ICD-10-CM | POA: Insufficient documentation

## 2024-05-19 DIAGNOSIS — E538 Deficiency of other specified B group vitamins: Secondary | ICD-10-CM

## 2024-05-19 MED ORDER — IRON SUCROSE 20 MG/ML IV SOLN
200.0000 mg | Freq: Once | INTRAVENOUS | Status: AC
Start: 2024-05-19 — End: 2024-05-19
  Administered 2024-05-19: 200 mg via INTRAVENOUS
  Filled 2024-05-19: qty 10

## 2024-05-19 NOTE — Patient Instructions (Signed)

## 2024-05-26 ENCOUNTER — Inpatient Hospital Stay

## 2024-05-26 VITALS — BP 113/66 | HR 70 | Temp 97.8°F | Resp 17

## 2024-05-26 DIAGNOSIS — E538 Deficiency of other specified B group vitamins: Secondary | ICD-10-CM

## 2024-05-26 DIAGNOSIS — D500 Iron deficiency anemia secondary to blood loss (chronic): Secondary | ICD-10-CM | POA: Diagnosis not present

## 2024-05-26 MED ORDER — IRON SUCROSE 20 MG/ML IV SOLN
200.0000 mg | Freq: Once | INTRAVENOUS | Status: AC
  Administered 2024-05-26 (×2): 200 mg via INTRAVENOUS
  Filled 2024-05-26: qty 10

## 2024-05-26 MED ORDER — SODIUM CHLORIDE 0.9% FLUSH
10.0000 mL | Freq: Once | INTRAVENOUS | Status: AC | PRN
  Administered 2024-05-26 (×2): 10 mL
  Filled 2024-05-26: qty 10

## 2024-05-26 NOTE — Progress Notes (Signed)
 Patient tolerated Venofer  infusion well. Explained recommendation of 30 min post monitoring. Patient refused to wait post monitoring. Educated on what signs to watch for & to call with any concerns. No questions, discharged. Stable

## 2024-05-26 NOTE — Patient Instructions (Signed)

## 2024-06-02 ENCOUNTER — Inpatient Hospital Stay

## 2024-06-02 VITALS — BP 113/69 | HR 70 | Resp 16

## 2024-06-02 DIAGNOSIS — D500 Iron deficiency anemia secondary to blood loss (chronic): Secondary | ICD-10-CM | POA: Diagnosis not present

## 2024-06-02 DIAGNOSIS — E538 Deficiency of other specified B group vitamins: Secondary | ICD-10-CM

## 2024-06-02 MED ORDER — IRON SUCROSE 20 MG/ML IV SOLN
200.0000 mg | Freq: Once | INTRAVENOUS | Status: AC
  Administered 2024-06-02: 200 mg via INTRAVENOUS
  Filled 2024-06-02: qty 10

## 2024-06-09 ENCOUNTER — Encounter: Payer: Self-pay | Admitting: Oncology

## 2024-06-11 LAB — MISC LABCORP TEST (SEND OUT): Labcorp test code: 630468

## 2024-06-22 LAB — MISC LABCORP TEST (SEND OUT): Labcorp test code: 630619
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
## 2024-11-15 ENCOUNTER — Other Ambulatory Visit

## 2024-11-17 ENCOUNTER — Ambulatory Visit: Admitting: Oncology

## 2024-11-17 ENCOUNTER — Ambulatory Visit

## 2024-12-10 ENCOUNTER — Inpatient Hospital Stay

## 2024-12-13 ENCOUNTER — Inpatient Hospital Stay

## 2024-12-13 ENCOUNTER — Inpatient Hospital Stay: Admitting: Oncology
